# Patient Record
Sex: Female | Born: 1945
Health system: Southern US, Community
[De-identification: ages and names within clinical notes are randomized; demographics above are authoritative.]

## PROBLEM LIST (undated history)

## (undated) DIAGNOSIS — E785 Hyperlipidemia, unspecified: Secondary | ICD-10-CM

## (undated) DIAGNOSIS — J449 Chronic obstructive pulmonary disease, unspecified: Secondary | ICD-10-CM

## (undated) DIAGNOSIS — Z87891 Personal history of nicotine dependence: Secondary | ICD-10-CM

## (undated) DIAGNOSIS — K2270 Barrett's esophagus without dysplasia: Secondary | ICD-10-CM

## (undated) DIAGNOSIS — Z87730 Personal history of (corrected) cleft lip and palate: Secondary | ICD-10-CM

## (undated) DIAGNOSIS — M8580 Other specified disorders of bone density and structure, unspecified site: Secondary | ICD-10-CM

## (undated) DIAGNOSIS — K219 Gastro-esophageal reflux disease without esophagitis: Secondary | ICD-10-CM

## (undated) DIAGNOSIS — K5790 Diverticulosis of intestine, part unspecified, without perforation or abscess without bleeding: Secondary | ICD-10-CM

## (undated) DIAGNOSIS — I6529 Occlusion and stenosis of unspecified carotid artery: Secondary | ICD-10-CM

## (undated) DIAGNOSIS — E113299 Type 2 diabetes mellitus with mild nonproliferative diabetic retinopathy without macular edema, unspecified eye: Secondary | ICD-10-CM

## (undated) DIAGNOSIS — I10 Essential (primary) hypertension: Secondary | ICD-10-CM

## (undated) DIAGNOSIS — E119 Type 2 diabetes mellitus without complications: Secondary | ICD-10-CM

## (undated) DIAGNOSIS — H905 Unspecified sensorineural hearing loss: Secondary | ICD-10-CM

## (undated) DIAGNOSIS — D126 Benign neoplasm of colon, unspecified: Secondary | ICD-10-CM

## (undated) DIAGNOSIS — D128 Benign neoplasm of rectum: Secondary | ICD-10-CM

## (undated) HISTORY — DX: Diverticulosis of intestine, part unspecified, without perforation or abscess without bleeding: K57.90

## (undated) HISTORY — DX: Type 2 diabetes mellitus with mild nonproliferative diabetic retinopathy without macular edema, unspecified eye: E11.3299

## (undated) HISTORY — PX: EYE SURGERY: SHX253

## (undated) HISTORY — DX: Chronic obstructive pulmonary disease, unspecified: J44.9

## (undated) HISTORY — DX: Type 2 diabetes mellitus without complications: E11.9

## (undated) HISTORY — DX: Personal history of nicotine dependence: Z87.891

## (undated) HISTORY — PX: BLADDER SUSPENSION: SHX72

## (undated) HISTORY — DX: Unspecified sensorineural hearing loss: H90.5

## (undated) HISTORY — DX: Other specified disorders of bone density and structure, unspecified site: M85.80

## (undated) HISTORY — PX: TUBAL LIGATION: SHX77

## (undated) HISTORY — DX: Personal history of (corrected) cleft lip and palate: Z87.730

## (undated) HISTORY — DX: Benign neoplasm of rectum: D12.8

## (undated) HISTORY — PX: TONSILLECTOMY AND ADENOIDECTOMY: SUR1326

## (undated) HISTORY — DX: Occlusion and stenosis of unspecified carotid artery: I65.29

## (undated) HISTORY — DX: Hyperlipidemia, unspecified: E78.5

## (undated) HISTORY — DX: Barrett's esophagus without dysplasia: K22.70

## (undated) HISTORY — DX: Gastro-esophageal reflux disease without esophagitis: K21.9

## (undated) HISTORY — DX: Essential (primary) hypertension: I10

---

## 2004-02-10 ENCOUNTER — Ambulatory Visit: Payer: Self-pay | Admitting: Family Medicine

## 2004-09-23 DIAGNOSIS — K5790 Diverticulosis of intestine, part unspecified, without perforation or abscess without bleeding: Secondary | ICD-10-CM

## 2004-09-23 DIAGNOSIS — D128 Benign neoplasm of rectum: Secondary | ICD-10-CM

## 2004-09-23 HISTORY — DX: Diverticulosis of intestine, part unspecified, without perforation or abscess without bleeding: K57.90

## 2004-09-23 HISTORY — DX: Benign neoplasm of rectum: D12.8

## 2004-10-08 ENCOUNTER — Ambulatory Visit: Payer: Self-pay | Admitting: Internal Medicine

## 2004-10-08 HISTORY — PX: COLONOSCOPY: SHX174

## 2005-02-14 ENCOUNTER — Ambulatory Visit: Payer: Self-pay | Admitting: Family Medicine

## 2006-03-14 ENCOUNTER — Ambulatory Visit: Payer: Self-pay | Admitting: Family Medicine

## 2007-03-15 ENCOUNTER — Ambulatory Visit: Payer: Self-pay | Admitting: Family Medicine

## 2007-05-07 LAB — POTASSIUM: Potassium: 4.7 mmol/L

## 2007-05-07 LAB — GLUCOSE, FASTING: Glucose: 99

## 2007-05-07 LAB — LIPID PANEL
Cholesterol, Total: 202
Triglyceride fasting, serum: 238

## 2007-05-07 LAB — CREATININE, SERUM: Creat: 1.02

## 2008-02-19 ENCOUNTER — Emergency Department: Payer: Self-pay | Admitting: Emergency Medicine

## 2011-04-26 HISTORY — PX: CATARACT EXTRACTION: SUR2

## 2011-10-05 DIAGNOSIS — H25049 Posterior subcapsular polar age-related cataract, unspecified eye: Secondary | ICD-10-CM | POA: Diagnosis not present

## 2011-10-31 DIAGNOSIS — H25049 Posterior subcapsular polar age-related cataract, unspecified eye: Secondary | ICD-10-CM | POA: Diagnosis not present

## 2011-11-01 ENCOUNTER — Ambulatory Visit: Payer: Self-pay | Admitting: Ophthalmology

## 2011-11-01 DIAGNOSIS — Z01810 Encounter for preprocedural cardiovascular examination: Secondary | ICD-10-CM | POA: Diagnosis not present

## 2011-11-01 DIAGNOSIS — I499 Cardiac arrhythmia, unspecified: Secondary | ICD-10-CM | POA: Diagnosis not present

## 2011-11-01 DIAGNOSIS — R9431 Abnormal electrocardiogram [ECG] [EKG]: Secondary | ICD-10-CM | POA: Diagnosis not present

## 2011-11-07 ENCOUNTER — Ambulatory Visit: Payer: Self-pay | Admitting: Ophthalmology

## 2011-11-07 DIAGNOSIS — H259 Unspecified age-related cataract: Secondary | ICD-10-CM | POA: Diagnosis not present

## 2011-11-07 DIAGNOSIS — E119 Type 2 diabetes mellitus without complications: Secondary | ICD-10-CM | POA: Diagnosis not present

## 2011-11-07 DIAGNOSIS — Z79899 Other long term (current) drug therapy: Secondary | ICD-10-CM | POA: Diagnosis not present

## 2011-11-07 DIAGNOSIS — H25049 Posterior subcapsular polar age-related cataract, unspecified eye: Secondary | ICD-10-CM | POA: Diagnosis not present

## 2011-11-07 DIAGNOSIS — Z87891 Personal history of nicotine dependence: Secondary | ICD-10-CM | POA: Diagnosis not present

## 2011-11-22 ENCOUNTER — Ambulatory Visit (INDEPENDENT_AMBULATORY_CARE_PROVIDER_SITE_OTHER): Payer: Medicare Other | Admitting: Family Medicine

## 2011-11-22 ENCOUNTER — Encounter: Payer: Self-pay | Admitting: Family Medicine

## 2011-11-22 VITALS — BP 136/78 | HR 112 | Temp 98.2°F | Ht 65.0 in | Wt 123.2 lb

## 2011-11-22 DIAGNOSIS — E118 Type 2 diabetes mellitus with unspecified complications: Secondary | ICD-10-CM | POA: Insufficient documentation

## 2011-11-22 DIAGNOSIS — E1169 Type 2 diabetes mellitus with other specified complication: Secondary | ICD-10-CM | POA: Insufficient documentation

## 2011-11-22 DIAGNOSIS — F17200 Nicotine dependence, unspecified, uncomplicated: Secondary | ICD-10-CM | POA: Diagnosis not present

## 2011-11-22 DIAGNOSIS — R634 Abnormal weight loss: Secondary | ICD-10-CM

## 2011-11-22 DIAGNOSIS — E1165 Type 2 diabetes mellitus with hyperglycemia: Secondary | ICD-10-CM | POA: Insufficient documentation

## 2011-11-22 DIAGNOSIS — E785 Hyperlipidemia, unspecified: Secondary | ICD-10-CM | POA: Diagnosis not present

## 2011-11-22 LAB — POCT CBG (FASTING - GLUCOSE)-MANUAL ENTRY: Glucose Fasting, POC: 345 mg/dL — AB (ref 70–99)

## 2011-11-22 MED ORDER — METFORMIN HCL 500 MG PO TABS: 500.0000 mg | ORAL_TABLET | Freq: Two times a day (BID) | ORAL | Status: DC

## 2011-11-22 NOTE — Assessment & Plan Note (Signed)
Encouraged continued smoking cessation 

## 2011-11-22 NOTE — Progress Notes (Signed)
Subjective:    Patient ID: Cheryl Bird, female    DOB: [birth year], 66 y.o.   MRN: [id number]  HPI CC: new pt to establish  Presents with daughter, Cheryl Bird.  Prior saw [facility].  Started with medicare at age 59yo.  Recent cataract surgery last week, told sugars were 306  DM - longtime diagnosis.  Doesn't check sugars.  Last eye doctor appt was Wednesday - [place] (Recent cataract surgery).  No retinopathy.  No paresthesias.  Has glucometer at home.  Prior only on glucovance with good control, however has been off meds for quite some time.  Smoking - 1/3 ppd.  Has been cutting down.  Prior was smoking 1+ ppd.  Started smoking 50 yrs ago.  Keeps cough.  Denies SOB, trouble breathing.  Recent weight loss - over last 2 years about 30 lbs.  Hasn't been trying but has been more active, volunteering at flea market.  Not up to date on preventative care  Did have falls in last year- but attributes to recent cataract. Denies depression, anhedonia.  No h/o HTN. HLD - told to control with diet.  Preventative: Last CPE thinks 2008. Last mammo ~2008, normal Last pap smear - unsure, told always normal and could stop. Colonoscopy - done about 2008, normal.  Had polyp, but normal.  Medications and allergies reviewed and updated in chart.  Past histories reviewed and updated if relevant as below. There is no problem list on file for this patient.  Past Medical History  Diagnosis Date  . Diabetes type 2, uncontrolled   . HLD (hyperlipidemia)   . Smoker   . Right-sided sensorineural hearing loss     seen by Dr. Andee Poles   Past Surgical History  Procedure Date  . Bladder surgery     sling  . Cataract extraction 2013    Right   History  Substance Use Topics  . Smoking status: Current Everyday Smoker -- 0.3 packs/day    Types: Cigarettes  . Smokeless tobacco: Never Used  . Alcohol Use: No   Family History  Problem Relation Age of Onset  . Diabetes Maternal Aunt   .  Alcohol abuse Mother   . Heart disease Mother     enlarged heart  . Kidney failure Mother   . Hypertension Mother   . Coronary artery disease Father 26  . Coronary artery disease Brother 55  . Cancer Neg Hx   . Stroke Neg Hx    Allergies  Allergen Reactions  . Aleve (Naproxen Sodium) Itching   No current outpatient prescriptions on file prior to visit.    Review of Systems  Constitutional: Positive for unexpected weight change (weight loss). Negative for fever, chills, activity change, appetite change and fatigue.  HENT: Negative for hearing loss and neck pain.   Eyes: Negative for visual disturbance.  Respiratory: Negative for cough, chest tightness, shortness of breath and wheezing.   Cardiovascular: Negative for chest pain, palpitations and leg swelling.  Gastrointestinal: Negative for nausea, vomiting, abdominal pain, diarrhea, constipation, blood in stool and abdominal distention.  Genitourinary: Negative for hematuria and difficulty urinating.  Musculoskeletal: Negative for myalgias and arthralgias.  Skin: Negative for rash.  Neurological: Negative for dizziness, seizures, syncope and headaches.  Hematological: Does not bruise/bleed easily.  Psychiatric/Behavioral: Negative for dysphoric mood. The patient is not nervous/anxious.        Objective:   Physical Exam  Nursing note and vitals reviewed. Constitutional: She is oriented to person, place, and time. She appears well-developed  and well-nourished. No distress.  HENT:  Head: Normocephalic and atraumatic.  Right Ear: Hearing, tympanic membrane, external ear and ear canal normal.  Left Ear: Hearing, tympanic membrane, external ear and ear canal normal.  Nose: Nose normal. No mucosal edema or rhinorrhea.  Mouth/Throat: Uvula is midline, oropharynx is clear and moist and mucous membranes are normal. No oropharyngeal exudate, posterior oropharyngeal edema, posterior oropharyngeal erythema or tonsillar abscesses.  Eyes:  Conjunctivae and EOM are normal. Pupils are equal, round, and reactive to light. No scleral icterus.  Neck: Normal range of motion. Neck supple.  Cardiovascular: Normal rate, regular rhythm, normal heart sounds and intact distal pulses.   No murmur heard. Pulses:      Radial pulses are 2+ on the right side, and 2+ on the left side.  Pulmonary/Chest: Effort normal and breath sounds normal. No respiratory distress. She has no wheezes. She has no rales.  Abdominal: Soft. Bowel sounds are normal. She exhibits no distension and no mass. There is no tenderness. There is no rebound and no guarding.  Musculoskeletal: Normal range of motion.  Lymphadenopathy:    She has no cervical adenopathy.  Neurological: She is alert and oriented to person, place, and time.       CN grossly intact, station and gait intact  Skin: Skin is warm and dry. No rash noted.  Psychiatric: She has a normal mood and affect. Her behavior is normal. Judgment and thought content normal.       Assessment & Plan:

## 2011-11-22 NOTE — Assessment & Plan Note (Signed)
Weight loss of ~20 lbs in last 2 years.  No other constitutional sxs. Pt attributes to increased activity and volunteering. Return for wellness exam to review/schedule preventative care.

## 2011-11-22 NOTE — Assessment & Plan Note (Signed)
Check FLP at next fasting blood work. 

## 2011-11-22 NOTE — Patient Instructions (Addendum)
Keep working on quitting smoking. Sugar check today - your sugar is too high!  345 today. Start metformin at 500mg  nightly for 2 weeks then increase to 500mg  twice a day with meals - will cause stomach upset initially while body gets used to this. Get new battery for glucommeter and start keeping track of sugars. We will set you up with diabetes education. Return in 1 month for follow up of diabetes Return in 1-2 months for medicare wellness visit, prior fasting for blood work.

## 2011-11-22 NOTE — Assessment & Plan Note (Signed)
Chronically uncontrolled. Off meds for months to years. cbg in office 345.  Discussed this as well as dietary changes for better glycemic control.   Check A1c and BMP today. Has glucommeter at home, advised to obtain new battery. Refer to diabetes education as well for further assistance.

## 2011-11-24 LAB — BASIC METABOLIC PANEL
Calcium: 10 mg/dL (ref 8.6–10.2)
Creatinine, Ser: 0.96 mg/dL (ref 0.57–1.00)
GFR calc non Af Amer: 62 mL/min/{1.73_m2} (ref >59)
Sodium: 138 mmol/L (ref 134–144)

## 2011-11-24 LAB — HEMOGLOBIN A1C: Hgb A1c MFr Bld: 13.5 % — ABNORMAL HIGH (ref 4.8–5.6)

## 2011-11-27 ENCOUNTER — Encounter: Payer: Self-pay | Admitting: Family Medicine

## 2011-12-02 ENCOUNTER — Encounter: Payer: Self-pay | Admitting: Family Medicine

## 2011-12-15 ENCOUNTER — Other Ambulatory Visit: Payer: Self-pay | Admitting: Family Medicine

## 2011-12-15 DIAGNOSIS — E1165 Type 2 diabetes mellitus with hyperglycemia: Secondary | ICD-10-CM

## 2011-12-15 DIAGNOSIS — IMO0002 Reserved for concepts with insufficient information to code with codable children: Secondary | ICD-10-CM

## 2011-12-15 DIAGNOSIS — E785 Hyperlipidemia, unspecified: Secondary | ICD-10-CM

## 2011-12-16 ENCOUNTER — Ambulatory Visit: Payer: Self-pay | Admitting: Family Medicine

## 2011-12-16 DIAGNOSIS — E119 Type 2 diabetes mellitus without complications: Secondary | ICD-10-CM | POA: Diagnosis not present

## 2011-12-16 DIAGNOSIS — Z7189 Other specified counseling: Secondary | ICD-10-CM | POA: Diagnosis not present

## 2011-12-20 ENCOUNTER — Other Ambulatory Visit (INDEPENDENT_AMBULATORY_CARE_PROVIDER_SITE_OTHER): Payer: Medicare Other

## 2011-12-20 DIAGNOSIS — E1165 Type 2 diabetes mellitus with hyperglycemia: Secondary | ICD-10-CM

## 2011-12-20 DIAGNOSIS — E785 Hyperlipidemia, unspecified: Secondary | ICD-10-CM | POA: Diagnosis not present

## 2011-12-21 ENCOUNTER — Encounter: Payer: Self-pay | Admitting: Family Medicine

## 2011-12-21 LAB — COMPREHENSIVE METABOLIC PANEL
ALT: 8 IU/L (ref 0–32)
AST: 8 IU/L (ref 0–40)
Albumin/Globulin Ratio: 1.5 (ref 1.1–2.5)
Alkaline Phosphatase: 71 IU/L (ref 47–112)
GFR calc Af Amer: 99 mL/min/{1.73_m2} (ref >59)
GFR calc non Af Amer: 86 mL/min/{1.73_m2} (ref >59)
Potassium: 4.1 mmol/L (ref 3.5–5.2)
Sodium: 138 mmol/L (ref 134–144)
Total Bilirubin: 0.2 mg/dL (ref 0.0–1.2)

## 2011-12-21 LAB — LIPID PANEL: Chol/HDL Ratio: 4.7 ratio units — ABNORMAL HIGH (ref 0.0–4.4)

## 2011-12-21 LAB — MICROALBUMIN / CREATININE URINE RATIO
Creatinine, Ur: 96.4 mg/dL (ref 15.0–278.0)
Microalb Creat Ratio: 48.7 mg/g creat — ABNORMAL HIGH (ref 0.0–30.0)

## 2011-12-23 ENCOUNTER — Encounter: Payer: Self-pay | Admitting: Family Medicine

## 2011-12-23 ENCOUNTER — Ambulatory Visit (INDEPENDENT_AMBULATORY_CARE_PROVIDER_SITE_OTHER): Payer: Medicare Other | Admitting: Family Medicine

## 2011-12-23 ENCOUNTER — Ambulatory Visit (INDEPENDENT_AMBULATORY_CARE_PROVIDER_SITE_OTHER)
Admission: RE | Admit: 2011-12-23 | Discharge: 2011-12-23 | Disposition: A | Discharge status: Home / Self Care | Payer: Medicare Other | Source: Ambulatory Visit | Attending: Family Medicine | Admitting: Family Medicine

## 2011-12-23 ENCOUNTER — Other Ambulatory Visit (HOSPITAL_COMMUNITY)
Admission: RE | Admit: 2011-12-23 | Discharge: 2011-12-23 | Disposition: A | Discharge status: Home / Self Care | Payer: Medicare Other | Source: Ambulatory Visit | Attending: Family Medicine | Admitting: Family Medicine

## 2011-12-23 VITALS — BP 130/84 | HR 102 | Temp 99.1°F | Wt 120.2 lb

## 2011-12-23 DIAGNOSIS — Z23 Encounter for immunization: Secondary | ICD-10-CM | POA: Diagnosis not present

## 2011-12-23 DIAGNOSIS — Z0000 Encounter for general adult medical examination without abnormal findings: Secondary | ICD-10-CM | POA: Diagnosis not present

## 2011-12-23 DIAGNOSIS — Z1211 Encounter for screening for malignant neoplasm of colon: Secondary | ICD-10-CM | POA: Diagnosis not present

## 2011-12-23 DIAGNOSIS — N760 Acute vaginitis: Secondary | ICD-10-CM | POA: Diagnosis not present

## 2011-12-23 DIAGNOSIS — E785 Hyperlipidemia, unspecified: Secondary | ICD-10-CM | POA: Diagnosis not present

## 2011-12-23 DIAGNOSIS — R05 Cough: Secondary | ICD-10-CM | POA: Diagnosis not present

## 2011-12-23 DIAGNOSIS — R634 Abnormal weight loss: Secondary | ICD-10-CM

## 2011-12-23 DIAGNOSIS — Z1231 Encounter for screening mammogram for malignant neoplasm of breast: Secondary | ICD-10-CM

## 2011-12-23 DIAGNOSIS — Z124 Encounter for screening for malignant neoplasm of cervix: Secondary | ICD-10-CM | POA: Insufficient documentation

## 2011-12-23 DIAGNOSIS — F17200 Nicotine dependence, unspecified, uncomplicated: Secondary | ICD-10-CM | POA: Diagnosis not present

## 2011-12-23 DIAGNOSIS — E1165 Type 2 diabetes mellitus with hyperglycemia: Secondary | ICD-10-CM

## 2011-12-23 DIAGNOSIS — Z1151 Encounter for screening for human papillomavirus (HPV): Secondary | ICD-10-CM | POA: Diagnosis not present

## 2011-12-23 MED ORDER — LISINOPRIL 5 MG PO TABS: 5.0000 mg | ORAL_TABLET | Freq: Every day | ORAL | Status: DC

## 2011-12-23 NOTE — Patient Instructions (Addendum)
Increase metformin to 1 pill in am and 2 pills at night for 2 weeks then increase to 2 pills in am and 2 pills at night (1000mg  twice a day).  New prescription will be 1000mg  so you will just need to take one at a time. Start lisinopril at 5mg  daily - return 1 week after starting for recheck blood work. Call your insurance about the shingles shot to see if it is covered or how much it would cost and where is cheaper (here or pharmacy).  If you want to receive here, call for nurse visit. Pass by Marion's office to schedule mammogram. Tetanus and pneumonia shots today. Good to see you today, call us with questions. xray today.  Stool kit otdya.

## 2011-12-23 NOTE — Progress Notes (Signed)
Subjective:    Patient ID: Cheryl Bird, female    DOB: 1945/09/06, 66 y.o.   MRN: [id number]  HPI CC: medicare wellness visit  DM - sugars slowly coming down.  Fasting 220s (prior 300s).  Compliant with Metformin.  Last eye exam (11/2011).  Cataract surgery 10/2011.  Sees Dr. Dorcas Mcmurray.  No low sugars but doesn't feel well when sugars 170s (fatiged, weak).  Smoking - down to 1/3 ppd.  Cutting down (prior 1/2 ppd).  No depression/anhedonia, sadness. Has had 2 falls, tripped over feet.  No serious injury.  Preventative:  No CPE in 5+ yrs. Last mammo ~2008, normal  Last pap smear - unsure, told always normal and could stop.  Will check today. Colonoscopy - done about 2008, normal. Had polyp, but normal per pt.  Done at [facility]. Tetanus shot - would like today.  Last done about 10 yrs ago. Pneumonia shot - would like today. Flu shot - recommended this season. Shingles shot - discussed. Advanced directives: pt aware of this.  Has advanced directive made but not notarized.  Would want HCPOA to be daughter.  Son in Social worker is Environmental manager of finances.  Does want to stay on if reversible, otherwise doesn't want prolonged life support.  Has spoken with daughter/son in law about this.  Caffeine: 2 cups coffee/day Occupation: retired Edu: HS Activity: walks daily Diet: good water, fruits/vegetables daily  Wt Readings from Last 3 Encounters:  12/23/11 120 lb 4 oz (54.545 kg)  11/22/11 123 lb 4 oz (55.906 kg)    Medications and allergies reviewed and updated in chart.  Past histories reviewed and updated if relevant as below. Patient Active Problem List  Diagnosis  . Diabetes type 2, uncontrolled  . HLD (hyperlipidemia)  . Smoker  . Weight loss   Past Medical History  Diagnosis Date  . Diabetes type 2, uncontrolled     DMSE @ [facility] 11/2011  . HLD (hyperlipidemia)   . Smoker   . Right-sided sensorineural hearing loss     seen by Dr. Andee Poles  . HTN (hypertension)     dx per prior  records   Past Surgical History  Procedure Date  . Bladder surgery     sling  . Cataract extraction 2013    Right  . Tubal ligation   . Tonsillectomy and adenoidectomy    History  Substance Use Topics  . Smoking status: Current Everyday Smoker -- 0.3 packs/day    Types: Cigarettes  . Smokeless tobacco: Never Used  . Alcohol Use: No   Family History  Problem Relation Age of Onset  . Diabetes Maternal Aunt   . Alcohol abuse Mother   . Heart disease Mother     enlarged heart  . Kidney failure Mother   . Hypertension Mother   . Coronary artery disease Father 73  . Coronary artery disease Brother 29  . Cancer Sister     kidney   . Stroke Neg Hx    Allergies  Allergen Reactions  . Aleve (Naproxen Sodium) Itching   Current Outpatient Prescriptions on File Prior to Visit  Medication Sig Dispense Refill  . DISCONTD: metFORMIN (GLUCOPHAGE) 500 MG tablet Take 1 tablet (500 mg total) by mouth 2 (two) times daily with a meal. Start with nightly for 2 weeks then increase to bid with meal  180 tablet  3     Review of Systems  Constitutional: Positive for unexpected weight change (weight loss). Negative for fever, chills, activity change, appetite change and  fatigue.  HENT: Negative for hearing loss and neck pain.   Eyes: Negative for visual disturbance.  Respiratory: Negative for cough, chest tightness, shortness of breath and wheezing.   Cardiovascular: Negative for chest pain, palpitations and leg swelling.  Gastrointestinal: Negative for nausea, vomiting, abdominal pain, diarrhea, constipation, blood in stool and abdominal distention.  Genitourinary: Negative for hematuria and difficulty urinating.  Musculoskeletal: Negative for myalgias and arthralgias.  Skin: Negative for rash.  Neurological: Negative for dizziness, seizures, syncope and headaches.  Hematological: Does not bruise/bleed easily.  Psychiatric/Behavioral: Negative for dysphoric mood. The patient is not  nervous/anxious.        Objective:   Physical Exam  Nursing note and vitals reviewed. Constitutional: She is oriented to person, place, and time. She appears well-developed and well-nourished. No distress.  HENT:  Head: Normocephalic and atraumatic.  Right Ear: External ear normal.  Left Ear: External ear normal.  Nose: Nose normal.  Mouth/Throat: Oropharynx is clear and moist. No oropharyngeal exudate.  Eyes: Conjunctivae and EOM are normal. Pupils are equal, round, and reactive to light. No scleral icterus.  Neck: Normal range of motion. Neck supple.  Cardiovascular: Normal rate, regular rhythm, normal heart sounds and intact distal pulses.   No murmur heard. Pulses:      Radial pulses are 2+ on the right side, and 2+ on the left side.  Pulmonary/Chest: Effort normal and breath sounds normal. No respiratory distress. She has no wheezes. She has no rales. Right breast exhibits no inverted nipple, no mass, no nipple discharge, no skin change and no tenderness. Left breast exhibits no inverted nipple, no mass, no nipple discharge, no skin change and no tenderness. Breasts are symmetrical.  Abdominal: Soft. Bowel sounds are normal. She exhibits no distension and no mass. There is no tenderness. There is no rebound and no guarding.  Genitourinary: Vagina normal and uterus normal. Pelvic exam was performed with patient supine. No labial fusion. There is no rash, tenderness, lesion or injury on the right labia. There is no rash, tenderness, lesion or injury on the left labia. Uterus is not deviated, not enlarged, not fixed and not tender. Cervix exhibits no motion tenderness, no discharge and no friability. Right adnexum displays no mass, no tenderness and no fullness. Left adnexum displays no mass, no tenderness and no fullness. No erythema, tenderness or bleeding around the vagina. No foreign body (stitch present) around the vagina. No signs of injury around the vagina. No vaginal discharge found.   Musculoskeletal: Normal range of motion. She exhibits no edema.       Diabetic foot exam: Normal inspection No skin breakdown No calluses  Normal DP/PT pulses Normal sensation to light touch and monofilament Nails normal    Lymphadenopathy:    She has no cervical adenopathy.    She has no axillary adenopathy.       Right axillary: No lateral adenopathy present.       Left axillary: No lateral adenopathy present.      Right: No supraclavicular adenopathy present.       Left: No supraclavicular adenopathy present.  Neurological: She is alert and oriented to person, place, and time.       CN grossly intact, station and gait intact  Skin: Skin is warm and dry. No rash noted.  Psychiatric: She has a normal mood and affect. Her behavior is normal. Judgment and thought content normal.       Assessment & Plan:

## 2011-12-24 ENCOUNTER — Encounter: Payer: Self-pay | Admitting: Family Medicine

## 2011-12-24 DIAGNOSIS — Z0000 Encounter for general adult medical examination without abnormal findings: Secondary | ICD-10-CM | POA: Insufficient documentation

## 2011-12-24 NOTE — Assessment & Plan Note (Signed)
Uncontrolled. May need to start statin although will start 1 med at a time.

## 2011-12-24 NOTE — Assessment & Plan Note (Signed)
Continue to encourage cessation. 

## 2011-12-24 NOTE — Assessment & Plan Note (Signed)
I have personally reviewed the Medicare Annual Wellness questionnaire and have noted 1. The patient's medical and social history 2. Their use of alcohol, tobacco or illicit drugs 3. Their current medications and supplements 4. The patient's functional ability including ADL's, fall risks, home safety risks and hearing or visual impairment. 5. Diet and physical activity 6. Evidence for depression or mood disorders The patients weight, height, BMI have been recorded in the chart.  Hearing and vision has been addressed. I have made referrals, counseling and provided education to the patient based review of the above and I have provided the pt with a written personalized care plan for preventive services. See scanned questionairre. Advanced directives discussed: has discussed with children.  Daughter is HCPOA  Reviewed preventative protocols and updated unless pt declined. Td, pneumovax today. iFOB and mammo scheduled today

## 2011-12-24 NOTE — Assessment & Plan Note (Signed)
3 lb loss - continue to monitor.   Check CXR today in smoker. Sent home with iFOB.

## 2011-12-24 NOTE — Assessment & Plan Note (Signed)
Still uncontrolled.  Suprisingly, no neuropathy or retinopathy currently. Noted microalbuminuria - start lisinopril at 5mg  daily.  bp will tolerate.  rtc 1 wk after starting for Cr check. Increase metformin. Foot exam today. Pt refuses insulin currently. consider addition of januvia if able to afford, o/w sulfonylurea next. Lab Results  Component Value Date   HGBA1C 13.5* 11/22/2011

## 2011-12-25 ENCOUNTER — Ambulatory Visit: Payer: Self-pay | Admitting: Family Medicine

## 2011-12-25 DIAGNOSIS — E119 Type 2 diabetes mellitus without complications: Secondary | ICD-10-CM | POA: Diagnosis not present

## 2011-12-25 DIAGNOSIS — Z713 Dietary counseling and surveillance: Secondary | ICD-10-CM | POA: Diagnosis not present

## 2011-12-30 ENCOUNTER — Other Ambulatory Visit (INDEPENDENT_AMBULATORY_CARE_PROVIDER_SITE_OTHER): Payer: Medicare Other

## 2011-12-30 DIAGNOSIS — IMO0002 Reserved for concepts with insufficient information to code with codable children: Secondary | ICD-10-CM

## 2011-12-30 DIAGNOSIS — E1165 Type 2 diabetes mellitus with hyperglycemia: Secondary | ICD-10-CM

## 2011-12-30 LAB — BASIC METABOLIC PANEL
BUN: 17 mg/dL (ref 6–23)
Calcium: 9.5 mg/dL (ref 8.4–10.5)
Creatinine, Ser: 1 mg/dL (ref 0.4–1.2)
GFR: 62.49 mL/min (ref >60.00)

## 2011-12-31 ENCOUNTER — Other Ambulatory Visit: Payer: Self-pay | Admitting: Family Medicine

## 2011-12-31 DIAGNOSIS — R634 Abnormal weight loss: Secondary | ICD-10-CM

## 2011-12-31 DIAGNOSIS — E1165 Type 2 diabetes mellitus with hyperglycemia: Secondary | ICD-10-CM

## 2012-01-02 ENCOUNTER — Encounter: Payer: Self-pay | Admitting: Family Medicine

## 2012-01-05 ENCOUNTER — Other Ambulatory Visit (INDEPENDENT_AMBULATORY_CARE_PROVIDER_SITE_OTHER): Payer: Medicare Other

## 2012-01-05 DIAGNOSIS — R634 Abnormal weight loss: Secondary | ICD-10-CM

## 2012-01-05 DIAGNOSIS — E1165 Type 2 diabetes mellitus with hyperglycemia: Secondary | ICD-10-CM

## 2012-01-05 LAB — BASIC METABOLIC PANEL
Calcium: 9.3 mg/dL (ref 8.4–10.5)
GFR: 68.26 mL/min (ref >60.00)
Sodium: 139 mEq/L (ref 135–145)

## 2012-01-05 LAB — CBC WITH DIFFERENTIAL/PLATELET
Basophils Absolute: 0 10*3/uL (ref 0.0–0.1)
Hemoglobin: 13.4 g/dL (ref 12.0–15.0)
Lymphocytes Relative: 31.1 % (ref 12.0–46.0)
Monocytes Relative: 7.4 % (ref 3.0–12.0)
Neutro Abs: 4.2 10*3/uL (ref 1.4–7.7)
RBC: 4.71 Mil/uL (ref 3.87–5.11)
RDW: 14.3 % (ref 11.5–14.6)
WBC: 7 10*3/uL (ref 4.5–10.5)

## 2012-01-06 ENCOUNTER — Other Ambulatory Visit: Payer: Self-pay | Admitting: Family Medicine

## 2012-01-06 ENCOUNTER — Other Ambulatory Visit: Payer: Medicare Other

## 2012-01-06 DIAGNOSIS — R195 Other fecal abnormalities: Secondary | ICD-10-CM

## 2012-01-06 DIAGNOSIS — Z1211 Encounter for screening for malignant neoplasm of colon: Secondary | ICD-10-CM

## 2012-01-24 ENCOUNTER — Ambulatory Visit: Payer: Self-pay | Admitting: Family Medicine

## 2012-01-24 DIAGNOSIS — E119 Type 2 diabetes mellitus without complications: Secondary | ICD-10-CM | POA: Diagnosis not present

## 2012-01-24 DIAGNOSIS — Z7189 Other specified counseling: Secondary | ICD-10-CM | POA: Diagnosis not present

## 2012-01-24 DIAGNOSIS — E785 Hyperlipidemia, unspecified: Secondary | ICD-10-CM | POA: Diagnosis not present

## 2012-01-25 ENCOUNTER — Telehealth: Payer: Self-pay

## 2012-01-25 NOTE — Telephone Encounter (Signed)
Pts daughter left v/m [facility] [address] will not refill pt's metformin. Pt has 4 days of med left.Daughter thinks may be due to dosage increase. Was metformin 1000 mg written or sent to pharmacy. Do not see on med list. Please see pt instructions on 12/23/11 visit.Please advise.

## 2012-01-26 ENCOUNTER — Ambulatory Visit: Payer: Medicare Other

## 2012-01-26 MED ORDER — METFORMIN HCL 1000 MG PO TABS: 1000.0000 mg | ORAL_TABLET | Freq: Two times a day (BID) | ORAL | Status: DC

## 2012-01-26 NOTE — Telephone Encounter (Signed)
Rx was supposed to have been sent in for 1000 mg BID at last OV, but apparently wasn't. I sent in the refill and updated med list.

## 2012-02-02 ENCOUNTER — Telehealth: Payer: Self-pay | Admitting: Family Medicine

## 2012-02-02 MED ORDER — METFORMIN HCL 1000 MG PO TABS: 1000.0000 mg | ORAL_TABLET | Freq: Two times a day (BID) | ORAL | Status: DC

## 2012-02-02 MED ORDER — GLIMEPIRIDE 1 MG PO TABS: 1.0000 mg | ORAL_TABLET | Freq: Every day | ORAL | Status: DC

## 2012-02-02 NOTE — Telephone Encounter (Signed)
plz notify I received records from [facility] diabetes education Velna Hatchet, blood sugars are improving nicely, still a bit high, I'd like to add amaryl 1mg  once daily to regimen - sent in.  This may drop sugars so continue monitoring.

## 2012-02-03 NOTE — Telephone Encounter (Signed)
Goal fasting sugars should be between 80-120. Goal post prandial (2 hours after meal) should be <200.

## 2012-02-03 NOTE — Telephone Encounter (Signed)
Patient notified

## 2012-02-03 NOTE — Telephone Encounter (Signed)
Patient notified. She was asking what you wanted her target ranges to be when she checks her sugar.

## 2012-02-23 DIAGNOSIS — R634 Abnormal weight loss: Secondary | ICD-10-CM | POA: Diagnosis not present

## 2012-02-23 DIAGNOSIS — R195 Other fecal abnormalities: Secondary | ICD-10-CM | POA: Diagnosis not present

## 2012-02-24 ENCOUNTER — Ambulatory Visit: Payer: Self-pay | Admitting: Family Medicine

## 2012-02-24 DIAGNOSIS — K2270 Barrett's esophagus without dysplasia: Secondary | ICD-10-CM

## 2012-02-24 HISTORY — DX: Barrett's esophagus without dysplasia: K22.70

## 2012-02-24 HISTORY — PX: ESOPHAGOGASTRODUODENOSCOPY: SHX1529

## 2012-02-24 HISTORY — PX: COLONOSCOPY: SHX174

## 2012-02-28 ENCOUNTER — Encounter: Payer: Self-pay | Admitting: Family Medicine

## 2012-02-28 ENCOUNTER — Ambulatory Visit: Payer: Self-pay | Admitting: Family Medicine

## 2012-02-28 DIAGNOSIS — Z1231 Encounter for screening mammogram for malignant neoplasm of breast: Secondary | ICD-10-CM | POA: Diagnosis not present

## 2012-02-29 ENCOUNTER — Encounter: Payer: Self-pay | Admitting: *Deleted

## 2012-03-12 ENCOUNTER — Ambulatory Visit: Payer: Self-pay | Admitting: Gastroenterology

## 2012-03-12 DIAGNOSIS — E119 Type 2 diabetes mellitus without complications: Secondary | ICD-10-CM | POA: Diagnosis not present

## 2012-03-12 DIAGNOSIS — K5730 Diverticulosis of large intestine without perforation or abscess without bleeding: Secondary | ICD-10-CM | POA: Diagnosis not present

## 2012-03-12 DIAGNOSIS — K2940 Chronic atrophic gastritis without bleeding: Secondary | ICD-10-CM | POA: Diagnosis not present

## 2012-03-12 DIAGNOSIS — K921 Melena: Secondary | ICD-10-CM | POA: Diagnosis not present

## 2012-03-12 DIAGNOSIS — R195 Other fecal abnormalities: Secondary | ICD-10-CM | POA: Diagnosis not present

## 2012-03-12 DIAGNOSIS — Z9851 Tubal ligation status: Secondary | ICD-10-CM | POA: Diagnosis not present

## 2012-03-12 DIAGNOSIS — K620 Anal polyp: Secondary | ICD-10-CM | POA: Diagnosis not present

## 2012-03-12 DIAGNOSIS — Z860100 Personal history of colon polyps, unspecified: Secondary | ICD-10-CM | POA: Diagnosis not present

## 2012-03-12 DIAGNOSIS — Z79899 Other long term (current) drug therapy: Secondary | ICD-10-CM | POA: Diagnosis not present

## 2012-03-12 DIAGNOSIS — J449 Chronic obstructive pulmonary disease, unspecified: Secondary | ICD-10-CM | POA: Diagnosis not present

## 2012-03-12 DIAGNOSIS — M1990 Unspecified osteoarthritis, unspecified site: Secondary | ICD-10-CM | POA: Diagnosis not present

## 2012-03-12 DIAGNOSIS — Z9889 Other specified postprocedural states: Secondary | ICD-10-CM | POA: Diagnosis not present

## 2012-03-12 DIAGNOSIS — D126 Benign neoplasm of colon, unspecified: Secondary | ICD-10-CM | POA: Diagnosis not present

## 2012-03-12 DIAGNOSIS — K2270 Barrett's esophagus without dysplasia: Secondary | ICD-10-CM | POA: Diagnosis not present

## 2012-03-12 DIAGNOSIS — D128 Benign neoplasm of rectum: Secondary | ICD-10-CM | POA: Diagnosis not present

## 2012-03-12 DIAGNOSIS — R634 Abnormal weight loss: Secondary | ICD-10-CM | POA: Diagnosis not present

## 2012-03-18 ENCOUNTER — Encounter: Payer: Self-pay | Admitting: Family Medicine

## 2012-03-26 ENCOUNTER — Encounter: Payer: Self-pay | Admitting: Family Medicine

## 2012-03-26 ENCOUNTER — Ambulatory Visit (INDEPENDENT_AMBULATORY_CARE_PROVIDER_SITE_OTHER): Payer: Medicare Other | Admitting: Family Medicine

## 2012-03-26 VITALS — BP 112/78 | HR 82 | Temp 97.6°F | Wt 114.8 lb

## 2012-03-26 DIAGNOSIS — E1165 Type 2 diabetes mellitus with hyperglycemia: Secondary | ICD-10-CM

## 2012-03-26 DIAGNOSIS — R634 Abnormal weight loss: Secondary | ICD-10-CM

## 2012-03-26 DIAGNOSIS — Z23 Encounter for immunization: Secondary | ICD-10-CM

## 2012-03-26 DIAGNOSIS — K219 Gastro-esophageal reflux disease without esophagitis: Secondary | ICD-10-CM | POA: Diagnosis not present

## 2012-03-26 DIAGNOSIS — F17200 Nicotine dependence, unspecified, uncomplicated: Secondary | ICD-10-CM | POA: Diagnosis not present

## 2012-03-26 DIAGNOSIS — E785 Hyperlipidemia, unspecified: Secondary | ICD-10-CM | POA: Diagnosis not present

## 2012-03-26 DIAGNOSIS — IMO0002 Reserved for concepts with insufficient information to code with codable children: Secondary | ICD-10-CM

## 2012-03-26 LAB — HEMOGLOBIN A1C: Hgb A1c MFr Bld: 7.7 % — ABNORMAL HIGH (ref 4.6–6.5)

## 2012-03-26 LAB — COMPREHENSIVE METABOLIC PANEL
AST: 15 U/L (ref 0–37)
Alkaline Phosphatase: 60 U/L (ref 39–117)
Glucose, Bld: 112 mg/dL — ABNORMAL HIGH (ref 70–99)
Potassium: 4.3 mEq/L (ref 3.5–5.1)
Sodium: 137 mEq/L (ref 135–145)
Total Bilirubin: 0.5 mg/dL (ref 0.3–1.2)
Total Protein: 6.8 g/dL (ref 6.0–8.3)

## 2012-03-26 LAB — TSH: TSH: 1.65 u[IU]/mL (ref 0.35–5.50)

## 2012-03-26 MED ORDER — INFLUENZA VIRUS VACC SPLIT PF IM SUSP: 0.5000 mL | Freq: Once | INTRAMUSCULAR | Status: DC

## 2012-03-26 MED ORDER — GLUCOSE BLOOD VI STRP: ORAL_STRIP | Status: DC

## 2012-03-26 MED ORDER — ACCU-CHEK SOFTCLIX LANCETS MISC: Status: DC

## 2012-03-26 NOTE — Addendum Note (Signed)
Addended by: Criselda Peaches B on: 03/26/2012 12:56 PM   Modules accepted: Orders

## 2012-03-26 NOTE — Assessment & Plan Note (Signed)
Continued noted weight loss, although workup to date reassuring - normal CXR, CMP, CBC, TSH, mammo, colonoscopy with polyps. Discussed more aggressive w/u would include CT scan chest/abd/pelvis - she has decided to defer for now, continue to monitor weight. Discussed orexigenic - pt declines. Discussed glucerna supplementation - she will look into this but worried about cost.

## 2012-03-26 NOTE — Assessment & Plan Note (Signed)
Discussed elevated LDL. Consider starting statin next visit.

## 2012-03-26 NOTE — Assessment & Plan Note (Signed)
Continue to encourage cessation.  Only smokes a few cigarettes daily.

## 2012-03-26 NOTE — Assessment & Plan Note (Signed)
Check A1c today.  Anticipate improved control as evidenced by #s she reports.  Did not bring log today. Refilled strip and lancets.

## 2012-03-26 NOTE — Patient Instructions (Addendum)
Flu shot today. Blood work today. Good to see you, return in 3 months for follow up. Keep working on quitting smoking. We will need to start cholesterol medicine in future. Look into glucerna - this can help as a dietary supplement. Let's keep an eye on the weight.

## 2012-03-26 NOTE — Assessment & Plan Note (Signed)
Taking PPI per GI.

## 2012-03-26 NOTE — Progress Notes (Signed)
Subjective:    Patient ID: Cheryl Bird, female    DOB: 1946/01/25, 66 y.o.   MRN: [id number]  HPI CC: 29mo f/u DM  Since last seen here for AMW, underwent colonoscopy and endoscopy: COLONOSCOPY Date: 02/2012 multiple polyps - , diverticulosis, decreased rectal tone Marva Panda)  ESOPHAGOGASTRODUODENOSCOPY Date: 02/2012 irregular z line, normal stomach and duodenum  T2DM - eats 3 meals/day as well as 3 snacks daily.  AM sugar this morning 88.  Highest number was 190.  Checks bid.  Compliant with amaryl and metformin.  Requests script for lancets and strips.  On ACEI.  Last eye exam 10/2011 - to see Dr. Dellie Burns 07/2011.  Foot exam today.   Lab Results  Component Value Date   HGBA1C 13.5* 11/22/2011   HLD - not on statin.  Will need to start.  GERD? - started on presumed PPI but unsure dose.  Flu shot today.  Stays intermittently nauseated Continued weight loss noted.  Appetite ok.  No fevers/chills, night sweats.  No trouble swallowing or vomiting. Continues smoking, 2-3 cig /day Wt Readings from Last 3 Encounters:  03/26/12 114 lb 12 oz (52.05 kg)  12/23/11 120 lb 4 oz (54.545 kg)  11/22/11 123 lb 4 oz (55.906 kg)   BP Readings from Last 3 Encounters:  03/26/12 112/78  12/23/11 130/84  11/22/11 136/78   Past Medical History  Diagnosis Date  . Diabetes type 2, uncontrolled     DMSE @ [facility] 11/2011  . HLD (hyperlipidemia)   . Smoker   . Right-sided sensorineural hearing loss     seen by Dr. Andee Poles  . HTN (hypertension)     dx per prior records  . Adenomatous rectal polyp 09/2004    Maryruth Bun)  . Diverticulosis 09/2004    by colonoscopy    Past Surgical History  Procedure Date  . Bladder surgery     sling  . Cataract extraction 2013    Right  . Tubal ligation   . Tonsillectomy and adenoidectomy   . Colonoscopy 10/08/2004    rectal polyp (tubular adenoma), diverticulosis - Dr. Maryruth Bun  . Colonoscopy 02/2012    multiple polyps - , diverticulosis, decreased rectal tone  ([place])  . Esophagogastroduodenoscopy 02/2012    irregular z line, normal stomach and duodenum   Review of Systems Per HPI    Objective:   Physical Exam  Nursing note and vitals reviewed. Constitutional: She appears well-developed and well-nourished. No distress.  HENT:  Head: Normocephalic and atraumatic.  Right Ear: External ear normal.  Left Ear: External ear normal.  Nose: Nose normal.  Mouth/Throat: Oropharynx is clear and moist. No oropharyngeal exudate.  Eyes: Conjunctivae normal and EOM are normal. Pupils are equal, round, and reactive to light. No scleral icterus.  Neck: Normal range of motion. Neck supple. Carotid bruit is not present. No thyromegaly present.  Cardiovascular: Normal rate, regular rhythm, normal heart sounds and intact distal pulses.   No murmur heard. Pulmonary/Chest: Effort normal and breath sounds normal. No respiratory distress. She has no wheezes. She has no rales.  Abdominal: Soft. Bowel sounds are normal. She exhibits no distension and no mass. There is no tenderness. There is no rebound and no guarding.  Musculoskeletal: She exhibits no edema.       Diabetic foot exam: Normal inspection No skin breakdown No calluses  Normal DP/PT pulses Normal sensation to light tough and monofilament Nails normal  Lymphadenopathy:    She has no cervical adenopathy.  Skin: Skin is warm and dry.  No rash noted.  Psychiatric: She has a normal mood and affect.       Assessment & Plan:

## 2012-03-28 ENCOUNTER — Encounter: Payer: Self-pay | Admitting: *Deleted

## 2012-04-13 ENCOUNTER — Encounter: Payer: Self-pay | Admitting: Family Medicine

## 2012-04-16 DIAGNOSIS — R1013 Epigastric pain: Secondary | ICD-10-CM | POA: Diagnosis not present

## 2012-04-16 DIAGNOSIS — K2270 Barrett's esophagus without dysplasia: Secondary | ICD-10-CM | POA: Diagnosis not present

## 2012-04-16 DIAGNOSIS — K5730 Diverticulosis of large intestine without perforation or abscess without bleeding: Secondary | ICD-10-CM | POA: Diagnosis not present

## 2012-04-16 DIAGNOSIS — D126 Benign neoplasm of colon, unspecified: Secondary | ICD-10-CM | POA: Diagnosis not present

## 2012-06-18 DIAGNOSIS — Z1211 Encounter for screening for malignant neoplasm of colon: Secondary | ICD-10-CM | POA: Diagnosis not present

## 2012-06-22 ENCOUNTER — Telehealth: Payer: Self-pay

## 2012-06-22 NOTE — Telephone Encounter (Signed)
April with [employer] left v/m requesting if record request had been received. Notified by v/m request received 06/21/12 and will be picked up by [employer] on 06/26/12; after begin to process give 7-10 business days.

## 2012-06-25 ENCOUNTER — Encounter: Payer: Self-pay | Admitting: Family Medicine

## 2012-06-25 ENCOUNTER — Ambulatory Visit (INDEPENDENT_AMBULATORY_CARE_PROVIDER_SITE_OTHER): Payer: Medicare Other | Admitting: Family Medicine

## 2012-06-25 VITALS — BP 134/82 | HR 88 | Temp 97.5°F | Wt 114.2 lb

## 2012-06-25 DIAGNOSIS — R634 Abnormal weight loss: Secondary | ICD-10-CM | POA: Diagnosis not present

## 2012-06-25 DIAGNOSIS — E785 Hyperlipidemia, unspecified: Secondary | ICD-10-CM | POA: Diagnosis not present

## 2012-06-25 DIAGNOSIS — F17200 Nicotine dependence, unspecified, uncomplicated: Secondary | ICD-10-CM | POA: Diagnosis not present

## 2012-06-25 DIAGNOSIS — E1165 Type 2 diabetes mellitus with hyperglycemia: Secondary | ICD-10-CM

## 2012-06-25 MED ORDER — GLIMEPIRIDE 1 MG PO TABS: 0.5000 mg | ORAL_TABLET | Freq: Every day | ORAL | Status: DC

## 2012-06-25 NOTE — Assessment & Plan Note (Signed)
Stable. See prior workup.

## 2012-06-25 NOTE — Patient Instructions (Addendum)
Let's decrease amaryl (glimeperide) to 1/2 tablet daily. Good to see you today, good job with sugars!!  Keep up the good work. Return at your convenience for fasting blood work.  We will check A1c again and cholesterol levels - if staying elevated, we will start cholesterol medicine. Keep thinking about quitting smoking.

## 2012-06-25 NOTE — Progress Notes (Signed)
  Subjective:    Patient ID: Cheryl Bird, female    DOB: 04/27/1945, 67 y.o.   MRN: [id number]  HPI CC: 3 mo f/u  DM - fasting sugar today 68.  A few days ago blood sugar 60, felt weak and faint.  yesterday morning fasting 89.  Checks sugars twice daily.  Night time sugars 150.  Didn't bring log.  Last say eye doctor 10/2011.  Upcoming appt.  Foot exam 03/2012. Appetite good.  3 meals a day with in between snacks (peanut butter and crackers). Lab Results  Component Value Date   HGBA1C 7.7* 03/26/2012    HLD - elevated LDL last visit to 150s.    Weight loss- stable currently.  Never tried glucerna.  Smoker - 1/3 ppd.  precontemplative.  BP Readings from Last 3 Encounters:  06/25/12 134/82  03/26/12 112/78  12/23/11 130/84   Wt Readings from Last 3 Encounters:  06/25/12 114 lb 4 oz (51.823 kg)  03/26/12 114 lb 12 oz (52.05 kg)  12/23/11 120 lb 4 oz (54.545 kg)   Past Medical History  Diagnosis Date  . Diabetes type 2, uncontrolled     DMSE @ [facility] 11/2011  . HLD (hyperlipidemia)   . Smoker   . Right-sided sensorineural hearing loss     seen by Dr. Andee Poles  . HTN (hypertension)     dx per prior records  . Adenomatous rectal polyp 09/2004    Maryruth Bun)  . Diverticulosis 09/2004    by colonoscopy  . History of cleft palate   . GERD (gastroesophageal reflux disease)       Review of Systems Per HPI    Objective:   Physical Exam  Nursing note and vitals reviewed. Constitutional: She appears well-developed and well-nourished. No distress.  HENT:  Head: Normocephalic and atraumatic.  Mouth/Throat: Oropharynx is clear and moist. No oropharyngeal exudate.  Eyes: Conjunctivae and EOM are normal. Pupils are equal, round, and reactive to light. No scleral icterus.  Neck: Normal range of motion. Neck supple. Carotid bruit is not present. No thyromegaly present.  Cardiovascular: Normal rate, regular rhythm, normal heart sounds and intact distal pulses.   No murmur  heard. Pulmonary/Chest: Effort normal and breath sounds normal. No respiratory distress. She has no wheezes. She has no rales.  Musculoskeletal: She exhibits no edema.  Lymphadenopathy:    She has no cervical adenopathy.  Skin: Skin is warm and dry. No rash noted.  Psychiatric: She has a normal mood and affect.       Assessment & Plan:

## 2012-06-25 NOTE — Assessment & Plan Note (Signed)
Continue to encourage cessation. Contemplative. 

## 2012-06-25 NOTE — Addendum Note (Signed)
Addended by: Eustaquio Boyden on: 06/25/2012 01:14 PM   Modules accepted: Orders

## 2012-06-25 NOTE — Assessment & Plan Note (Signed)
Much improved control, some concern for over treatment given endorsed hypoglycemic sxs. Decrease amaryl to 0.5mg  daily. Continue metformin. Pt agrees with plan. rtc 6 mo for f/u. Check A1c when returns fasting.

## 2012-06-25 NOTE — Assessment & Plan Note (Signed)
Return fasting for blood work. If persistently elevated LDL, will start statin.

## 2012-07-06 ENCOUNTER — Other Ambulatory Visit (INDEPENDENT_AMBULATORY_CARE_PROVIDER_SITE_OTHER): Payer: Medicare Other

## 2012-07-06 ENCOUNTER — Other Ambulatory Visit: Payer: Self-pay | Admitting: Family Medicine

## 2012-07-06 DIAGNOSIS — E1165 Type 2 diabetes mellitus with hyperglycemia: Secondary | ICD-10-CM

## 2012-07-06 DIAGNOSIS — E785 Hyperlipidemia, unspecified: Secondary | ICD-10-CM

## 2012-07-06 DIAGNOSIS — IMO0002 Reserved for concepts with insufficient information to code with codable children: Secondary | ICD-10-CM

## 2012-07-06 LAB — LIPID PANEL
Cholesterol: 242 mg/dL — ABNORMAL HIGH (ref 0–200)
Total CHOL/HDL Ratio: 5

## 2012-07-06 LAB — BASIC METABOLIC PANEL
CO2: 23 mEq/L (ref 19–32)
Potassium: 4.2 mEq/L (ref 3.5–5.1)
Sodium: 139 mEq/L (ref 135–145)

## 2012-07-06 LAB — LDL CHOLESTEROL, DIRECT: Direct LDL: 154.1 mg/dL

## 2012-07-06 LAB — HEMOGLOBIN A1C: Hgb A1c MFr Bld: 6.7 % — ABNORMAL HIGH (ref 4.6–6.5)

## 2012-07-06 MED ORDER — ATORVASTATIN CALCIUM 20 MG PO TABS: 20.0000 mg | ORAL_TABLET | Freq: Every day | ORAL | Status: DC

## 2012-07-16 DIAGNOSIS — K219 Gastro-esophageal reflux disease without esophagitis: Secondary | ICD-10-CM | POA: Diagnosis not present

## 2012-07-16 DIAGNOSIS — R198 Other specified symptoms and signs involving the digestive system and abdomen: Secondary | ICD-10-CM | POA: Diagnosis not present

## 2012-07-27 DIAGNOSIS — G43109 Migraine with aura, not intractable, without status migrainosus: Secondary | ICD-10-CM | POA: Diagnosis not present

## 2012-12-28 ENCOUNTER — Other Ambulatory Visit: Payer: Self-pay | Admitting: Family Medicine

## 2013-01-04 ENCOUNTER — Encounter: Payer: Self-pay | Admitting: Family Medicine

## 2013-01-04 ENCOUNTER — Ambulatory Visit (INDEPENDENT_AMBULATORY_CARE_PROVIDER_SITE_OTHER): Payer: Medicare Other | Admitting: Family Medicine

## 2013-01-04 VITALS — BP 136/78 | HR 96 | Temp 97.3°F | Wt 116.5 lb

## 2013-01-04 DIAGNOSIS — E785 Hyperlipidemia, unspecified: Secondary | ICD-10-CM

## 2013-01-04 DIAGNOSIS — E119 Type 2 diabetes mellitus without complications: Secondary | ICD-10-CM

## 2013-01-04 DIAGNOSIS — I10 Essential (primary) hypertension: Secondary | ICD-10-CM | POA: Insufficient documentation

## 2013-01-04 DIAGNOSIS — K2270 Barrett's esophagus without dysplasia: Secondary | ICD-10-CM | POA: Diagnosis not present

## 2013-01-04 DIAGNOSIS — Z23 Encounter for immunization: Secondary | ICD-10-CM | POA: Diagnosis not present

## 2013-01-04 DIAGNOSIS — F17200 Nicotine dependence, unspecified, uncomplicated: Secondary | ICD-10-CM

## 2013-01-04 LAB — HEMOGLOBIN A1C: Hgb A1c MFr Bld: 7 % — ABNORMAL HIGH (ref 4.6–6.5)

## 2013-01-04 NOTE — Progress Notes (Signed)
  Subjective:    Patient ID: Cheryl Bird, female    DOB: 07/06/1945, 67 y.o.   MRN: [id number]  HPI CC: 6 mo f/u  DM - Checks sugars twice daily. Brings log of sugars showing fasting 80-100, PM 120-180.  Denies low sugars.  Last saw eye doctor 10/2011. Upcoming appt next month ([place]). Foot exam today.   Appetite good. 3 meals a day with in between snacks (peanut butter and crackers). Lab Results  Component Value Date   HGBA1C 6.7* 07/06/2012    Wt Readings from Last 3 Encounters:  01/04/13 116 lb 8 oz (52.844 kg)  06/25/12 114 lb 4 oz (51.823 kg)  03/26/12 114 lb 12 oz (52.05 kg)    HLD - LDL too high previosuly, lipitor 20mg  started.  HTN - stable on lisinopril 5mg  daily.  No HA, vision changes, CP/tightness, SOB, leg swelling.   Since had EGD - having some trouble GI issues.  Waking up with cramping and gassiness.  Some diarrhea.  Relieved with stooling.  Has f/u appt on 01/22/2013.  Smoker - <1/4 ppd  Flu shot today. Pneumovax done 2013. Not fasting today.  Past Medical History  Diagnosis Date  . Diabetes type 2, uncontrolled     DMSE @ [facility] 11/2011  . HLD (hyperlipidemia)   . Smoker   . Right-sided sensorineural hearing loss     seen by Dr. Andee Poles  . HTN (hypertension)     dx per prior records  . Adenomatous rectal polyp 09/2004    Maryruth Bun)  . Diverticulosis 09/2004    by colonoscopy  . History of cleft palate   . GERD (gastroesophageal reflux disease)     Past Surgical History  Procedure Laterality Date  . Bladder surgery      sling  . Cataract extraction  2013    Right  . Tubal ligation    . Tonsillectomy and adenoidectomy    . Colonoscopy  10/08/2004    rectal polyp (tubular adenoma), diverticulosis - Dr. Maryruth Bun  . Colonoscopy  02/2012    multiple polyps - tubular and sessile adenomas, diverticulosis, decreased rectal tone ([place]) rec rpt 3 yrs  . Esophagogastroduodenoscopy  02/2012    irregular z line (+barretts), normal stomach and duodenum, rpt  EGD 1 yr    Review of Systems Per HPI    Objective:   Physical Exam  Nursing note and vitals reviewed. Constitutional: She appears well-developed and well-nourished. No distress.  HENT:  Head: Normocephalic and atraumatic.  Mouth/Throat: Oropharynx is clear and moist. No oropharyngeal exudate.  Eyes: Conjunctivae and EOM are normal. Pupils are equal, round, and reactive to light. No scleral icterus.  Neck: Normal range of motion. Neck supple.  Cardiovascular: Normal rate, regular rhythm, normal heart sounds and intact distal pulses.   No murmur heard. Pulmonary/Chest: Effort normal and breath sounds normal. No respiratory distress. She has no wheezes. She has no rales.  Musculoskeletal: She exhibits no edema.  Diabetic foot exam: Normal inspection No skin breakdown No calluses  Normal DP/PT pulses Normal sensation to light tough and monofilament Nails normal  Lymphadenopathy:    She has no cervical adenopathy.  Skin: Skin is warm and dry. No rash noted.  Psychiatric: She has a normal mood and affect.       Assessment & Plan:

## 2013-01-04 NOTE — Assessment & Plan Note (Signed)
Chronic, stable.  anticipate well controlled as evidenced by log of cbg's she brings.   Continue current regimen of metformin 1000mg  twice daily and amaryl 0.5mg  daily. RTC for wellness exam. Blood work today.

## 2013-01-04 NOTE — Assessment & Plan Note (Signed)
Started on lipitor 20mg  daily - tolerating well.  Not fasting for blood work today - check when returns for [employer]

## 2013-01-04 NOTE — Assessment & Plan Note (Signed)
Reviewed daily use of PPI Has f/u scheduled with GI.

## 2013-01-04 NOTE — Assessment & Plan Note (Addendum)
Chronic. Stable on lisinopril 5mg  daily.  Continue med. BP Readings from Last 3 Encounters:  01/04/13 136/78  06/25/12 134/82  03/26/12 112/78

## 2013-01-04 NOTE — Assessment & Plan Note (Signed)
Encouraged continued cessation attempt.

## 2013-01-04 NOTE — Patient Instructions (Addendum)
Flu shot today. Blood work today to check A1c. Good to see you today, call us with questions. Return in 4-6 months for medicare wellness visit, prior fasting for blood work.

## 2013-01-07 ENCOUNTER — Encounter: Payer: Self-pay | Admitting: *Deleted

## 2013-01-22 DIAGNOSIS — K2270 Barrett's esophagus without dysplasia: Secondary | ICD-10-CM | POA: Diagnosis not present

## 2013-01-22 DIAGNOSIS — K3189 Other diseases of stomach and duodenum: Secondary | ICD-10-CM | POA: Diagnosis not present

## 2013-01-23 DIAGNOSIS — Z87891 Personal history of nicotine dependence: Secondary | ICD-10-CM

## 2013-01-23 HISTORY — DX: Personal history of nicotine dependence: Z87.891

## 2013-01-24 DIAGNOSIS — H2510 Age-related nuclear cataract, unspecified eye: Secondary | ICD-10-CM | POA: Diagnosis not present

## 2013-01-28 ENCOUNTER — Other Ambulatory Visit: Payer: Self-pay | Admitting: *Deleted

## 2013-01-28 MED ORDER — METFORMIN HCL 1000 MG PO TABS: 1000.0000 mg | ORAL_TABLET | Freq: Two times a day (BID) | ORAL | Status: DC

## 2013-01-28 MED ORDER — ATORVASTATIN CALCIUM 20 MG PO TABS: 20.0000 mg | ORAL_TABLET | Freq: Every day | ORAL | Status: DC

## 2013-02-08 ENCOUNTER — Telehealth: Payer: Self-pay | Admitting: Family Medicine

## 2013-02-08 NOTE — Telephone Encounter (Signed)
Cheryl Bird, can we call to schedule appt for Monday for f/u hypoxia?  I don't think this was done.

## 2013-02-08 NOTE — Telephone Encounter (Signed)
Appt scheduled

## 2013-02-08 NOTE — Telephone Encounter (Signed)
Received call from Dr Marva Panda - pt at endoscopy for elective EGD to f/u barrett's esophagus O2 sat found to be 88%, pt endorses some progressive dyspnea on exertion. Selena Batten can we call pt today to schedule appt with me for early next week for f/u this new onset hypoxia - and advise if any worsening difficulty breathing in interim to seek urgent care.

## 2013-02-11 ENCOUNTER — Ambulatory Visit (INDEPENDENT_AMBULATORY_CARE_PROVIDER_SITE_OTHER): Payer: Medicare Other | Admitting: Family Medicine

## 2013-02-11 ENCOUNTER — Encounter: Payer: Self-pay | Admitting: Family Medicine

## 2013-02-11 ENCOUNTER — Ambulatory Visit (INDEPENDENT_AMBULATORY_CARE_PROVIDER_SITE_OTHER)
Admission: RE | Admit: 2013-02-11 | Discharge: 2013-02-11 | Disposition: A | Discharge status: Home / Self Care | Payer: Medicare Other | Source: Ambulatory Visit | Attending: Family Medicine | Admitting: Family Medicine

## 2013-02-11 VITALS — BP 136/76 | HR 88 | Temp 98.4°F | Wt 119.5 lb

## 2013-02-11 DIAGNOSIS — R0902 Hypoxemia: Secondary | ICD-10-CM

## 2013-02-11 DIAGNOSIS — Z87891 Personal history of nicotine dependence: Secondary | ICD-10-CM | POA: Diagnosis not present

## 2013-02-11 DIAGNOSIS — J449 Chronic obstructive pulmonary disease, unspecified: Secondary | ICD-10-CM | POA: Diagnosis not present

## 2013-02-11 DIAGNOSIS — F17200 Nicotine dependence, unspecified, uncomplicated: Secondary | ICD-10-CM | POA: Diagnosis not present

## 2013-02-11 DIAGNOSIS — R05 Cough: Secondary | ICD-10-CM | POA: Diagnosis not present

## 2013-02-11 MED ORDER — ALBUTEROL SULFATE (2.5 MG/3ML) 0.083% IN NEBU
2.5000 mg | INHALATION_SOLUTION | Freq: Once | RESPIRATORY_TRACT | Status: AC
  Administered 2013-02-11: 2.5 mg via RESPIRATORY_TRACT

## 2013-02-11 MED ORDER — ALBUTEROL SULFATE HFA 108 (90 BASE) MCG/ACT IN AERS: 2.0000 | INHALATION_SPRAY | Freq: Four times a day (QID) | RESPIRATORY_TRACT | Status: DC | PRN

## 2013-02-11 NOTE — Progress Notes (Signed)
  Subjective:    Patient ID: Cheryl Bird, female    DOB: February 01, 1946, 67 y.o.   MRN: [id number]  HPI CC: recent hypoxia  Cheryl Bird is a pleasant 67 yo smoker with h/o T2DM, HLD, HTN and GERD and Barrett's who presents today after she was found to be hypoxic with O2 sat 88% on room air on presentation to outpatient center for endoscopy to monitor her Barrett's.  Dr. Marva Panda kindly called to notify me about this and I asked her to come in today for further evaluation.  Denies complaints today, denies dyspnea, wheezing.  No chest pain/tightness, palpitations. Does have chronic cough, which may be improving since she quit smoking 2 wks ago!  Prior 1/3 ppd.  On average 1 ppd since youth.  Craving still remains.  Quit cold [place].  ~45 PY hx.  Past Medical History  Diagnosis Date  . Diabetes type 2, controlled     DMSE @ [facility] 11/2011  . HLD (hyperlipidemia)   . Smoker   . Right-sided sensorineural hearing loss     seen by Dr. Andee Poles  . HTN (hypertension)     dx per prior records  . Adenomatous rectal polyp 09/2004    Maryruth Bun)  . Diverticulosis 09/2004    by colonoscopy  . History of cleft palate   . GERD (gastroesophageal reflux disease)   . Barrett esophagus 02/2012    on EGD biopsy Marva Panda)     Review of Systems Per HPI    Objective:   Physical Exam  Nursing note and vitals reviewed. Constitutional: She appears well-developed and well-nourished. No distress.  HENT:  Mouth/Throat: Oropharynx is clear and moist. No oropharyngeal exudate.  Cardiovascular: Normal rate, regular rhythm, normal heart sounds and intact distal pulses.   No murmur heard. Pulmonary/Chest: Effort normal and breath sounds normal. No respiratory distress. She has no wheezes. She has no rales.       Assessment & Plan:

## 2013-02-11 NOTE — Patient Instructions (Addendum)
Walking pulse ox today. Spirometry today. Xray today. You do have COPD - take albuterol inhaler as needed when you feel short winded.  If desired, we could start a daily medicine to help breathing. I will send today's note to Dr. Marva Panda.

## 2013-02-12 ENCOUNTER — Encounter: Payer: Self-pay | Admitting: Family Medicine

## 2013-02-12 ENCOUNTER — Other Ambulatory Visit: Payer: Self-pay | Admitting: Family Medicine

## 2013-02-12 DIAGNOSIS — J449 Chronic obstructive pulmonary disease, unspecified: Secondary | ICD-10-CM | POA: Insufficient documentation

## 2013-02-12 DIAGNOSIS — Z1231 Encounter for screening mammogram for malignant neoplasm of breast: Secondary | ICD-10-CM

## 2013-02-12 HISTORY — DX: Chronic obstructive pulmonary disease, unspecified: J44.9

## 2013-02-12 NOTE — Assessment & Plan Note (Signed)
Rhanda quit smoking cold [place] 2 wks ago! Encouraged continued cessation and congratulated on quitting.

## 2013-02-12 NOTE — Assessment & Plan Note (Signed)
Anticipate COPD related - check spirometry, CXR.

## 2013-02-12 NOTE — Assessment & Plan Note (Addendum)
Spirometry: FEV1 = 59%, post alb 78%, ratio 0.6, mod-severe obstruction, good response to bronchodilator New diagnosis of COPD stage II - responsive to bronchodilator - ?asthma. Provided with albuterol inhaler, discussed use. Discussed controller medication but pt declines currently - denies recurrent URIs or any baseline dyspnea.  Quit smoking 2 wks ago - congratulated and encouraged abstinence. CXR today - hyper inflation but no acute process. No desaturation noted on ambulatory pulse ox today - maintains O2 level in 90s throughout. I think she would be acceptable to undergo EGD assuming her O2 level is stable prior to procedure.

## 2013-03-01 ENCOUNTER — Encounter: Payer: Self-pay | Admitting: Family Medicine

## 2013-03-01 ENCOUNTER — Ambulatory Visit: Payer: Self-pay | Admitting: Family Medicine

## 2013-03-01 DIAGNOSIS — Z1231 Encounter for screening mammogram for malignant neoplasm of breast: Secondary | ICD-10-CM | POA: Diagnosis not present

## 2013-03-01 LAB — HM MAMMOGRAPHY: HM Mammogram: NEGATIVE

## 2013-03-04 ENCOUNTER — Encounter: Payer: Self-pay | Admitting: *Deleted

## 2013-04-27 ENCOUNTER — Other Ambulatory Visit: Payer: Self-pay | Admitting: Family Medicine

## 2013-04-27 DIAGNOSIS — I10 Essential (primary) hypertension: Secondary | ICD-10-CM

## 2013-04-27 DIAGNOSIS — K2270 Barrett's esophagus without dysplasia: Secondary | ICD-10-CM

## 2013-04-27 DIAGNOSIS — E119 Type 2 diabetes mellitus without complications: Secondary | ICD-10-CM

## 2013-04-27 DIAGNOSIS — E785 Hyperlipidemia, unspecified: Secondary | ICD-10-CM

## 2013-05-02 ENCOUNTER — Other Ambulatory Visit (INDEPENDENT_AMBULATORY_CARE_PROVIDER_SITE_OTHER): Payer: Medicare Other

## 2013-05-02 DIAGNOSIS — I10 Essential (primary) hypertension: Secondary | ICD-10-CM

## 2013-05-02 DIAGNOSIS — E119 Type 2 diabetes mellitus without complications: Secondary | ICD-10-CM

## 2013-05-02 DIAGNOSIS — K2270 Barrett's esophagus without dysplasia: Secondary | ICD-10-CM | POA: Diagnosis not present

## 2013-05-02 DIAGNOSIS — E785 Hyperlipidemia, unspecified: Secondary | ICD-10-CM

## 2013-05-02 LAB — BASIC METABOLIC PANEL
BUN: 19 mg/dL (ref 6–23)
CO2: 27 meq/L (ref 19–32)
Calcium: 9.8 mg/dL (ref 8.4–10.5)
Chloride: 106 mEq/L (ref 96–112)
Creatinine, Ser: 0.9 mg/dL (ref 0.4–1.2)
GFR: 63 mL/min (ref >60.00)
Glucose, Bld: 117 mg/dL — ABNORMAL HIGH (ref 70–99)
POTASSIUM: 4.2 meq/L (ref 3.5–5.1)
SODIUM: 142 meq/L (ref 135–145)

## 2013-05-02 LAB — CBC WITH DIFFERENTIAL/PLATELET
BASOS PCT: 0.5 % (ref 0.0–3.0)
Basophils Absolute: 0 10*3/uL (ref 0.0–0.1)
EOS PCT: 2.8 % (ref 0.0–5.0)
Eosinophils Absolute: 0.3 10*3/uL (ref 0.0–0.7)
HCT: 41.1 % (ref 36.0–46.0)
Hemoglobin: 13.8 g/dL (ref 12.0–15.0)
LYMPHS PCT: 29.5 % (ref 12.0–46.0)
Lymphs Abs: 2.8 10*3/uL (ref 0.7–4.0)
MCHC: 33.5 g/dL (ref 30.0–36.0)
MCV: 85.8 fl (ref 78.0–100.0)
Monocytes Absolute: 0.5 10*3/uL (ref 0.1–1.0)
Monocytes Relative: 5.5 % (ref 3.0–12.0)
NEUTROS PCT: 61.7 % (ref 43.0–77.0)
Neutro Abs: 5.8 10*3/uL (ref 1.4–7.7)
PLATELETS: 271 10*3/uL (ref 150.0–400.0)
RBC: 4.79 Mil/uL (ref 3.87–5.11)
RDW: 13.2 % (ref 11.5–14.6)
WBC: 9.3 10*3/uL (ref 4.5–10.5)

## 2013-05-02 LAB — LIPID PANEL
Cholesterol: 174 mg/dL (ref 0–200)
HDL: 58.1 mg/dL (ref >39.00)
LDL Cholesterol: 97 mg/dL (ref 0–99)
TRIGLYCERIDES: 94 mg/dL (ref 0.0–149.0)
Total CHOL/HDL Ratio: 3
VLDL: 18.8 mg/dL (ref 0.0–40.0)

## 2013-05-02 LAB — HEMOGLOBIN A1C: Hgb A1c MFr Bld: 7.1 % — ABNORMAL HIGH (ref 4.6–6.5)

## 2013-05-02 LAB — MICROALBUMIN / CREATININE URINE RATIO
CREATININE, U: 68.2 mg/dL
MICROALB UR: 2.3 mg/dL — AB (ref 0.0–1.9)
Microalb Creat Ratio: 3.4 mg/g (ref 0.0–30.0)

## 2013-05-06 ENCOUNTER — Encounter: Payer: Medicare Other | Admitting: Family Medicine

## 2013-05-13 ENCOUNTER — Ambulatory Visit (INDEPENDENT_AMBULATORY_CARE_PROVIDER_SITE_OTHER): Payer: Medicare Other | Admitting: Family Medicine

## 2013-05-13 ENCOUNTER — Encounter: Payer: Self-pay | Admitting: Family Medicine

## 2013-05-13 VITALS — BP 164/90 | HR 88 | Temp 97.9°F | Ht 65.0 in | Wt 125.5 lb

## 2013-05-13 DIAGNOSIS — E785 Hyperlipidemia, unspecified: Secondary | ICD-10-CM | POA: Diagnosis not present

## 2013-05-13 DIAGNOSIS — K2270 Barrett's esophagus without dysplasia: Secondary | ICD-10-CM

## 2013-05-13 DIAGNOSIS — Z87891 Personal history of nicotine dependence: Secondary | ICD-10-CM

## 2013-05-13 DIAGNOSIS — R634 Abnormal weight loss: Secondary | ICD-10-CM

## 2013-05-13 DIAGNOSIS — E119 Type 2 diabetes mellitus without complications: Secondary | ICD-10-CM | POA: Diagnosis not present

## 2013-05-13 DIAGNOSIS — Z0000 Encounter for general adult medical examination without abnormal findings: Secondary | ICD-10-CM | POA: Diagnosis not present

## 2013-05-13 DIAGNOSIS — I10 Essential (primary) hypertension: Secondary | ICD-10-CM | POA: Diagnosis not present

## 2013-05-13 MED ORDER — LISINOPRIL 10 MG PO TABS: 5.0000 mg | ORAL_TABLET | Freq: Every day | ORAL | Status: DC

## 2013-05-13 NOTE — Patient Instructions (Addendum)
Call Dr. Marton Redwood office about rescheduling endoscopy. Good job with your sugars recently!  Given some low fasting sugars- let's stop amaryl (glimepiride).  Watch sugars - if persistently elevated let me know for 2nd diabetes medicine. Your blood pressure is too high - increase lisinopril to 10mg  daily (2 pills until you run out then new dose will be at pharmacy).  keep an eye on blood pressure at local pharmacy and let me know if too high or too low or any low blood pressure symptoms like dizziness or lightheadedness. Good to see you today, call us with questions. Return as needed or in 3 months for follow up.

## 2013-05-13 NOTE — Assessment & Plan Note (Signed)
I've asked her to call GI to reschedule EGD. Continue PPI daily.

## 2013-05-13 NOTE — Assessment & Plan Note (Signed)
I have personally reviewed the Medicare Annual Wellness questionnaire and have noted 1. The patient's medical and social history 2. Their use of alcohol, tobacco or illicit drugs 3. Their current medications and supplements 4. The patient's functional ability including ADL's, fall risks, home safety risks and hearing or visual impairment. 5. Diet and physical activity 6. Evidence for depression or mood disorders The patients weight, height, BMI have been recorded in the chart.  Hearing and vision has been addressed. I have made referrals, counseling and provided education to the patient based review of the above and I have provided the pt with a written personalized care plan for preventive services. See scanned questionairre. Advanced directives discussed: has discussed with family, daughter is HCPOA.  Reviewed preventative protocols and updated unless pt declined. UTD immunizations. UTD cancer screenings.

## 2013-05-13 NOTE — Assessment & Plan Note (Signed)
Chronic, stable. Concern for low sugars endorsed so will discontinue sulfonylurea. Pt agrees with plan. Recheck next visit, if sugars trending up, will consider starting 2nd dm med.

## 2013-05-13 NOTE — Assessment & Plan Note (Signed)
Chronic, stable. Continue meds. 

## 2013-05-13 NOTE — Progress Notes (Signed)
Subjective:    Patient ID: Cheryl Bird, female    DOB: 06/29/1945, 68 y.o.   MRN: [id number]  HPI CC: medicare wellness, subsequent  H/o barrett's esophagus (02/2012), due for rpt EGD Gustavo Lah)  HTN - doesn't check bp at home.  Wonders why bp elevated today.  Did have salty tomato sandwich today.  Compliant with lisinopril 5mg  daily.  DM - regularly does check sugars 60 fasting, 160 at bedtime.  Compliant with antihyperglycemic regimen which includes: glimepiride 0.5mg  daily and metformin 1000mg  bid.  Fasting sugars low - at times feels dizzy and shakey with this.  Denies paresthesias. Last diabetic eye exam 01/2013.  Pneumovax: 2013.   Lab Results  Component Value Date   HGBA1C 7.1* 05/02/2013   BP Readings from Last 3 Encounters:  05/13/13 160/94  02/11/13 136/76  01/04/13 136/78   Wt Readings from Last 3 Encounters:  05/13/13 125 lb 8 oz (56.926 kg)  02/11/13 119 lb 8 oz (54.205 kg)  01/04/13 116 lb 8 oz (52.844 kg)  Body mass index is 20.88 kg/(m^2). Weight gain noted.  Failed hearing on right ear.  H/o neural hearing loss from some infection like mumps as child that left her deaf. Trouble with vision out of left eye. Denies falls/depression,anhedonia,sadness.  Preventative: mammo - 02/2013 WNL Last pap smear - 11/2011 WNL Colonoscopy 2013 - multiple polyps, rpt 3 yrs Gustavo Lah) Flu shot - 12/2012 Tetanus shot - Td 11/2011 Pneumonia shot - 11/2011 Shingles shot - declines Advanced directives: pt aware of this. Would want HCPOA to be daughter. Son in Sports coach is Therapist, sports of finances. Does want to stay on life support if reversible, otherwise doesn't want prolonged life support. Has spoken with daughter/son in law about this. Has living will/advanced directive at home but it's not notarized.  Caffeine: 2 cups coffee/day  Occupation: retired  Edu: HS  Activity: walks daily 100min  Diet: good water, fruits/vegetables daily  Medications and allergies reviewed and updated in  chart.  Past histories reviewed and updated if relevant as below. Patient Active Problem List   Diagnosis Date Noted  . COPD, moderate 02/12/2013  . Hypoxia 02/11/2013  . HTN (hypertension)   . GERD (gastroesophageal reflux disease)   . Barrett esophagus 02/24/2012  . Medicare annual wellness visit, initial 12/24/2011  . Weight loss 11/22/2011  . Diabetes type 2, controlled   . HLD (hyperlipidemia)   . Ex-smoker    Past Medical History  Diagnosis Date  . Diabetes type 2, controlled     DMSE @ [facility] 11/2011  . HLD (hyperlipidemia)   . Ex-smoker 01/2013  . Right-sided sensorineural hearing loss     seen by Dr. Pryor Ochoa  . HTN (hypertension)     dx per prior records  . Adenomatous rectal polyp 09/2004    Nicolasa Ducking)  . Diverticulosis 09/2004    by colonoscopy  . History of cleft palate   . GERD (gastroesophageal reflux disease)   . Barrett esophagus 02/2012    on EGD biopsy Gustavo Lah)  . COPD, moderate 02/12/2013    Spirometry - 01/2013: FEV1 = 59%, post alb 78%, ratio 0.6, mod severe obstruction, good response to bronchodilator    Past Surgical History  Procedure Laterality Date  . Bladder surgery      sling  . Cataract extraction  2013    Right  . Tubal ligation    . Tonsillectomy and adenoidectomy    . Colonoscopy  10/08/2004    rectal polyp (tubular adenoma), diverticulosis - Dr. Nicolasa Ducking  .  Colonoscopy  02/2012    multiple polyps - tubular and sessile adenomas, diverticulosis, decreased rectal tone ([place]) rec rpt 3 yrs  . Esophagogastroduodenoscopy  02/2012    irregular z line (+barretts), normal stomach and duodenum, rpt EGD 1 yr   History  Substance Use Topics  . Smoking status: Former Smoker -- 1.00 packs/day for 45 years    Types: Cigarettes    Start date: 04/25/1968    Quit date: 01/23/2013  . Smokeless tobacco: Never Used  . Alcohol Use: No   Family History  Problem Relation Age of Onset  . Diabetes Maternal Aunt   . Alcohol abuse Mother   . Heart disease  Mother     enlarged heart  . Kidney failure Mother   . Hypertension Mother   . Coronary artery disease Father 47  . Coronary artery disease Brother 57  . Cancer Sister     kidney   . Stroke Neg Hx    Allergies  Allergen Reactions  . Aleve [Naproxen Sodium] Itching   Current Outpatient Prescriptions on File Prior to Visit  Medication Sig Dispense Refill  . ACCU-CHEK SOFTCLIX LANCETS lancets Use as instructed  100 each  12  . albuterol (PROVENTIL HFA;VENTOLIN HFA) 108 (90 BASE) MCG/ACT inhaler Inhale 2 puffs into the lungs every 6 (six) hours as needed for wheezing.  1 Inhaler  3  . atorvastatin (LIPITOR) 20 MG tablet Take 1 tablet (20 mg total) by mouth at bedtime.  90 tablet  1  . glimepiride (AMARYL) 1 MG tablet Take 0.5 tablets (0.5 mg total) by mouth daily before breakfast.  45 tablet  3  . glucose blood (ACCU-CHEK AVIVA) test strip Use as instructed  100 each  12  . lisinopril (PRINIVIL,ZESTRIL) 5 MG tablet       . metFORMIN (GLUCOPHAGE) 1000 MG tablet Take 1 tablet (1,000 mg total) by mouth 2 (two) times daily with a meal.  180 tablet  1  . omeprazole (PRILOSEC) 20 MG capsule Take 20 mg by mouth daily.       No current facility-administered medications on file prior to visit.     Review of Systems  Constitutional: Negative for fever, chills, activity change, appetite change, fatigue and unexpected weight change.  HENT: Negative for hearing loss.   Eyes: Negative for visual disturbance.  Respiratory: Positive for cough (occasional). Negative for chest tightness, shortness of breath and wheezing.   Cardiovascular: Negative for chest pain, palpitations and leg swelling.  Gastrointestinal: Negative for nausea, vomiting, abdominal pain, diarrhea, constipation (very regular since colonoscopy), blood in stool and abdominal distention.  Genitourinary: Positive for urgency. Negative for hematuria and difficulty urinating.       Some urinary accidents endorses both urge and stress, as  well as nocturia.  Denies bother from this  Musculoskeletal: Negative for arthralgias, myalgias and neck pain.  Skin: Negative for rash.  Neurological: Negative for dizziness, seizures, syncope and headaches.  Hematological: Negative for adenopathy. Does not bruise/bleed easily.  Psychiatric/Behavioral: Negative for dysphoric mood. The patient is not nervous/anxious.        Objective:   Physical Exam  Nursing note and vitals reviewed. Constitutional: She is oriented to person, place, and time. She appears well-developed and well-nourished. No distress.  HENT:  Head: Normocephalic and atraumatic.  Right Ear: Hearing, external ear and ear canal normal.  Left Ear: Hearing, tympanic membrane, external ear and ear canal normal.  Nose: Nose normal.  Mouth/Throat: Oropharynx is clear and moist. No oropharyngeal exudate.  Chronic R TM deformity  Eyes: Conjunctivae and EOM are normal. Pupils are equal, round, and reactive to light. No scleral icterus.  Neck: Normal range of motion. Neck supple. Carotid bruit is not present.  Cardiovascular: Normal rate, regular rhythm, normal heart sounds and intact distal pulses.   No murmur heard. Pulses:      Radial pulses are 2+ on the right side, and 2+ on the left side.  Pulmonary/Chest: Effort normal and breath sounds normal. No respiratory distress. She has no wheezes. She has no rales.  Abdominal: Soft. Bowel sounds are normal. She exhibits no distension and no mass. There is no tenderness. There is no rebound and no guarding.  Musculoskeletal: Normal range of motion. She exhibits no edema.  Lymphadenopathy:       Head (right side): No submental, no submandibular, no tonsillar, no preauricular and no posterior auricular adenopathy present.       Head (left side): No submental, no submandibular, no tonsillar, no preauricular and no posterior auricular adenopathy present.    She has no cervical adenopathy.       Right: No supraclavicular adenopathy  present.       Left: No supraclavicular adenopathy present.  Neurological: She is alert and oriented to person, place, and time.  CN grossly intact, station and gait intact Unable to serial 3s, D-L-R-O-W 2/3 recall, 3/3 with cue  Skin: Skin is warm and dry. No rash noted.  Psychiatric: She has a normal mood and affect. Her behavior is normal. Judgment and thought content normal.       Assessment & Plan:

## 2013-05-13 NOTE — Assessment & Plan Note (Signed)
Rare cigarette.

## 2013-05-13 NOTE — Assessment & Plan Note (Signed)
Elevated today and again on my recheck. Increase lisinopril to 10mg  daily.

## 2013-05-13 NOTE — Assessment & Plan Note (Signed)
Actually trend up over last 6 months.

## 2013-05-13 NOTE — Progress Notes (Signed)
Pre-visit discussion using our clinic review tool. No additional management support is needed unless otherwise documented below in the visit note.  

## 2013-05-14 ENCOUNTER — Telehealth: Payer: Self-pay

## 2013-05-14 NOTE — Telephone Encounter (Signed)
Relevant patient education mailed to patient.  

## 2013-05-26 HISTORY — PX: ESOPHAGOGASTRODUODENOSCOPY: SHX1529

## 2013-05-29 ENCOUNTER — Telehealth: Payer: Self-pay | Admitting: Family Medicine

## 2013-05-29 NOTE — Telephone Encounter (Signed)
Relevant patient education mailed to patient.  

## 2013-06-03 DIAGNOSIS — K2270 Barrett's esophagus without dysplasia: Secondary | ICD-10-CM | POA: Diagnosis not present

## 2013-06-10 ENCOUNTER — Ambulatory Visit: Payer: Self-pay | Admitting: Gastroenterology

## 2013-06-10 DIAGNOSIS — E119 Type 2 diabetes mellitus without complications: Secondary | ICD-10-CM | POA: Diagnosis not present

## 2013-06-10 DIAGNOSIS — K2100 Gastro-esophageal reflux disease with esophagitis, without bleeding: Secondary | ICD-10-CM | POA: Diagnosis not present

## 2013-06-10 DIAGNOSIS — K449 Diaphragmatic hernia without obstruction or gangrene: Secondary | ICD-10-CM | POA: Diagnosis not present

## 2013-06-10 DIAGNOSIS — K2270 Barrett's esophagus without dysplasia: Secondary | ICD-10-CM | POA: Diagnosis not present

## 2013-06-12 LAB — PATHOLOGY REPORT

## 2013-06-20 ENCOUNTER — Encounter: Payer: Self-pay | Admitting: Family Medicine

## 2013-06-28 ENCOUNTER — Other Ambulatory Visit: Payer: Self-pay | Admitting: *Deleted

## 2013-06-28 MED ORDER — LISINOPRIL 10 MG PO TABS: 10.0000 mg | ORAL_TABLET | Freq: Every day | ORAL | Status: DC

## 2013-07-05 ENCOUNTER — Encounter: Payer: Self-pay | Admitting: Family Medicine

## 2013-07-11 ENCOUNTER — Encounter: Payer: Self-pay | Admitting: Family Medicine

## 2013-07-24 LAB — HM DIABETES EYE EXAM

## 2013-07-25 DIAGNOSIS — G4452 New daily persistent headache (NDPH): Secondary | ICD-10-CM | POA: Diagnosis not present

## 2013-08-12 ENCOUNTER — Ambulatory Visit: Payer: Medicare Other | Admitting: Family Medicine

## 2013-08-26 ENCOUNTER — Encounter: Payer: Self-pay | Admitting: Family Medicine

## 2013-08-26 ENCOUNTER — Ambulatory Visit (INDEPENDENT_AMBULATORY_CARE_PROVIDER_SITE_OTHER): Payer: Medicare Other | Admitting: Family Medicine

## 2013-08-26 VITALS — BP 150/86 | HR 88 | Temp 97.6°F | Wt 125.5 lb

## 2013-08-26 DIAGNOSIS — I10 Essential (primary) hypertension: Secondary | ICD-10-CM | POA: Diagnosis not present

## 2013-08-26 DIAGNOSIS — E119 Type 2 diabetes mellitus without complications: Secondary | ICD-10-CM

## 2013-08-26 DIAGNOSIS — K2270 Barrett's esophagus without dysplasia: Secondary | ICD-10-CM | POA: Diagnosis not present

## 2013-08-26 MED ORDER — LISINOPRIL 20 MG PO TABS: 20.0000 mg | ORAL_TABLET | Freq: Every day | ORAL | Status: DC

## 2013-08-26 NOTE — Assessment & Plan Note (Signed)
Anticipate good control based on recall cbg's.  Check A1c in 1-2 wks when returns for rpt Cr. Congratulated on good control to date.

## 2013-08-26 NOTE — Progress Notes (Signed)
Pre visit review using our clinic review tool, if applicable. No additional management support is needed unless otherwise documented below in the visit note. 

## 2013-08-26 NOTE — Assessment & Plan Note (Addendum)
Elevated again today - will increase lisinopril to 20mg  daily.  Recheck Cr in 1-2 wks. Unable to hear pulse when using her bp cuff so I encouraged her to buy automatic cuff if able.

## 2013-08-26 NOTE — Assessment & Plan Note (Signed)
EGD 05/2013.  Rpt due 2 yrs. Gustavo Lah)

## 2013-08-26 NOTE — Progress Notes (Signed)
BP 150/86  Pulse 88  Temp(Src) 97.6 F (36.4 C) (Oral)  Wt 125 lb 8 oz (56.926 kg)   CC: 3 mo f/u   Subjective:    Patient ID: Cheryl Bird, female    DOB: 04-22-1946, 68 y.o.   MRN: [id number]  HPI: Cheryl Bird is a 68 y.o. female presenting on 08/26/2013 for Follow-up   Wt Readings from Last 3 Encounters:  08/26/13 125 lb 8 oz (56.926 kg)  05/13/13 125 lb 8 oz (56.926 kg)  02/11/13 119 lb 8 oz (54.205 kg)    H/o barrett's esophagus (02/2012, again 05/2013), rpt EGD 2 yrs Gustavo Lah)  DM - regularly does check sugars about once a day - this morning 105.  Highest night time sugar 190.  Compliant with antihyperglycemic regimen which includes: metformin 1000mg  bid.  Denies low sugars or hypoglycemic symptoms.  This has improved from prior.  Lowest sugar 75.  Denies paresthesias. Last diabetic eye exam 07/2013, rec rpt 1 yr.  L cataract stable.  Pneumovax: 11/2011.  Prevnar: DUE but we are out of prevnar today.  Lab Results  Component Value Date   HGBA1C 7.1* 05/02/2013    HTN - Compliant with current antihypertensive regimen of lisinopril 10mg  daily.  Does not check blood pressures at home: although she has bp cuff.  No low blood pressure symptoms of dizziness/syncope.  Denies HA, vision changes, CP/tightness, SOB. Occasional leg swelling worse with prolonged standing.  BP Readings from Last 3 Encounters:  08/26/13 150/86  05/13/13 164/90  02/11/13 136/76   Lab Results  Component Value Date   CREATININE 0.9 05/02/2013     Relevant past medical, surgical, family and social history reviewed and updated as indicated.  Allergies and medications reviewed and updated. Current Outpatient Prescriptions on File Prior to Visit  Medication Sig  . ACCU-CHEK SOFTCLIX LANCETS lancets Use as instructed  . albuterol (PROVENTIL HFA;VENTOLIN HFA) 108 (90 BASE) MCG/ACT inhaler Inhale 2 puffs into the lungs every 6 (six) hours as needed for wheezing.  Marland Kitchen atorvastatin (LIPITOR) 20 MG tablet Take 1  tablet (20 mg total) by mouth at bedtime.  Marland Kitchen glucose blood (ACCU-CHEK AVIVA) test strip Use as instructed  . metFORMIN (GLUCOPHAGE) 1000 MG tablet Take 1 tablet (1,000 mg total) by mouth 2 (two) times daily with a meal.  . omeprazole (PRILOSEC) 20 MG capsule Take 20 mg by mouth daily.   No current facility-administered medications on file prior to visit.    Review of Systems Per HPI unless specifically indicated above    Objective:    BP 150/86  Pulse 88  Temp(Src) 97.6 F (36.4 C) (Oral)  Wt 125 lb 8 oz (56.926 kg)  Physical Exam  Nursing note and vitals reviewed. Constitutional: She appears well-developed and well-nourished. No distress.  HENT:  Mouth/Throat: Oropharynx is clear and moist. No oropharyngeal exudate.  Cardiovascular: Normal rate, regular rhythm, normal heart sounds and intact distal pulses.   No murmur heard. Pulmonary/Chest: Effort normal and breath sounds normal. No respiratory distress. She has no wheezes. She has no rales.  Musculoskeletal: She exhibits no edema.  Diabetic foot exam: Normal inspection No skin breakdown Callus R lateral sole Normal DP pulses Normal sensation to light touch and monofilament Nails elongated       Assessment & Plan:   Problem List Items Addressed This Visit   Diabetes type 2, controlled - Primary     Anticipate good control based on recall cbg's.  Check A1c in 1-2 wks when returns for  rpt Cr. Congratulated on good control to date.    Relevant Medications      lisinopril (PRINIVIL,ZESTRIL) tablet   Other Relevant Orders      HM DIABETES FOOT EXAM (Completed)      Hemoglobin A1c      Basic metabolic panel   HTN (hypertension)     Elevated again today - will increase lisinopril to 20mg  daily.  Recheck Cr in 1-2 wks. Unable to hear pulse when using her bp cuff so I encouraged her to buy automatic cuff if able.    Relevant Medications      lisinopril (PRINIVIL,ZESTRIL) tablet   Other Relevant Orders      Basic  metabolic panel   Barrett esophagus     EGD 05/2013.  Rpt due 2 yrs. Gustavo Lah)        Follow up plan: Return in about 4 months (around 12/27/2013), or if symptoms worsen or fail to improve, for follow up visit.

## 2013-08-26 NOTE — Patient Instructions (Addendum)
Your blood pressure is staying elevated.  I recommend increasing lisinopril to 20mg  daily (new dose at pharmacy). I also recommend buying automatic cuff to monitor at home if you're able to. Continue sugar medicine as up to now. Return in 1-2 weeks for kidney and A1c check. Return in 4 months for follow up office visit.

## 2013-09-09 ENCOUNTER — Other Ambulatory Visit (INDEPENDENT_AMBULATORY_CARE_PROVIDER_SITE_OTHER): Payer: Medicare Other

## 2013-09-09 DIAGNOSIS — E119 Type 2 diabetes mellitus without complications: Secondary | ICD-10-CM | POA: Diagnosis not present

## 2013-09-09 DIAGNOSIS — I10 Essential (primary) hypertension: Secondary | ICD-10-CM | POA: Diagnosis not present

## 2013-09-09 LAB — BASIC METABOLIC PANEL
BUN: 17 mg/dL (ref 6–23)
CO2: 27 meq/L (ref 19–32)
CREATININE: 0.8 mg/dL (ref 0.4–1.2)
Calcium: 9.3 mg/dL (ref 8.4–10.5)
Chloride: 106 mEq/L (ref 96–112)
GFR: 76.92 mL/min (ref >60.00)
Glucose, Bld: 150 mg/dL — ABNORMAL HIGH (ref 70–99)
Potassium: 4.7 mEq/L (ref 3.5–5.1)
SODIUM: 141 meq/L (ref 135–145)

## 2013-09-09 LAB — HEMOGLOBIN A1C: HEMOGLOBIN A1C: 7.7 % — AB (ref 4.6–6.5)

## 2013-09-10 ENCOUNTER — Other Ambulatory Visit: Payer: Self-pay | Admitting: Family Medicine

## 2013-09-10 MED ORDER — SITAGLIPTIN PHOSPHATE 50 MG PO TABS: 50.0000 mg | ORAL_TABLET | Freq: Every day | ORAL | Status: DC

## 2013-10-14 ENCOUNTER — Other Ambulatory Visit: Payer: Self-pay | Admitting: Family Medicine

## 2013-12-27 ENCOUNTER — Ambulatory Visit: Payer: Medicare Other | Admitting: Family Medicine

## 2014-01-09 ENCOUNTER — Ambulatory Visit (INDEPENDENT_AMBULATORY_CARE_PROVIDER_SITE_OTHER): Payer: Medicare Other | Admitting: Family Medicine

## 2014-01-09 ENCOUNTER — Ambulatory Visit (INDEPENDENT_AMBULATORY_CARE_PROVIDER_SITE_OTHER)
Admission: RE | Admit: 2014-01-09 | Discharge: 2014-01-09 | Disposition: A | Discharge status: Home / Self Care | Payer: Medicare Other | Source: Ambulatory Visit | Attending: Family Medicine | Admitting: Family Medicine

## 2014-01-09 ENCOUNTER — Encounter: Payer: Self-pay | Admitting: Family Medicine

## 2014-01-09 VITALS — BP 128/68 | HR 82 | Temp 98.1°F | Wt 117.8 lb

## 2014-01-09 DIAGNOSIS — Z87891 Personal history of nicotine dependence: Secondary | ICD-10-CM

## 2014-01-09 DIAGNOSIS — J449 Chronic obstructive pulmonary disease, unspecified: Secondary | ICD-10-CM

## 2014-01-09 DIAGNOSIS — E119 Type 2 diabetes mellitus without complications: Secondary | ICD-10-CM

## 2014-01-09 DIAGNOSIS — J4489 Other specified chronic obstructive pulmonary disease: Secondary | ICD-10-CM

## 2014-01-09 DIAGNOSIS — Z23 Encounter for immunization: Secondary | ICD-10-CM

## 2014-01-09 DIAGNOSIS — I10 Essential (primary) hypertension: Secondary | ICD-10-CM

## 2014-01-09 DIAGNOSIS — E785 Hyperlipidemia, unspecified: Secondary | ICD-10-CM

## 2014-01-09 DIAGNOSIS — J984 Other disorders of lung: Secondary | ICD-10-CM | POA: Diagnosis not present

## 2014-01-09 DIAGNOSIS — J3489 Other specified disorders of nose and nasal sinuses: Secondary | ICD-10-CM | POA: Insufficient documentation

## 2014-01-09 LAB — BASIC METABOLIC PANEL
BUN: 16 mg/dL (ref 6–23)
CALCIUM: 9.5 mg/dL (ref 8.4–10.5)
CO2: 27 mEq/L (ref 19–32)
Chloride: 107 mEq/L (ref 96–112)
Creatinine, Ser: 1 mg/dL (ref 0.4–1.2)
GFR: 60.64 mL/min (ref >60.00)
GLUCOSE: 132 mg/dL — AB (ref 70–99)
POTASSIUM: 4.7 meq/L (ref 3.5–5.1)
SODIUM: 139 meq/L (ref 135–145)

## 2014-01-09 LAB — HEMOGLOBIN A1C: HEMOGLOBIN A1C: 6.8 % — AB (ref 4.6–6.5)

## 2014-01-09 NOTE — Patient Instructions (Signed)
Flu shot and prevnar today. Increase water intake. We will check A1c today. Xray today of chest. Nose is looking ok. Return in 4 months after 1/19 for wellness visit, prior fasting for labwork.

## 2014-01-09 NOTE — Addendum Note (Signed)
Addended by: Tammi Sou on: 01/09/2014 12:07 PM   Modules accepted: Orders

## 2014-01-09 NOTE — Assessment & Plan Note (Signed)
Congratulated ,encouraged continued abstinence.

## 2014-01-09 NOTE — Assessment & Plan Note (Signed)
Chronic, stable. Continue regimen. 

## 2014-01-09 NOTE — Progress Notes (Signed)
Pre visit review using our clinic review tool, if applicable. No additional management support is needed unless otherwise documented below in the visit note. 

## 2014-01-09 NOTE — Assessment & Plan Note (Signed)
Improved on lisinopril 20mg  daily. Continue this dose.

## 2014-01-09 NOTE — Progress Notes (Signed)
BP 128/68  Pulse 82  Temp(Src) 98.1 F (36.7 C) (Oral)  Wt 117 lb 12 oz (53.411 kg)  SpO2 96%   CC: f/u and diabetic bundle  Subjective:    Patient ID: Cheryl Bird, female    DOB: 1945-06-14, 68 y.o.   MRN: [id number]  HPI: Cheryl Bird is a 68 y.o. female presenting on 01/09/2014 for Follow-up and Diabetic Bundle   DM - regularly does check sugars about once a day - this morning 85. Compliant with antihyperglycemic regimen which includes: metformin 1000mg  bid and januvia 50mg  daily. Tolerating meds well. Thinks januvia causing rhinorrhea (right side). Amaryl caused hypoglycemia. Denies low sugars or hypoglycemic symptoms. Denies paresthesias. Last diabetic eye exam 07/2013, rec rpt 1 yr. L cataract stable. Pneumovax: 11/2011. Prevnar: today.  HTN - Compliant with current antihypertensive regimen of lisinopril 10mg  daily. Does not check blood pressures at home: although she has bp cuff. No low blood pressure symptoms of dizziness/syncope. Denies HA, vision changes, CP/tightness, SOB. Occasional leg swelling worse with prolonged standing.    Lab Results  Component Value Date   LDLCALC 97 05/02/2013    Ex smoker for last year. H/o COPD by CXR. Avoids current smokers.  Wt Readings from Last 3 Encounters:  01/09/14 117 lb 12 oz (53.411 kg)  08/26/13 125 lb 8 oz (56.926 kg)  05/13/13 125 lb 8 oz (56.926 kg)   Body mass index is 19.59 kg/(m^2). Wonders if weight loss from increased working outside. Weight has fluctuated 114-125 over last 2 years. Appetite steady, eating well.   Relevant past medical, surgical, family and social history reviewed and updated as indicated.  Allergies and medications reviewed and updated. Current Outpatient Prescriptions on File Prior to Visit  Medication Sig  . ACCU-CHEK SOFTCLIX LANCETS lancets Use as instructed  . albuterol (PROVENTIL HFA;VENTOLIN HFA) 108 (90 BASE) MCG/ACT inhaler Inhale 2 puffs into the lungs every 6 (six) hours as needed for  wheezing.  Marland Kitchen atorvastatin (LIPITOR) 20 MG tablet TAKE 1 TABLET (20 MG TOTAL) BY MOUTH DAILY.  Marland Kitchen glucose blood (ACCU-CHEK AVIVA) test strip Use as instructed  . lisinopril (PRINIVIL,ZESTRIL) 20 MG tablet Take 1 tablet (20 mg total) by mouth daily.  . metFORMIN (GLUCOPHAGE) 1000 MG tablet Take 1 tablet (1,000 mg total) by mouth 2 (two) times daily with a meal.  . omeprazole (PRILOSEC) 20 MG capsule Take 20 mg by mouth daily.  . sitaGLIPtin (JANUVIA) 50 MG tablet Take 1 tablet (50 mg total) by mouth daily.   No current facility-administered medications on file prior to visit.    Review of Systems Per HPI unless specifically indicated above    Objective:    BP 128/68  Pulse 82  Temp(Src) 98.1 F (36.7 C) (Oral)  Wt 117 lb 12 oz (53.411 kg)  SpO2 96%  Physical Exam  Nursing note and vitals reviewed. Constitutional: She appears well-developed and well-nourished. No distress.  HENT:  Nose: Rhinorrhea (R sided) present. No mucosal edema.  Mouth/Throat: Oropharynx is clear and moist. No oropharyngeal exudate.  Pale boggy turbinates bilaterally, no polyps appreciated. Clear rhinorrhea out of right nare develops if she bends her head forward  Eyes: Conjunctivae and EOM are normal. Pupils are equal, round, and reactive to light. No scleral icterus.  Neck: Normal range of motion. Neck supple.  Cardiovascular: Normal rate, regular rhythm, normal heart sounds and intact distal pulses.   No murmur heard. Pulmonary/Chest: Effort normal. No respiratory distress. She has no wheezes. She has rales (R  sided).  Musculoskeletal: She exhibits no edema.  Lymphadenopathy:    She has no cervical adenopathy.   Results for orders placed in visit on 09/09/13  HEMOGLOBIN A1C      Result Value Ref Range   Hemoglobin A1C 7.7 (*) 4.6 - 6.5 %  BASIC METABOLIC PANEL      Result Value Ref Range   Sodium 141  135 - 145 mEq/L   Potassium 4.7  3.5 - 5.1 mEq/L   Chloride 106  96 - 112 mEq/L   CO2 27  19 - 32  mEq/L   Glucose, Bld 150 (*) 70 - 99 mg/dL   BUN 17  6 - 23 mg/dL   Creatinine, Ser 0.8  0.4 - 1.2 mg/dL   Calcium 9.3  8.4 - 10.5 mg/dL   GFR 76.92  >60.00 mL/min      Assessment & Plan:   Problem List Items Addressed This Visit   Rhinorrhea     Unilateral rhinorrhea that started with [place]. Not bothersome to the point of pt desiring to stop [place]. Given unilateral nature of complaint, did recommend ENT referral to r/o obstruction or other cause. Some evidence of allergic rhinitis on exam today. If unrevealing, consider INS. Actually in further discussion with patient - unsure if unilateral or bilateral. She will monitor rhinorrhea for next 1-2 weeks and call me with update - if R sided, will refer to ENT. Pt agrees with plan.    HTN (hypertension)     Improved on lisinopril 20mg  daily. Continue this dose.    HLD (hyperlipidemia)     Chronic, stable. Continue regimen.    Ex-smoker     Congratulated ,encouraged continued abstinence.    Relevant Orders      DG Chest 2 View   Diabetes type 2, controlled - Primary     Check A1c today. Continue metformin and januvia.    Relevant Orders      Hemoglobin A1c      Basic metabolic panel   COPD, moderate     Abnormal lung sounds on right - will update CXR today.    Relevant Orders      DG Chest 2 View       Follow up plan: Return in about 4 months (around 05/11/2014), or as needed, for wellness visit.

## 2014-01-09 NOTE — Assessment & Plan Note (Signed)
Check A1c today. Continue metformin and januvia.

## 2014-01-09 NOTE — Assessment & Plan Note (Addendum)
Unilateral rhinorrhea that started with [place]. Not bothersome to the point of pt desiring to stop [place]. Given unilateral nature of complaint, did recommend ENT referral to r/o obstruction or other cause. Some evidence of allergic rhinitis on exam today. If unrevealing, consider INS. Actually in further discussion with patient - unsure if unilateral or bilateral. She will monitor rhinorrhea for next 1-2 weeks and call me with update - if R sided, will refer to ENT. Pt agrees with plan.

## 2014-01-09 NOTE — Assessment & Plan Note (Signed)
Abnormal lung sounds on right - will update CXR today.

## 2014-01-13 ENCOUNTER — Encounter: Payer: Self-pay | Admitting: Family Medicine

## 2014-01-30 ENCOUNTER — Telehealth: Payer: Self-pay | Admitting: Family Medicine

## 2014-01-30 NOTE — Telephone Encounter (Signed)
error 

## 2014-02-01 ENCOUNTER — Other Ambulatory Visit: Payer: Self-pay | Admitting: Family Medicine

## 2014-03-07 ENCOUNTER — Encounter: Payer: Self-pay | Admitting: *Deleted

## 2014-03-07 ENCOUNTER — Ambulatory Visit: Payer: Self-pay | Admitting: Family Medicine

## 2014-03-07 ENCOUNTER — Encounter: Payer: Self-pay | Admitting: Family Medicine

## 2014-03-07 DIAGNOSIS — Z1231 Encounter for screening mammogram for malignant neoplasm of breast: Secondary | ICD-10-CM | POA: Diagnosis not present

## 2014-03-07 LAB — HM MAMMOGRAPHY: HM Mammogram: NORMAL

## 2014-05-11 ENCOUNTER — Other Ambulatory Visit: Payer: Self-pay | Admitting: Family Medicine

## 2014-05-11 DIAGNOSIS — I10 Essential (primary) hypertension: Secondary | ICD-10-CM

## 2014-05-11 DIAGNOSIS — K2270 Barrett's esophagus without dysplasia: Secondary | ICD-10-CM

## 2014-05-11 DIAGNOSIS — E119 Type 2 diabetes mellitus without complications: Secondary | ICD-10-CM

## 2014-05-11 DIAGNOSIS — K2100 Gastro-esophageal reflux disease with esophagitis, without bleeding: Secondary | ICD-10-CM

## 2014-05-11 DIAGNOSIS — E785 Hyperlipidemia, unspecified: Secondary | ICD-10-CM

## 2014-05-13 ENCOUNTER — Other Ambulatory Visit (INDEPENDENT_AMBULATORY_CARE_PROVIDER_SITE_OTHER): Payer: Medicare Other

## 2014-05-13 DIAGNOSIS — E119 Type 2 diabetes mellitus without complications: Secondary | ICD-10-CM | POA: Diagnosis not present

## 2014-05-13 DIAGNOSIS — E785 Hyperlipidemia, unspecified: Secondary | ICD-10-CM | POA: Diagnosis not present

## 2014-05-13 LAB — COMPREHENSIVE METABOLIC PANEL
ALT: 14 U/L (ref 0–35)
AST: 18 U/L (ref 0–37)
Albumin: 3.9 g/dL (ref 3.5–5.2)
Alkaline Phosphatase: 68 U/L (ref 39–117)
BUN: 21 mg/dL (ref 6–23)
CALCIUM: 9.8 mg/dL (ref 8.4–10.5)
CO2: 26 meq/L (ref 19–32)
Chloride: 107 mEq/L (ref 96–112)
Creatinine, Ser: 0.85 mg/dL (ref 0.40–1.20)
GFR: 70.55 mL/min (ref >60.00)
GLUCOSE: 130 mg/dL — AB (ref 70–99)
Potassium: 4.8 mEq/L (ref 3.5–5.1)
Sodium: 140 mEq/L (ref 135–145)
TOTAL PROTEIN: 6.7 g/dL (ref 6.0–8.3)
Total Bilirubin: 0.4 mg/dL (ref 0.2–1.2)

## 2014-05-13 LAB — LIPID PANEL
CHOL/HDL RATIO: 3
Cholesterol: 162 mg/dL (ref 0–200)
HDL: 47.4 mg/dL (ref >39.00)
LDL Cholesterol: 92 mg/dL (ref 0–99)
NONHDL: 114.6
TRIGLYCERIDES: 111 mg/dL (ref 0.0–149.0)
VLDL: 22.2 mg/dL (ref 0.0–40.0)

## 2014-05-13 LAB — MICROALBUMIN / CREATININE URINE RATIO
Creatinine,U: 98.5 mg/dL
MICROALB UR: 13.7 mg/dL — AB (ref 0.0–1.9)
Microalb Creat Ratio: 13.9 mg/g (ref 0.0–30.0)

## 2014-05-13 LAB — HEMOGLOBIN A1C: HEMOGLOBIN A1C: 7.2 % — AB (ref 4.6–6.5)

## 2014-05-13 LAB — TSH: TSH: 6.72 u[IU]/mL — AB (ref 0.35–4.50)

## 2014-05-20 ENCOUNTER — Encounter: Payer: Medicare Other | Admitting: Family Medicine

## 2014-05-28 ENCOUNTER — Other Ambulatory Visit (HOSPITAL_COMMUNITY)
Admission: RE | Admit: 2014-05-28 | Discharge: 2014-05-28 | Disposition: A | Discharge status: Home / Self Care | Payer: Medicare Other | Source: Ambulatory Visit | Attending: Family Medicine | Admitting: Family Medicine

## 2014-05-28 ENCOUNTER — Ambulatory Visit (INDEPENDENT_AMBULATORY_CARE_PROVIDER_SITE_OTHER)
Admission: RE | Admit: 2014-05-28 | Discharge: 2014-05-28 | Disposition: A | Discharge status: Home / Self Care | Payer: Medicare Other | Source: Ambulatory Visit | Attending: Family Medicine | Admitting: Family Medicine

## 2014-05-28 ENCOUNTER — Encounter: Payer: Self-pay | Admitting: Family Medicine

## 2014-05-28 ENCOUNTER — Ambulatory Visit (INDEPENDENT_AMBULATORY_CARE_PROVIDER_SITE_OTHER): Payer: Medicare Other | Admitting: Family Medicine

## 2014-05-28 VITALS — BP 124/72 | HR 96 | Temp 97.9°F | Ht 65.0 in | Wt 126.0 lb

## 2014-05-28 DIAGNOSIS — Z7189 Other specified counseling: Secondary | ICD-10-CM | POA: Diagnosis not present

## 2014-05-28 DIAGNOSIS — I10 Essential (primary) hypertension: Secondary | ICD-10-CM

## 2014-05-28 DIAGNOSIS — M545 Low back pain, unspecified: Secondary | ICD-10-CM | POA: Insufficient documentation

## 2014-05-28 DIAGNOSIS — J449 Chronic obstructive pulmonary disease, unspecified: Secondary | ICD-10-CM

## 2014-05-28 DIAGNOSIS — Z0000 Encounter for general adult medical examination without abnormal findings: Secondary | ICD-10-CM | POA: Diagnosis not present

## 2014-05-28 DIAGNOSIS — E119 Type 2 diabetes mellitus without complications: Secondary | ICD-10-CM

## 2014-05-28 DIAGNOSIS — Z124 Encounter for screening for malignant neoplasm of cervix: Secondary | ICD-10-CM | POA: Diagnosis not present

## 2014-05-28 DIAGNOSIS — E785 Hyperlipidemia, unspecified: Secondary | ICD-10-CM | POA: Diagnosis not present

## 2014-05-28 DIAGNOSIS — R7989 Other specified abnormal findings of blood chemistry: Secondary | ICD-10-CM | POA: Insufficient documentation

## 2014-05-28 DIAGNOSIS — M160 Bilateral primary osteoarthritis of hip: Secondary | ICD-10-CM | POA: Diagnosis not present

## 2014-05-28 NOTE — Assessment & Plan Note (Addendum)
recheck levels in 2 weeks. Endorses weight gain, mild fatigue. No constipation. Some diarrhea.

## 2014-05-28 NOTE — Assessment & Plan Note (Signed)
Chronic, stable. Continue regimen. 

## 2014-05-28 NOTE — Progress Notes (Signed)
Pre visit review using our clinic review tool, if applicable. No additional management support is needed unless otherwise documented below in the visit note. 

## 2014-05-28 NOTE — Patient Instructions (Addendum)
Your thyroid was a bit low today - return in 2 weeks for labwork to recheck thyroid Health care power of attorney form provided today (Part A). Xray of left lower back today Return in 6 months for follow up visit. Good to see you today, call us with questions.

## 2014-05-28 NOTE — Assessment & Plan Note (Addendum)
Congratulated on quitting smoking. Doesn't need albuterol. Lungs clear today.

## 2014-05-28 NOTE — Assessment & Plan Note (Signed)
Chronic, stable on lisinopril 20mg daily °

## 2014-05-28 NOTE — Progress Notes (Signed)
BP 124/72 mmHg  Pulse 96  Temp(Src) 97.9 F (36.6 C) (Oral)  Ht 5\' 5"  (1.651 m)  Wt 126 lb (57.153 kg)  BMI 20.97 kg/m2   CC: medicare wellness visit  Subjective:    Patient ID: Cheryl Bird, female    DOB: 04-02-1946, 69 y.o.   MRN: [id number]  HPI: Cheryl Bird is a 69 y.o. female presenting on 05/28/2014 for Annual Exam   Ongoing lower back pain predominantly on left side, worse with sitting on buttock. Laying back alleviates pain. No radiation down legs, no numbness or weakness of legs. Some intermittent nocturia.  Failed hearing on right ear. 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 Trouble with vision out of left eye. Eye exam done 07/2013. Denies falls/depression,anhedonia,sadness.  Preventative: COLONOSCOPY Date: 02/2012 multiple polyps - tubular and sessile adenomas, diverticulosis, decreased rectal tone ([place]) rec rpt 3 yrs mammo - 02/2014 WNL Last pap smear - 11/2011 WNL Flu shot - 12/2013 Tetanus shot - Td 11/2011 Pneumovax 8/32013, prevnar 12/2013 Shingles shot - declines Advanced directives: full code. Would want HCPOA to be daughter. Son in Sports coach is Therapist, sports of finances. Wants trial of life support of deemed reversible. Otherwise doesn't want prolonged life support. Has spoken with daughter/son in law about this. HCPOA form provided today.  Caffeine: 2 cups coffee/day  Occupation: retired  Edu: HS  Activity: walks daily 58min Diet: good water, fruits/vegetables daily  Relevant past medical, surgical, family and social history reviewed and updated as indicated. Interim medical history since our last visit reviewed. Allergies and medications reviewed and updated. Current Outpatient Prescriptions on File Prior to Visit  Medication Sig  . ACCU-CHEK SOFTCLIX LANCETS lancets Use as instructed  . albuterol (PROVENTIL HFA;VENTOLIN HFA) 108 (90 BASE) MCG/ACT inhaler Inhale 2 puffs into the lungs every 6 (six)  hours as needed for wheezing.  Marland Kitchen atorvastatin (LIPITOR) 20 MG tablet TAKE 1 TABLET (20 MG TOTAL) BY MOUTH DAILY. (Patient taking differently: TAKE 1 TABLET (20 MG TOTAL) BY MOUTH NIGHTLY)  . glucose blood (ACCU-CHEK AVIVA) test strip Use as instructed  . lisinopril (PRINIVIL,ZESTRIL) 20 MG tablet Take 1 tablet (20 mg total) by mouth daily.  . metFORMIN (GLUCOPHAGE) 1000 MG tablet TAKE 1 TABLET BY MOUTH TWICE A DAY WITH A MEAL  . omeprazole (PRILOSEC) 20 MG capsule Take 20 mg by mouth daily.  . sitaGLIPtin (JANUVIA) 50 MG tablet Take 1 tablet (50 mg total) by mouth daily.   No current facility-administered medications on file prior to visit.    Review of Systems Per HPI unless specifically indicated above     Objective:    BP 124/72 mmHg  Pulse 96  Temp(Src) 97.9 F (36.6 C) (Oral)  Ht 5\' 5"  (1.651 m)  Wt 126 lb (57.153 kg)  BMI 20.97 kg/m2  Wt Readings from Last 3 Encounters:  05/28/14 126 lb (57.153 kg)  01/09/14 117 lb 12 oz (53.411 kg)  08/26/13 125 lb 8 oz (56.926 kg)    Physical Exam  Constitutional: She is oriented to person, place, and time. She appears well-developed and well-nourished. No distress.  HENT:  Head: Normocephalic and atraumatic.  Right Ear: Hearing, tympanic membrane, external ear and ear canal normal.  Left Ear: Hearing, tympanic membrane, external ear and ear canal normal.  Nose: Nose normal.  Mouth/Throat: Uvula is midline, oropharynx is clear and moist and mucous membranes are normal. No oropharyngeal exudate, posterior oropharyngeal edema or posterior oropharyngeal  erythema.  Eyes: Conjunctivae and EOM are normal. Pupils are equal, round, and reactive to light. No scleral icterus.  Neck: Normal range of motion. Neck supple. Carotid bruit is not present. No thyromegaly present.  Cardiovascular: Normal rate, regular rhythm, normal heart sounds and intact distal pulses.   No murmur heard. Pulses:      Radial pulses are 2+ on the right side, and 2+ on  the left side.  Pulmonary/Chest: Effort normal and breath sounds normal. No respiratory distress. She has no wheezes. She has no rales.  Breast exam - deferred  Abdominal: Soft. Bowel sounds are normal. She exhibits no distension and no mass. There is no tenderness. There is no rebound and no guarding. Hernia confirmed negative in the right inguinal area and confirmed negative in the left inguinal area.  Genitourinary: Vagina normal and uterus normal. Pelvic exam was performed with patient supine. There is no rash, tenderness or lesion on the right labia. There is no rash, tenderness or lesion on the left labia. Cervix exhibits no motion tenderness and no friability. Right adnexum displays no mass, no tenderness and no fullness. Left adnexum displays no mass, no tenderness and no fullness.  Pap performed on cervix Small stitch left vaginal wall  Musculoskeletal: Normal range of motion. She exhibits no edema.  No pain midline spine No paraspinous mm tenderness Neg SLR bilaterally. No pain with int/ext rotation at hip. No pain at GTB or sciatic notch bilaterally. Point tender lateral to L SIJ  Lymphadenopathy:    She has no cervical adenopathy.       Right: No inguinal adenopathy present.       Left: No inguinal adenopathy present.  Neurological: She is alert and oriented to person, place, and time.  CN grossly intact, station and gait intact  Skin: Skin is warm and dry. No rash noted.  Psychiatric: She has a normal mood and affect. Her behavior is normal. Judgment and thought content normal.  Nursing note and vitals reviewed.  Results for orders placed or performed in visit on 05/13/14  Lipid panel  Result Value Ref Range   Cholesterol 162 0 - 200 mg/dL   Triglycerides 111.0 0.0 - 149.0 mg/dL   HDL 47.40 >39.00 mg/dL   VLDL 22.2 0.0 - 40.0 mg/dL   LDL Cholesterol 92 0 - 99 mg/dL   Total CHOL/HDL Ratio 3    NonHDL 114.60   Comprehensive metabolic panel  Result Value Ref Range    Sodium 140 135 - 145 mEq/L   Potassium 4.8 3.5 - 5.1 mEq/L   Chloride 107 96 - 112 mEq/L   CO2 26 19 - 32 mEq/L   Glucose, Bld 130 (H) 70 - 99 mg/dL   BUN 21 6 - 23 mg/dL   Creatinine, Ser 0.85 0.40 - 1.20 mg/dL   Total Bilirubin 0.4 0.2 - 1.2 mg/dL   Alkaline Phosphatase 68 39 - 117 U/L   AST 18 0 - 37 U/L   ALT 14 0 - 35 U/L   Total Protein 6.7 6.0 - 8.3 g/dL   Albumin 3.9 3.5 - 5.2 g/dL   Calcium 9.8 8.4 - 10.5 mg/dL   GFR 70.55 >60.00 mL/min  Hemoglobin A1c  Result Value Ref Range   Hgb A1c MFr Bld 7.2 (H) 4.6 - 6.5 %  Microalbumin / creatinine urine ratio  Result Value Ref Range   Microalb, Ur 13.7 (H) 0.0 - 1.9 mg/dL   Creatinine,U 98.5 mg/dL   Microalb Creat Ratio 13.9 0.0 -  30.0 mg/g  TSH  Result Value Ref Range   TSH 6.72 (H) 0.35 - 4.50 uIU/mL      Assessment & Plan:   Problem List Items Addressed This Visit    Medicare annual wellness visit, subsequent - Primary    I have personally reviewed the Medicare Annual Wellness questionnaire and have noted 1. The patient's medical and social history 2. Their use of alcohol, tobacco or illicit drugs 3. Their current medications and supplements 4. The patient's functional ability including ADL's, fall risks, home safety risks and hearing or visual impairment. 5. Diet and physical activity 6. Evidence for depression or mood disorders The patients weight, height, BMI have been recorded in the chart.  Hearing and vision has been addressed. I have made referrals, counseling and provided education to the patient based review of the above and I have provided the pt with a written personalized care plan for preventive services. Provider list updated - see scanned questionairre. Reviewed preventative protocols and updated unless pt declined.       Left low back pain    ?L sacroiliitis - check xray today. Provided with exercises from [facility] pt advisor on sacroiliac pain.      Relevant Orders   DG Pelvis 1-2 Views   HTN  (hypertension)    Chronic, stable on lisinopril 20mg  daily.      HLD (hyperlipidemia)    Chronic, stable. Continue regimen.      Diabetes type 2, controlled    Reviewed #s with patient - stable, continue current regimen of metformin and januvia.      COPD, moderate    Congratulated on quitting smoking. Doesn't need albuterol. Lungs clear today.      Advanced care planning/counseling discussion    Advanced directives: full code. Would want HCPOA to be daughter. Son in Sports coach is Therapist, sports of finances. Wants trial of life support of deemed reversible. Otherwise doesn't want prolonged life support. Has spoken with daughter/son in law about this.  HCPOA form provided today.      Abnormal TSH    recheck levels in 2 weeks. Endorses weight gain, mild fatigue. No constipation. Some diarrhea.          Follow up plan: Return in about 6 months (around 11/26/2014), or as needed, for follow up visit.

## 2014-05-28 NOTE — Assessment & Plan Note (Signed)
Reviewed #s with patient - stable, continue current regimen of metformin and januvia.

## 2014-05-28 NOTE — Assessment & Plan Note (Signed)
Advanced directives: full code. Would want HCPOA to be daughter. Son in Sports coach is Therapist, sports of finances. Wants trial of life support of deemed reversible. Otherwise doesn't want prolonged life support. Has spoken with daughter/son in law about this.  HCPOA form provided today.

## 2014-05-28 NOTE — Addendum Note (Signed)
Addended by: Royann Shivers A on: 05/28/2014 12:36 PM   Modules accepted: Orders

## 2014-05-28 NOTE — Assessment & Plan Note (Signed)

## 2014-05-28 NOTE — Assessment & Plan Note (Signed)
?  L sacroiliitis - check xray today. Provided with exercises from [facility] pt advisor on sacroiliac pain.

## 2014-05-29 ENCOUNTER — Other Ambulatory Visit: Payer: Self-pay | Admitting: *Deleted

## 2014-05-29 MED ORDER — SITAGLIPTIN PHOSPHATE 50 MG PO TABS: 50.0000 mg | ORAL_TABLET | Freq: Every day | ORAL | Status: DC

## 2014-05-30 LAB — CYTOLOGY - PAP

## 2014-05-31 ENCOUNTER — Other Ambulatory Visit: Payer: Self-pay | Admitting: Family Medicine

## 2014-06-02 ENCOUNTER — Encounter: Payer: Self-pay | Admitting: *Deleted

## 2014-06-27 ENCOUNTER — Other Ambulatory Visit: Payer: Self-pay | Admitting: *Deleted

## 2014-06-27 MED ORDER — METFORMIN HCL 1000 MG PO TABS: ORAL_TABLET | ORAL | Status: DC

## 2014-06-27 MED ORDER — SITAGLIPTIN PHOSPHATE 50 MG PO TABS: 50.0000 mg | ORAL_TABLET | Freq: Every day | ORAL | Status: DC

## 2014-06-27 MED ORDER — ATORVASTATIN CALCIUM 20 MG PO TABS: ORAL_TABLET | ORAL | Status: DC

## 2014-07-04 ENCOUNTER — Telehealth: Payer: Self-pay | Admitting: *Deleted

## 2014-07-04 NOTE — Telephone Encounter (Signed)
Form for diabetic testing supplies in your IN box for completion. 

## 2014-07-05 NOTE — Telephone Encounter (Signed)
Filled and in my outbox

## 2014-07-28 ENCOUNTER — Other Ambulatory Visit: Payer: Medicare Other

## 2014-07-29 ENCOUNTER — Other Ambulatory Visit: Payer: Self-pay | Admitting: Family Medicine

## 2014-07-29 DIAGNOSIS — E039 Hypothyroidism, unspecified: Secondary | ICD-10-CM

## 2014-07-30 ENCOUNTER — Other Ambulatory Visit (INDEPENDENT_AMBULATORY_CARE_PROVIDER_SITE_OTHER): Payer: Commercial Managed Care - HMO

## 2014-07-30 DIAGNOSIS — E039 Hypothyroidism, unspecified: Secondary | ICD-10-CM | POA: Diagnosis not present

## 2014-07-30 LAB — T4, FREE: Free T4: 0.79 ng/dL (ref 0.60–1.60)

## 2014-07-30 LAB — TSH: TSH: 3.44 u[IU]/mL (ref 0.35–4.50)

## 2014-07-31 ENCOUNTER — Encounter: Payer: Self-pay | Admitting: *Deleted

## 2014-08-12 NOTE — Op Note (Signed)
PATIENT NAME:  Cheryl Bird, Cheryl Bird MR#:  [id number] DATE OF BIRTH:  1945/06/12  DATE OF PROCEDURE:  11/07/2011  PREOPERATIVE DIAGNOSIS:  Cataract, right eye.   POSTOPERATIVE DIAGNOSIS:  Cataract, right eye.  PROCEDURE PERFORMED:  Extracapsular cataract extraction using phacoemulsification with placement of an Alcon [id number], 23.5-diopter posterior chamber lens, serial # [id number].  SURGEON:  Cheryl Back. Lashonta Pilling, MD  ASSISTANT:  None.  ANESTHESIA:  4% lidocaine and 0.75% Marcaine in a 50/50 mixture with 10 units/mL of Vitrase added, given as a peribulbar.   ANESTHESIOLOGIST:  Dr. Bjorn Loser  COMPLICATIONS:  None.  ESTIMATED BLOOD LOSS:  Less than 1 ml.  DESCRIPTION OF PROCEDURE:  The patient was brought to the operating room and given a peribulbar block.  The patient was then prepped and draped in the usual fashion.  The vertical rectus muscles were imbricated using 5-0 silk sutures.  These sutures were then clamped to the sterile drapes as bridle sutures.  A limbal peritomy was performed extending two clock hours and hemostasis was obtained with cautery.  A partial thickness scleral groove was made at the surgical limbus and dissected anteriorly in a lamellar dissection using an Alcon crescent knife.  The anterior chamber was entered superonasally with a Superblade and through the lamellar dissection with a 2.6 mm keratome.  DisCoVisc was used to replace the aqueous and a continuous tear capsulorrhexis was carried out.  Hydrodissection and hydrodelineation were carried out with balanced salt and a 27 gauge canula.  The nucleus was rotated to confirm the effectiveness of the hydrodissection.  Phacoemulsification was carried out using a divide-and-conquer technique.  Total ultrasound time was 2 minutes and 1 second with an average power of 23.6 percent for CDE of 43.91.  Irrigation/aspiration was used to remove the residual cortex.  DisCoVisc was used to inflate the capsule and the internal  incision was enlarged to 3 mm with the crescent knife.  The intraocular lens was folded and inserted into the capsular bag using the AcrySert delivery system. Irrigation/aspiration was used to remove the residual DisCoVisc.  Miostat was injected into the anterior chamber through the paracentesis track to inflate the anterior chamber and induce miosis.  The wound was checked for leaks and none were found. The conjunctiva was closed with cautery and the bridle sutures were removed.  Two drops of 0.3% Vigamox were placed on the eye.   An eye shield was placed on the eye.  The patient was discharged to the recovery room in good condition.  ____________________________ Cheryl Back Cheryl Duclos, MD sad:slb D: 11/07/2011 13:56:23 ET T: 11/07/2011 14:14:57 ET JOB#: [id number]  cc: Remo Lipps A. Zalyn Amend, MD, <Dictator> Martie Lee MD ELECTRONICALLY SIGNED 11/14/2011 13:22

## 2014-08-17 ENCOUNTER — Other Ambulatory Visit: Payer: Self-pay | Admitting: Family Medicine

## 2014-08-28 LAB — HM DIABETES EYE EXAM

## 2014-08-30 ENCOUNTER — Encounter: Payer: Self-pay | Admitting: Family Medicine

## 2014-11-28 ENCOUNTER — Encounter: Payer: Self-pay | Admitting: Family Medicine

## 2014-11-28 ENCOUNTER — Ambulatory Visit (INDEPENDENT_AMBULATORY_CARE_PROVIDER_SITE_OTHER): Payer: Commercial Managed Care - HMO | Admitting: Family Medicine

## 2014-11-28 VITALS — BP 130/76 | HR 88 | Temp 97.8°F | Wt 128.5 lb

## 2014-11-28 DIAGNOSIS — I10 Essential (primary) hypertension: Secondary | ICD-10-CM

## 2014-11-28 DIAGNOSIS — R300 Dysuria: Secondary | ICD-10-CM

## 2014-11-28 DIAGNOSIS — E785 Hyperlipidemia, unspecified: Secondary | ICD-10-CM | POA: Diagnosis not present

## 2014-11-28 DIAGNOSIS — E119 Type 2 diabetes mellitus without complications: Secondary | ICD-10-CM

## 2014-11-28 DIAGNOSIS — N390 Urinary tract infection, site not specified: Secondary | ICD-10-CM | POA: Insufficient documentation

## 2014-11-28 DIAGNOSIS — K2100 Gastro-esophageal reflux disease with esophagitis, without bleeding: Secondary | ICD-10-CM

## 2014-11-28 DIAGNOSIS — K2270 Barrett's esophagus without dysplasia: Secondary | ICD-10-CM

## 2014-11-28 LAB — BASIC METABOLIC PANEL
BUN: 20 mg/dL (ref 6–23)
CHLORIDE: 103 meq/L (ref 96–112)
CO2: 29 mEq/L (ref 19–32)
CREATININE: 0.9 mg/dL (ref 0.40–1.20)
Calcium: 9.8 mg/dL (ref 8.4–10.5)
GFR: 65.94 mL/min (ref >60.00)
Glucose, Bld: 75 mg/dL (ref 70–99)
Potassium: 4.2 mEq/L (ref 3.5–5.1)
Sodium: 140 mEq/L (ref 135–145)

## 2014-11-28 LAB — POCT URINALYSIS DIPSTICK
BILIRUBIN UA: NEGATIVE
Blood, UA: NEGATIVE
Glucose, UA: NEGATIVE
Ketones, UA: NEGATIVE
Nitrite, UA: POSITIVE
Protein, UA: NEGATIVE
SPEC GRAV UA: 1.02
Urobilinogen, UA: 0.2
pH, UA: 5.5

## 2014-11-28 LAB — HEMOGLOBIN A1C: Hgb A1c MFr Bld: 6.5 % (ref 4.6–6.5)

## 2014-11-28 MED ORDER — CEPHALEXIN 500 MG PO CAPS: 500.0000 mg | ORAL_CAPSULE | Freq: Two times a day (BID) | ORAL | Status: DC

## 2014-11-28 NOTE — Patient Instructions (Addendum)
Good to see you today, I think you are doing very well! Let's check urine for infection. labwork today Let me know when you're running low on omeprazole for a change in treatment to see if diarrhea improves.  Call marion at our office when closer to colonoscopy to ask about approval # for [facility]. Return as needed or in 6 months for medicare wellness visit.

## 2014-11-28 NOTE — Assessment & Plan Note (Addendum)
Check UA today to eval fur UTI. UA/micro suspicious for this. Will treat with keflex course. Lab Results  Component Value Date   CREATININE 0.85 05/13/2014

## 2014-11-28 NOTE — Assessment & Plan Note (Signed)
Continue omeprazole 20mg  daily. Pt worried this may be causing diarrhea - so will call us when running low on med to change to different PPI.

## 2014-11-28 NOTE — Assessment & Plan Note (Signed)
Chronic, stable. Continue lipitor 20mg daily. 

## 2014-11-28 NOTE — Assessment & Plan Note (Signed)
Chronic, stable. Continue current regimen. Foot exam today. Check labs today.

## 2014-11-28 NOTE — Progress Notes (Signed)
Pre visit review using our clinic review tool, if applicable. No additional management support is needed unless otherwise documented below in the visit note. 

## 2014-11-28 NOTE — Addendum Note (Signed)
Addended by: Royann Shivers A on: 11/28/2014 11:44 AM   Modules accepted: Orders

## 2014-11-28 NOTE — Assessment & Plan Note (Signed)
Continues PPI daily.  

## 2014-11-28 NOTE — Assessment & Plan Note (Signed)
Chronic, stable. Continue current regimen. 

## 2014-11-28 NOTE — Progress Notes (Addendum)
BP 130/76 mmHg  Pulse 88  Temp(Src) 97.8 F (36.6 C) (Oral)  Wt 128 lb 8 oz (58.287 kg)   CC: 75mo f/u visit  Subjective:    Patient ID: Cheryl Bird, female    DOB: 24-Oct-1945, 69 y.o.   MRN: [id number]  HPI: Cheryl Bird is a 69 y.o. female presenting on 11/28/2014 for Follow-up   DM - regularly does check sugars fasting 100, post prandial 150. Compliant with antihyperglycemic regimen which includes: metformin 1000mg  bid and januvia 50mg  daily.  Denies low sugars or hypoglycemic symptoms.  Denies paresthesias. Last diabetic eye exam 08/2014.  Pneumovax: 2013.  Prevnar: 2015. Foot exam today. Intermittent diarrhea.  Lab Results  Component Value Date   HGBA1C 7.2* 05/13/2014   Diabetic Foot Exam - Simple   Simple Foot Form  Diabetic Foot exam was performed with the following findings:  Yes 11/28/2014 10:58 AM  Visual Inspection  No deformities, no ulcerations, no other skin breakdown bilaterally:  Yes  Sensation Testing  Intact to touch and monofilament testing bilaterally:  Yes  Pulse Check  Posterior Tibialis and Dorsalis pulse intact bilaterally:  Yes  Comments       HTN - Compliant with current antihypertensive regimen of lisinopril 20mg  daily.  Does not check blood pressures at home.  No low blood pressure readings or symptoms of dizziness/syncope.  Denies HA, vision changes, CP/tightness, SOB, leg swelling.    HLD - compliant with lipitor 20mg  daily. Tolerating well.  GERD with h/o barrett's esophagus - compliant with omeprazole 20mg  daily. Bothersome diarrhea nightly started when she started PPI. Interested in different PPI but will let me know when running low on med.  Intermittent dysuria, urgency ongoing for last few days. No fevers/chills, rash, abd pain, flank pain. No hematuria.   Relevant past medical, surgical, family and social history reviewed and updated as indicated. Interim medical history since our last visit reviewed. Allergies and medications  reviewed and updated. Current Outpatient Prescriptions on File Prior to Visit  Medication Sig  . ACCU-CHEK SOFTCLIX LANCETS lancets Use as instructed  . albuterol (PROVENTIL HFA;VENTOLIN HFA) 108 (90 BASE) MCG/ACT inhaler Inhale 2 puffs into the lungs every 6 (six) hours as needed for wheezing.  Marland Kitchen atorvastatin (LIPITOR) 20 MG tablet TAKE 1 TABLET (20 MG TOTAL) BY MOUTH NIGHTLY  . glucose blood (ACCU-CHEK AVIVA) test strip Use as instructed  . lisinopril (PRINIVIL,ZESTRIL) 20 MG tablet TAKE 1 TABLET (20 MG TOTAL) BY MOUTH DAILY.  . metFORMIN (GLUCOPHAGE) 1000 MG tablet TAKE 1 TABLET BY MOUTH TWICE A DAY WITH A MEAL  . omeprazole (PRILOSEC) 20 MG capsule Take 20 mg by mouth daily.  . sitaGLIPtin (JANUVIA) 50 MG tablet Take 1 tablet (50 mg total) by mouth daily.   No current facility-administered medications on file prior to visit.    Review of Systems Per HPI unless specifically indicated above     Objective:    BP 130/76 mmHg  Pulse 88  Temp(Src) 97.8 F (36.6 C) (Oral)  Wt 128 lb 8 oz (58.287 kg)  Wt Readings from Last 3 Encounters:  11/28/14 128 lb 8 oz (58.287 kg)  05/28/14 126 lb (57.153 kg)  01/09/14 117 lb 12 oz (53.411 kg)    Physical Exam  Constitutional: She appears well-developed and well-nourished. No distress.  HENT:  Head: Normocephalic and atraumatic.  Right Ear: External ear normal.  Left Ear: External ear normal.  Nose: Nose normal.  Mouth/Throat: Oropharynx is clear and moist. No oropharyngeal exudate.  Eyes: Conjunctivae and EOM are normal. Pupils are equal, round, and reactive to light. No scleral icterus.  Neck: Normal range of motion. Neck supple.  Cardiovascular: Normal rate, regular rhythm, normal heart sounds and intact distal pulses.   No murmur heard. Pulmonary/Chest: Effort normal and breath sounds normal. No respiratory distress. She has no wheezes. She has no rales.  Abdominal: Soft. Bowel sounds are normal. She exhibits no distension and no  mass. There is no tenderness. There is no rebound, no guarding and no CVA tenderness.  Musculoskeletal: She exhibits no edema.  See HPI for foot exam if done  Lymphadenopathy:    She has no cervical adenopathy.  Skin: Skin is warm and dry. No rash noted.  Psychiatric: She has a normal mood and affect.  Nursing note and vitals reviewed.     Assessment & Plan:   Problem List Items Addressed This Visit    Barrett esophagus    Continues PPI daily.      Diabetes type 2, controlled - Primary    Chronic, stable. Continue current regimen. Foot exam today. Check labs today.      Relevant Orders   Basic metabolic panel   Hemoglobin A1c   Dysuria    Check UA today to eval fur UTI. UA/micro suspicious for this. Will treat with keflex course. Lab Results  Component Value Date   CREATININE 0.85 05/13/2014        Relevant Orders   POCT Urinalysis Dipstick (Completed)   GERD (gastroesophageal reflux disease)    Continue omeprazole 20mg  daily. Pt worried this may be causing diarrhea - so will call us when running low on med to change to different PPI.       HLD (hyperlipidemia)    Chronic, stable. Continue lipitor 20mg  daily.      HTN (hypertension)    Chronic, stable. Continue current regimen.           Follow up plan: Return in about 6 months (around 05/31/2015), or as needed, for medicare wellness visit.

## 2014-11-28 NOTE — Addendum Note (Signed)
Addended by: Ria Bush on: 11/28/2014 01:45 PM   Modules accepted: Orders

## 2014-12-01 ENCOUNTER — Encounter: Payer: Self-pay | Admitting: *Deleted

## 2015-01-23 ENCOUNTER — Telehealth: Payer: Self-pay | Admitting: Family Medicine

## 2015-01-23 ENCOUNTER — Other Ambulatory Visit: Payer: Self-pay | Admitting: *Deleted

## 2015-01-23 MED ORDER — GLUCOSE BLOOD VI STRP: ORAL_STRIP | Status: DC

## 2015-01-23 NOTE — Telephone Encounter (Signed)
Noted - Will await fax.

## 2015-01-23 NOTE — Telephone Encounter (Signed)
Pt called -  Cheryl Bird  Will be sending a fax so that the [facility] can fill her test strips.  Pt req cb [phone]

## 2015-01-29 MED ORDER — GLUCOSE BLOOD VI STRP: ORAL_STRIP | Status: DC

## 2015-01-29 NOTE — Addendum Note (Signed)
Addended by: Helene Shoe on: 01/29/2015 02:52 PM   Modules accepted: Orders

## 2015-01-29 NOTE — Telephone Encounter (Signed)
Cheryl Bird with [employer] left v/m requesting status of request for accu chek test strips on 01/22/15. Refill done.

## 2015-02-04 ENCOUNTER — Other Ambulatory Visit: Payer: Self-pay | Admitting: *Deleted

## 2015-02-04 MED ORDER — GLUCOSE BLOOD VI STRP: 1.0000 | ORAL_STRIP | Status: DC

## 2015-04-26 HISTORY — PX: COLONOSCOPY: SHX174

## 2015-05-24 ENCOUNTER — Other Ambulatory Visit: Payer: Self-pay | Admitting: Family Medicine

## 2015-05-24 DIAGNOSIS — I10 Essential (primary) hypertension: Secondary | ICD-10-CM

## 2015-05-24 DIAGNOSIS — Z1159 Encounter for screening for other viral diseases: Secondary | ICD-10-CM

## 2015-05-24 DIAGNOSIS — E785 Hyperlipidemia, unspecified: Secondary | ICD-10-CM

## 2015-05-24 DIAGNOSIS — E119 Type 2 diabetes mellitus without complications: Secondary | ICD-10-CM

## 2015-05-24 DIAGNOSIS — R7989 Other specified abnormal findings of blood chemistry: Secondary | ICD-10-CM

## 2015-05-26 ENCOUNTER — Other Ambulatory Visit: Payer: Self-pay | Admitting: Family Medicine

## 2015-05-26 ENCOUNTER — Other Ambulatory Visit (INDEPENDENT_AMBULATORY_CARE_PROVIDER_SITE_OTHER): Payer: Commercial Managed Care - HMO

## 2015-05-26 DIAGNOSIS — R7989 Other specified abnormal findings of blood chemistry: Secondary | ICD-10-CM

## 2015-05-26 DIAGNOSIS — Z1159 Encounter for screening for other viral diseases: Secondary | ICD-10-CM

## 2015-05-26 DIAGNOSIS — E785 Hyperlipidemia, unspecified: Secondary | ICD-10-CM | POA: Diagnosis not present

## 2015-05-26 DIAGNOSIS — E119 Type 2 diabetes mellitus without complications: Secondary | ICD-10-CM | POA: Diagnosis not present

## 2015-05-26 DIAGNOSIS — I10 Essential (primary) hypertension: Secondary | ICD-10-CM

## 2015-05-26 LAB — BASIC METABOLIC PANEL
BUN: 20 mg/dL (ref 6–23)
CALCIUM: 9.5 mg/dL (ref 8.4–10.5)
CHLORIDE: 107 meq/L (ref 96–112)
CO2: 28 meq/L (ref 19–32)
CREATININE: 0.91 mg/dL (ref 0.40–1.20)
GFR: 65.01 mL/min (ref >60.00)
GLUCOSE: 124 mg/dL — AB (ref 70–99)
Potassium: 4.8 mEq/L (ref 3.5–5.1)
Sodium: 142 mEq/L (ref 135–145)

## 2015-05-26 LAB — LIPID PANEL
CHOL/HDL RATIO: 2
CHOLESTEROL: 157 mg/dL (ref 0–200)
HDL: 64.9 mg/dL (ref >39.00)
LDL CALC: 76 mg/dL (ref 0–99)
NonHDL: 91.6
TRIGLYCERIDES: 79 mg/dL (ref 0.0–149.0)
VLDL: 15.8 mg/dL (ref 0.0–40.0)

## 2015-05-26 LAB — HEMOGLOBIN A1C: Hgb A1c MFr Bld: 6.9 % — ABNORMAL HIGH (ref 4.6–6.5)

## 2015-05-26 LAB — T4, FREE: Free T4: 0.88 ng/dL (ref 0.60–1.60)

## 2015-05-26 LAB — TSH: TSH: 2.76 u[IU]/mL (ref 0.35–4.50)

## 2015-05-27 DIAGNOSIS — M8580 Other specified disorders of bone density and structure, unspecified site: Secondary | ICD-10-CM

## 2015-05-27 HISTORY — DX: Other specified disorders of bone density and structure, unspecified site: M85.80

## 2015-05-27 LAB — HEPATITIS C ANTIBODY: HCV Ab: NEGATIVE

## 2015-06-02 ENCOUNTER — Encounter: Payer: Self-pay | Admitting: Family Medicine

## 2015-06-02 ENCOUNTER — Ambulatory Visit (INDEPENDENT_AMBULATORY_CARE_PROVIDER_SITE_OTHER): Payer: Commercial Managed Care - HMO | Admitting: Family Medicine

## 2015-06-02 VITALS — BP 124/84 | HR 96 | Temp 97.7°F | Wt 138.0 lb

## 2015-06-02 DIAGNOSIS — E785 Hyperlipidemia, unspecified: Secondary | ICD-10-CM

## 2015-06-02 DIAGNOSIS — E2839 Other primary ovarian failure: Secondary | ICD-10-CM

## 2015-06-02 DIAGNOSIS — Z0000 Encounter for general adult medical examination without abnormal findings: Secondary | ICD-10-CM | POA: Diagnosis not present

## 2015-06-02 DIAGNOSIS — Z23 Encounter for immunization: Secondary | ICD-10-CM | POA: Diagnosis not present

## 2015-06-02 DIAGNOSIS — Z7189 Other specified counseling: Secondary | ICD-10-CM

## 2015-06-02 DIAGNOSIS — F17200 Nicotine dependence, unspecified, uncomplicated: Secondary | ICD-10-CM

## 2015-06-02 DIAGNOSIS — Z1239 Encounter for other screening for malignant neoplasm of breast: Secondary | ICD-10-CM

## 2015-06-02 DIAGNOSIS — E119 Type 2 diabetes mellitus without complications: Secondary | ICD-10-CM

## 2015-06-02 DIAGNOSIS — K2270 Barrett's esophagus without dysplasia: Secondary | ICD-10-CM

## 2015-06-02 DIAGNOSIS — I10 Essential (primary) hypertension: Secondary | ICD-10-CM

## 2015-06-02 DIAGNOSIS — J449 Chronic obstructive pulmonary disease, unspecified: Secondary | ICD-10-CM

## 2015-06-02 NOTE — Addendum Note (Signed)
Addended by: Ria Bush on: 06/02/2015 11:37 AM   Modules accepted: Level of Service, SmartSet

## 2015-06-02 NOTE — Assessment & Plan Note (Signed)
Chronic, stable. Continue lipitor.  

## 2015-06-02 NOTE — Assessment & Plan Note (Signed)
Chronic, stable. Continue lisinorpil.

## 2015-06-02 NOTE — Assessment & Plan Note (Signed)

## 2015-06-02 NOTE — Assessment & Plan Note (Signed)
Continues PPI daily. Worried PPI causing diarrhea - will do trial off med for 1 wk.

## 2015-06-02 NOTE — Assessment & Plan Note (Signed)
Smoking intermittently. No regular albuterol use. Continue to monitor.

## 2015-06-02 NOTE — Assessment & Plan Note (Addendum)
Advanced directives: full code. Would want HCPOA to be daughter. Son in Sports coach is Therapist, sports of finances. Wants trial of life support of deemed reversible. Otherwise doesn't want prolonged life support. Has spoken with daughter/son in law about this. Has advanced directive packet at home.

## 2015-06-02 NOTE — Progress Notes (Signed)
BP 124/84 mmHg  Pulse 96  Temp(Src) 97.7 F (36.5 C) (Oral)  Wt 138 lb (62.596 kg)   CC: medicare wellness visit  Subjective:    Patient ID: Cheryl Bird, female    DOB: [birth year], 70 y.o.   MRN: [id number]  HPI: Cheryl Bird is a 70 y.o. 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 Has seen Dr Pryor Ochoa in the past. Declines re eval Eye exam done 08/2014 Denies falls/depression,anhedonia,sadness.  Preventative: COLONOSCOPY Date: 02/2012 multiple polyps - tubular and sessile adenomas, diverticulosis, decreased rectal tone Gustavo Lah) rec rpt 3 yrs ESOPHAGOGASTRODUODENOSCOPY Date: 05/2013 irregular z line (+barretts), HH, reflux gastroesophagitis, rpt 2 yrs mammo - 02/2014 WNL - advised call to reschedule. Last pap smear - 11/2011 WNL decided to aged out. Agrees to pelvic exam next year.  DEXA -  Flu shot - today Td 11/2011 Pneumovax 8/32013, prevnar 12/2013 Shingles shot - declines Advanced directives: full code. Would want HCPOA to be daughter. Son in Sports coach is Therapist, sports of finances. Wants trial of life support of deemed reversible. Otherwise doesn't want prolonged life support. Has spoken with daughter/son in law about this. Has advanced directive packet at home.  Seat belt use discussed.  Sunscreen use discussed. No changing moles on skin.   Caffeine: 2 cups coffee/day  Occupation: retired  Edu: HS  Activity: walks daily 54min Diet: good water, fruits/vegetables daily  Relevant past medical, surgical, family and social history reviewed and updated as indicated. Interim medical history since our last visit reviewed. Allergies and medications reviewed and updated. Current Outpatient Prescriptions on File Prior to Visit  Medication Sig  . ACCU-CHEK SOFTCLIX LANCETS lancets Use as instructed  . albuterol (PROVENTIL HFA;VENTOLIN HFA) 108 (90 BASE) MCG/ACT  inhaler Inhale 2 puffs into the lungs every 6 (six) hours as needed for wheezing.  Marland Kitchen atorvastatin (LIPITOR) 20 MG tablet TAKE 1 TABLET (20 MG TOTAL) BY MOUTH NIGHTLY  . glucose blood (ACCU-CHEK SMARTVIEW) test strip 1 each by Other route as directed. Use as instructed to check blood sugar once daily and as needed. Dx: E11.9 **NANO SMARTVIEW**  . lisinopril (PRINIVIL,ZESTRIL) 20 MG tablet TAKE 1 TABLET (20 MG TOTAL) BY MOUTH DAILY.  . metFORMIN (GLUCOPHAGE) 1000 MG tablet TAKE 1 TABLET BY MOUTH TWICE A DAY WITH A MEAL  . omeprazole (PRILOSEC) 20 MG capsule Take 20 mg by mouth daily.  . sitaGLIPtin (JANUVIA) 50 MG tablet Take 1 tablet (50 mg total) by mouth daily.   No current facility-administered medications on file prior to visit.    Review of Systems  Constitutional: Negative for fever, chills, activity change, appetite change, fatigue and unexpected weight change.  HENT: Negative for hearing loss.   Eyes: Negative for visual disturbance.  Respiratory: Negative for cough, chest tightness, shortness of breath and wheezing.   Cardiovascular: Negative for chest pain, palpitations and leg swelling.  Gastrointestinal: Positive for diarrhea. Negative for nausea, vomiting, abdominal pain, constipation, blood in stool and abdominal distention.  Genitourinary: Negative for hematuria and difficulty urinating.  Musculoskeletal: Negative for myalgias, arthralgias and neck pain.  Skin: Negative for rash.  Neurological: Negative for dizziness, seizures, syncope and headaches.  Hematological: Negative for adenopathy. Does not bruise/bleed easily.  Psychiatric/Behavioral: Negative for dysphoric mood. The patient is not nervous/anxious.    Per HPI unless specifically indicated in ROS section     Objective:  BP 124/84 mmHg  Pulse 96  Temp(Src) 97.7 F (36.5 C) (Oral)  Wt 138 lb (62.596 kg)  Wt Readings from Last 3 Encounters:  06/02/15 138 lb (62.596 kg)  11/28/14 128 lb 8 oz (58.287 kg)    05/28/14 126 lb (57.153 kg)   Body mass index is 22.96 kg/(m^2).  Physical Exam  Constitutional: She is oriented to person, place, and time. She appears well-developed and well-nourished. No distress.  HENT:  Head: Normocephalic and atraumatic.  Right Ear: Hearing, tympanic membrane, external ear and ear canal normal.  Left Ear: Hearing, tympanic membrane, external ear and ear canal normal.  Nose: Nose normal.  Mouth/Throat: Uvula is midline, oropharynx is clear and moist and mucous membranes are normal. No oropharyngeal exudate, posterior oropharyngeal edema or posterior oropharyngeal erythema.  Eyes: Conjunctivae and EOM are normal. Pupils are equal, round, and reactive to light. No scleral icterus.  Neck: Normal range of motion. Neck supple. Carotid bruit is not present. No thyromegaly present.  Cardiovascular: Normal rate, regular rhythm, normal heart sounds and intact distal pulses.   No murmur heard. Pulses:      Radial pulses are 2+ on the right side, and 2+ on the left side.  Pulmonary/Chest: Effort normal and breath sounds normal. No respiratory distress. She has no wheezes. She has no rales.  Declines breast exam  Abdominal: Soft. Bowel sounds are normal. She exhibits no distension and no mass. There is no tenderness. There is no rebound and no guarding.  Genitourinary:  Declines GYN exam this year  Musculoskeletal: Normal range of motion. She exhibits no edema.  Lymphadenopathy:    She has no cervical adenopathy.  Neurological: She is alert and oriented to person, place, and time.  CN grossly intact, station and gait intact Recall 2/3, 3/3 with cue Calculation [id number]  Skin: Skin is warm and dry. No rash noted.  Psychiatric: She has a normal mood and affect. Her behavior is normal. Judgment and thought content normal.  Nursing note and vitals reviewed.  Results for orders placed or performed in visit on 05/26/15  Lipid panel  Result Value Ref Range   Cholesterol 157  0 - 200 mg/dL   Triglycerides 79.0 0.0 - 149.0 mg/dL   HDL 64.90 >39.00 mg/dL   VLDL 15.8 0.0 - 40.0 mg/dL   LDL Cholesterol 76 0 - 99 mg/dL   Total CHOL/HDL Ratio 2    NonHDL 91.60   Hemoglobin A1c  Result Value Ref Range   Hgb A1c MFr Bld 6.9 (H) 4.6 - 6.5 %  TSH  Result Value Ref Range   TSH 2.76 0.35 - 4.50 uIU/mL  Basic metabolic panel  Result Value Ref Range   Sodium 142 135 - 145 mEq/L   Potassium 4.8 3.5 - 5.1 mEq/L   Chloride 107 96 - 112 mEq/L   CO2 28 19 - 32 mEq/L   Glucose, Bld 124 (H) 70 - 99 mg/dL   BUN 20 6 - 23 mg/dL   Creatinine, Ser 0.91 0.40 - 1.20 mg/dL   Calcium 9.5 8.4 - 10.5 mg/dL   GFR 65.01 >60.00 mL/min  T4, free  Result Value Ref Range   Free T4 0.88 0.60 - 1.60 ng/dL      Assessment & Plan:   Problem List Items Addressed This Visit    Smoker    Encouraged cessation.      Medicare annual wellness visit, subsequent - Primary    I have personally reviewed the Medicare Annual Wellness questionnaire and  have noted 1. The patient's medical and social history 2. Their use of alcohol, tobacco or illicit drugs 3. Their current medications and supplements 4. The patient's functional ability including ADL's, fall risks, home safety risks and hearing or visual impairment. Cognitive function has been assessed and addressed as indicated.  5. Diet and physical activity 6. Evidence for depression or mood disorders The patients weight, height, BMI have been recorded in the chart. I have made referrals, counseling and provided education to the patient based on review of the above and I have provided the pt with a written personalized care plan for preventive services. Provider list updated.. See scanned questionairre as needed for further documentation. Reviewed preventative protocols and updated unless pt declined.       HTN (hypertension)    Chronic, stable. Continue lisinorpil.       HLD (hyperlipidemia)    Chronic, stable. Continue lipitor.       Health maintenance examination    Preventative protocols reviewed and updated unless pt declined. Discussed healthy diet and lifestyle.       Diabetes type 2, controlled ([place])    Reviewed labs.      COPD, moderate ([place])    Smoking intermittently. No regular albuterol use. Continue to monitor.      Barrett esophagus    Continues PPI daily. Worried PPI causing diarrhea - will do trial off med for 1 wk.       Advanced care planning/counseling discussion    Advanced directives: full code. Would want HCPOA to be daughter. Son in Sports coach is Therapist, sports of finances. Wants trial of life support of deemed reversible. Otherwise doesn't want prolonged life support. Has spoken with daughter/son in law about this. Has advanced directive packet at home.        Other Visit Diagnoses    Need for influenza vaccination        Relevant Orders    Flu Vaccine QUAD 36+ mos PF IM (Fluarix & Fluzone Quad PF) (Completed)    Estrogen deficiency        Relevant Orders    DG Bone Density    Breast cancer screening        Relevant Orders    MM Digital Screening        Follow up plan: Return in about 6 months (around 11/30/2015), or as needed, for follow up visit.

## 2015-06-02 NOTE — Assessment & Plan Note (Signed)
Encouraged cessation.

## 2015-06-02 NOTE — Progress Notes (Signed)
Pre visit review using our clinic review tool, if applicable. No additional management support is needed unless otherwise documented below in the visit note. 

## 2015-06-02 NOTE — Assessment & Plan Note (Signed)
Preventative protocols reviewed and updated unless pt declined. Discussed healthy diet and lifestyle.  

## 2015-06-02 NOTE — Patient Instructions (Addendum)
Flu shot today Call Dr Marton Redwood office to schedule repeat colonoscopy as you're overdue.  We will schedule mammogram and bone density scan over next few weeks. Review advanced directive packet with son when you have the chance. Bring me a copy to update your chart.  Return as needed or in 6 months for diabetes follow up.  Health Maintenance, Female Adopting a healthy lifestyle and getting preventive care can go a long way to promote health and wellness. Talk with your health care provider about what schedule of regular examinations is right for you. This is a good chance for you to check in with your provider about disease prevention and staying healthy. In between checkups, there are plenty of things you can do on your own. Experts have done a lot of research about which lifestyle changes and preventive measures are most likely to keep you healthy. Ask your health care provider for more information. WEIGHT AND DIET  Eat a healthy diet  Be sure to include plenty of vegetables, fruits, low-fat dairy products, and lean protein.  Do not eat a lot of foods high in solid fats, added sugars, or salt.  Get regular exercise. This is one of the most important things you can do for your health.  Most adults should exercise for at least 150 minutes each week. The exercise should increase your heart rate and make you sweat (moderate-intensity exercise).  Most adults should also do strengthening exercises at least twice a week. This is in addition to the moderate-intensity exercise.  Maintain a healthy weight  Body mass index (BMI) is a measurement that can be used to identify possible weight problems. It estimates body fat based on height and weight. Your health care provider can help determine your BMI and help you achieve or maintain a healthy weight.  For females 54 years of age and older:   A BMI below 18.5 is considered underweight.  A BMI of 18.5 to 24.9 is normal.  A BMI of 25 to 29.9  is considered overweight.  A BMI of 30 and above is considered obese.  Watch levels of cholesterol and blood lipids  You should start having your blood tested for lipids and cholesterol at 70 years of age, then have this test every 5 years.  You may need to have your cholesterol levels checked more often if:  Your lipid or cholesterol levels are high.  You are older than 70 years of age.  You are at high risk for heart disease.  CANCER SCREENING   Lung Cancer  Lung cancer screening is recommended for adults 30-37 years old who are at high risk for lung cancer because of a history of smoking.  A yearly low-dose CT scan of the lungs is recommended for people who:  Currently smoke.  Have quit within the past 15 years.  Have at least a 30-pack-year history of smoking. A pack year is smoking an average of one pack of cigarettes a day for 1 year.  Yearly screening should continue until it has been 15 years since you quit.  Yearly screening should stop if you develop a health problem that would prevent you from having lung cancer treatment.  Breast Cancer  Practice breast self-awareness. This means understanding how your breasts normally appear and feel.  It also means doing regular breast self-exams. Let your health care provider know about any changes, no matter how small.  If you are in your 20s or 30s, you should have a clinical breast exam (  CBE) by a health care provider every 1-3 years as part of a regular health exam.  If you are 48 or older, have a CBE every year. Also consider having a breast X-ray (mammogram) every year.  If you have a family history of breast cancer, talk to your health care provider about genetic screening.  If you are at high risk for breast cancer, talk to your health care provider about having an MRI and a mammogram every year.  Breast cancer gene (BRCA) assessment is recommended for women who have family members with BRCA-related cancers.  BRCA-related cancers include:  Breast.  Ovarian.  Tubal.  Peritoneal cancers.  Results of the assessment will determine the need for genetic counseling and BRCA1 and BRCA2 testing. Cervical Cancer Your health care provider may recommend that you be screened regularly for cancer of the pelvic organs (ovaries, uterus, and vagina). This screening involves a pelvic examination, including checking for microscopic changes to the surface of your cervix (Pap test). You may be encouraged to have this screening done every 3 years, beginning at age 31.  For women ages 63-65, health care providers may recommend pelvic exams and Pap testing every 3 years, or they may recommend the Pap and pelvic exam, combined with testing for human papilloma virus (HPV), every 5 years. Some types of HPV increase your risk of cervical cancer. Testing for HPV may also be done on women of any age with unclear Pap test results.  Other health care providers may not recommend any screening for nonpregnant women who are considered low risk for pelvic cancer and who do not have symptoms. Ask your health care provider if a screening pelvic exam is right for you.  If you have had past treatment for cervical cancer or a condition that could lead to cancer, you need Pap tests and screening for cancer for at least 20 years after your treatment. If Pap tests have been discontinued, your risk factors (such as having a new sexual partner) need to be reassessed to determine if screening should resume. Some women have medical problems that increase the chance of getting cervical cancer. In these cases, your health care provider may recommend more frequent screening and Pap tests. Colorectal Cancer  This type of cancer can be detected and often prevented.  Routine colorectal cancer screening usually begins at 70 years of age and continues through 70 years of age.  Your health care provider may recommend screening at an earlier age if you  have risk factors for colon cancer.  Your health care provider may also recommend using home test kits to check for hidden blood in the stool.  A small camera at the end of a tube can be used to examine your colon directly (sigmoidoscopy or colonoscopy). This is done to check for the earliest forms of colorectal cancer.  Routine screening usually begins at age 88.  Direct examination of the colon should be repeated every 5-10 years through 70 years of age. However, you may need to be screened more often if early forms of precancerous polyps or small growths are found. Skin Cancer  Check your skin from head to toe regularly.  Tell your health care provider about any new moles or changes in moles, especially if there is a change in a mole's shape or color.  Also tell your health care provider if you have a mole that is larger than the size of a pencil eraser.  Always use sunscreen. Apply sunscreen liberally and repeatedly throughout  the day.  Protect yourself by wearing long sleeves, pants, a wide-brimmed hat, and sunglasses whenever you are outside. HEART DISEASE, DIABETES, AND HIGH BLOOD PRESSURE   High blood pressure causes heart disease and increases the risk of stroke. High blood pressure is more likely to develop in:  People who have blood pressure in the high end of the normal range (130-139/85-89 mm Hg).  People who are overweight or obese.  People who are African American.  If you are 104-76 years of age, have your blood pressure checked every 3-5 years. If you are 61 years of age or older, have your blood pressure checked every year. You should have your blood pressure measured twice--once when you are at a hospital or clinic, and once when you are not at a hospital or clinic. Record the average of the two measurements. To check your blood pressure when you are not at a hospital or clinic, you can use:  An automated blood pressure machine at a pharmacy.  A home blood pressure  monitor.  If you are between 39 years and 63 years old, ask your health care provider if you should take aspirin to prevent strokes.  Have regular diabetes screenings. This involves taking a blood sample to check your fasting blood sugar level.  If you are at a normal weight and have a low risk for diabetes, have this test once every three years after 70 years of age.  If you are overweight and have a high risk for diabetes, consider being tested at a younger age or more often. PREVENTING INFECTION  Hepatitis B  If you have a higher risk for hepatitis B, you should be screened for this virus. You are considered at high risk for hepatitis B if:  You were born in a country where hepatitis B is common. Ask your health care provider which countries are considered high risk.  Your parents were born in a high-risk country, and you have not been immunized against hepatitis B (hepatitis B vaccine).  You have HIV or AIDS.  You use needles to inject street drugs.  You live with someone who has hepatitis B.  You have had sex with someone who has hepatitis B.  You get hemodialysis treatment.  You take certain medicines for conditions, including cancer, organ transplantation, and autoimmune conditions. Hepatitis C  Blood testing is recommended for:  Everyone born from [birth year] through 1965.  Anyone with known risk factors for hepatitis C. Sexually transmitted infections (STIs)  You should be screened for sexually transmitted infections (STIs) including gonorrhea and chlamydia if:  You are sexually active and are younger than 70 years of age.  You are older than 70 years of age and your health care provider tells you that you are at risk for this type of infection.  Your sexual activity has changed since you were last screened and you are at an increased risk for chlamydia or gonorrhea. Ask your health care provider if you are at risk.  If you do not have HIV, but are at risk, it may be  recommended that you take a prescription medicine daily to prevent HIV infection. This is called pre-exposure prophylaxis (PrEP). You are considered at risk if:  You are sexually active and do not regularly use condoms or know the HIV status of your partner(s).  You take drugs by injection.  You are sexually active with a partner who has HIV. Talk with your health care provider about whether you are at high risk  of being infected with HIV. If you choose to begin PrEP, you should first be tested for HIV. You should then be tested every 3 months for as long as you are taking PrEP.  PREGNANCY   If you are premenopausal and you may become pregnant, ask your health care provider about preconception counseling.  If you may become pregnant, take 400 to 800 micrograms (mcg) of folic acid every day.  If you want to prevent pregnancy, talk to your health care provider about birth control (contraception). OSTEOPOROSIS AND MENOPAUSE   Osteoporosis is a disease in which the bones lose minerals and strength with aging. This can result in serious bone fractures. Your risk for osteoporosis can be identified using a bone density scan.  If you are 51 years of age or older, or if you are at risk for osteoporosis and fractures, ask your health care provider if you should be screened.  Ask your health care provider whether you should take a calcium or vitamin D supplement to lower your risk for osteoporosis.  Menopause may have certain physical symptoms and risks.  Hormone replacement therapy may reduce some of these symptoms and risks. Talk to your health care provider about whether hormone replacement therapy is right for you.  HOME CARE INSTRUCTIONS   Schedule regular health, dental, and eye exams.  Stay current with your immunizations.   Do not use any tobacco products including cigarettes, chewing tobacco, or electronic cigarettes.  If you are pregnant, do not drink alcohol.  If you are  breastfeeding, limit how much and how often you drink alcohol.  Limit alcohol intake to no more than 1 drink per day for nonpregnant women. One drink equals 12 ounces of beer, 5 ounces of wine, or 1 ounces of hard liquor.  Do not use street drugs.  Do not share needles.  Ask your health care provider for help if you need support or information about quitting drugs.  Tell your health care provider if you often feel depressed.  Tell your health care provider if you have ever been abused or do not feel safe at home.   This information is not intended to replace advice given to you by your health care provider. Make sure you discuss any questions you have with your health care provider.   Document Released: 10/25/2010 Document Revised: 05/02/2014 Document Reviewed: 03/13/2013 Elsevier Interactive Patient Education [employer].

## 2015-06-02 NOTE — Assessment & Plan Note (Signed)
Reviewed labs

## 2015-06-03 ENCOUNTER — Telehealth: Payer: Self-pay | Admitting: Family Medicine

## 2015-06-03 NOTE — Telephone Encounter (Signed)
Noted thanks °

## 2015-06-03 NOTE — Telephone Encounter (Signed)
Pt called and made appointment with christy london @ [facility] about colonoscopy 06/25/15  She will find out then when colonoscopy appointment

## 2015-06-15 ENCOUNTER — Other Ambulatory Visit: Payer: Self-pay | Admitting: Family Medicine

## 2015-06-15 ENCOUNTER — Ambulatory Visit
Admission: RE | Admit: 2015-06-15 | Discharge: 2015-06-15 | Disposition: A | Discharge status: Home / Self Care | Payer: Commercial Managed Care - HMO | Source: Ambulatory Visit | Attending: Family Medicine | Admitting: Family Medicine

## 2015-06-15 DIAGNOSIS — Z780 Asymptomatic menopausal state: Secondary | ICD-10-CM | POA: Insufficient documentation

## 2015-06-15 DIAGNOSIS — M8580 Other specified disorders of bone density and structure, unspecified site: Secondary | ICD-10-CM | POA: Diagnosis not present

## 2015-06-15 DIAGNOSIS — Z1231 Encounter for screening mammogram for malignant neoplasm of breast: Secondary | ICD-10-CM | POA: Diagnosis present

## 2015-06-15 DIAGNOSIS — Z13820 Encounter for screening for osteoporosis: Secondary | ICD-10-CM | POA: Insufficient documentation

## 2015-06-15 DIAGNOSIS — Z1239 Encounter for other screening for malignant neoplasm of breast: Secondary | ICD-10-CM

## 2015-06-15 DIAGNOSIS — E2839 Other primary ovarian failure: Secondary | ICD-10-CM

## 2015-06-15 LAB — HM MAMMOGRAPHY: HM Mammogram: NORMAL

## 2015-06-22 ENCOUNTER — Encounter: Payer: Self-pay | Admitting: *Deleted

## 2015-06-24 ENCOUNTER — Other Ambulatory Visit: Payer: Self-pay | Admitting: Family Medicine

## 2015-06-26 ENCOUNTER — Encounter: Payer: Self-pay | Admitting: *Deleted

## 2015-06-26 ENCOUNTER — Other Ambulatory Visit: Payer: Self-pay | Admitting: Family Medicine

## 2015-06-26 ENCOUNTER — Encounter: Payer: Self-pay | Admitting: Family Medicine

## 2015-06-26 MED ORDER — CALCIUM CARBONATE-VITAMIN D 600-400 MG-UNIT PO CHEW: 1.0000 | CHEWABLE_TABLET | Freq: Every day | ORAL | Status: DC

## 2015-07-14 ENCOUNTER — Telehealth: Payer: Self-pay | Admitting: Family Medicine

## 2015-07-14 NOTE — Telephone Encounter (Signed)
error 

## 2015-07-17 ENCOUNTER — Other Ambulatory Visit: Payer: Self-pay | Admitting: *Deleted

## 2015-07-17 MED ORDER — ALCOHOL SWABS PADS: MEDICATED_PAD | Status: DC

## 2015-07-17 MED ORDER — ACCU-CHEK SOFTCLIX LANCETS MISC: Status: DC

## 2015-07-20 ENCOUNTER — Other Ambulatory Visit: Payer: Self-pay | Admitting: Family Medicine

## 2015-07-23 ENCOUNTER — Other Ambulatory Visit: Payer: Self-pay | Admitting: Family Medicine

## 2015-07-27 ENCOUNTER — Other Ambulatory Visit: Payer: Self-pay | Admitting: Family Medicine

## 2015-08-04 ENCOUNTER — Other Ambulatory Visit: Payer: Self-pay | Admitting: *Deleted

## 2015-08-04 MED ORDER — ALCOHOL SWABS PADS: MEDICATED_PAD | Status: DC

## 2015-08-04 MED ORDER — ACCU-CHEK SOFTCLIX LANCETS MISC: Status: DC

## 2015-11-24 HISTORY — PX: ESOPHAGOGASTRODUODENOSCOPY: SHX1529

## 2015-11-30 ENCOUNTER — Ambulatory Visit (INDEPENDENT_AMBULATORY_CARE_PROVIDER_SITE_OTHER): Payer: Medicare Other | Admitting: Family Medicine

## 2015-11-30 ENCOUNTER — Encounter: Payer: Self-pay | Admitting: Family Medicine

## 2015-11-30 VITALS — BP 128/74 | HR 88 | Temp 97.9°F | Wt 139.8 lb

## 2015-11-30 DIAGNOSIS — E119 Type 2 diabetes mellitus without complications: Secondary | ICD-10-CM

## 2015-11-30 DIAGNOSIS — N3946 Mixed incontinence: Secondary | ICD-10-CM | POA: Insufficient documentation

## 2015-11-30 DIAGNOSIS — E785 Hyperlipidemia, unspecified: Secondary | ICD-10-CM | POA: Diagnosis not present

## 2015-11-30 DIAGNOSIS — I10 Essential (primary) hypertension: Secondary | ICD-10-CM

## 2015-11-30 DIAGNOSIS — K2100 Gastro-esophageal reflux disease with esophagitis, without bleeding: Secondary | ICD-10-CM

## 2015-11-30 LAB — BASIC METABOLIC PANEL
BUN: 16 mg/dL (ref 6–23)
CALCIUM: 10.2 mg/dL (ref 8.4–10.5)
CHLORIDE: 105 meq/L (ref 96–112)
CO2: 28 meq/L (ref 19–32)
CREATININE: 0.89 mg/dL (ref 0.40–1.20)
GFR: 66.6 mL/min (ref >60.00)
GLUCOSE: 115 mg/dL — AB (ref 70–99)
Potassium: 5 mEq/L (ref 3.5–5.1)
Sodium: 139 mEq/L (ref 135–145)

## 2015-11-30 LAB — HEMOGLOBIN A1C: HEMOGLOBIN A1C: 6.9 % — AB (ref 4.6–6.5)

## 2015-11-30 LAB — POC URINALSYSI DIPSTICK (AUTOMATED)
BILIRUBIN UA: NEGATIVE
Blood, UA: NEGATIVE
GLUCOSE UA: NEGATIVE
Ketones, UA: NEGATIVE
Nitrite, UA: NEGATIVE
Urobilinogen, UA: 0.2
pH, UA: 6

## 2015-11-30 NOTE — Assessment & Plan Note (Addendum)
Check urinalysis today - discussed kegel exercises. Consider antimuscarinic.

## 2015-11-30 NOTE — Progress Notes (Signed)
Pre visit review using our clinic review tool, if applicable. No additional management support is needed unless otherwise documented below in the visit note. 

## 2015-11-30 NOTE — Assessment & Plan Note (Addendum)
Chronic, stable. Check labs today. Continue metformin and januvia. She does check labs once daily.

## 2015-11-30 NOTE — Assessment & Plan Note (Signed)
Chronic, stable. Continue lisinopril '20mg'$  daily.

## 2015-11-30 NOTE — Assessment & Plan Note (Signed)
Chronic, stable. Continue lipitor.  

## 2015-11-30 NOTE — Progress Notes (Signed)
BP 128/74   Pulse 88   Temp 97.9 F (36.6 C) (Oral)   Wt 139 lb 12 oz (63.4 kg)   BMI 23.26 kg/m    CC: 32mo f/u visit Subjective:    Patient ID: Cheryl Bird, female    DOB: Jul 14, 1945, 70 y.o.   MRN: [id number]  HPI: Cheryl Bird is a 70 y.o. female presenting on 11/30/2015 for Follow-up   HTN - compliant with lisinopril daily. HLD - compliant with lipitor daily. DM - compliant with metformin 1000mg  BID and januvia 50mg  daily.  Pending colonoscopy 12/21/2015.   GERD - prior on omeprazole 20mg  daily, may have caused diarrhea so we stopped this. [facility] prescribed a different heartburn medication. Pt unsure which this is.  Noticing increased trouble with urinary incontinence without dysuria, hematuria. Noticing decreased strength of stream. Some leaking with cough/sneezing. Remote h/o bladder sling.   Relevant past medical, surgical, family and social history reviewed and updated as indicated. Interim medical history since our last visit reviewed. Allergies and medications reviewed and updated. Current Outpatient Prescriptions on File Prior to Visit  Medication Sig  . ACCU-CHEK SOFTCLIX LANCETS lancets Use to check sugar once daily and as needed Dx: E11.9  . albuterol (PROVENTIL HFA;VENTOLIN HFA) 108 (90 BASE) MCG/ACT inhaler Inhale 2 puffs into the lungs every 6 (six) hours as needed for wheezing.  . Alcohol Swabs PADS Use to clean finger prior to testing blood sugar. Dx: E11.9  . atorvastatin (LIPITOR) 20 MG tablet TAKE 1 TABLET (20 MG TOTAL) BY MOUTH NIGHTLY  . Calcium Carbonate-Vitamin D (CALCIUM 600/VITAMIN D) 600-400 MG-UNIT chew tablet Chew 1 tablet by mouth daily.  Marland Kitchen glucose blood (ACCU-CHEK SMARTVIEW) test strip 1 each by Other route as directed. Use as instructed to check blood sugar once daily and as needed. Dx: E11.9 **NANO SMARTVIEW**  . JANUVIA 50 MG tablet TAKE 1 TABLET (50 MG TOTAL) BY MOUTH DAILY.  Marland Kitchen lisinopril (PRINIVIL,ZESTRIL) 20 MG tablet TAKE 1  TABLET (20 MG TOTAL) BY MOUTH DAILY.  . metFORMIN (GLUCOPHAGE) 1000 MG tablet TAKE 1 TABLET BY MOUTH TWICE A DAY WITH A MEAL   No current facility-administered medications on file prior to visit.     Review of Systems Per HPI unless specifically indicated in ROS section     Objective:    BP 128/74   Pulse 88   Temp 97.9 F (36.6 C) (Oral)   Wt 139 lb 12 oz (63.4 kg)   BMI 23.26 kg/m   Wt Readings from Last 3 Encounters:  11/30/15 139 lb 12 oz (63.4 kg)  06/02/15 138 lb (62.6 kg)  11/28/14 128 lb 8 oz (58.3 kg)    Physical Exam  Constitutional: She appears well-developed and well-nourished. No distress.  HENT:  Mouth/Throat: Oropharynx is clear and moist. No oropharyngeal exudate.  Cardiovascular: Normal rate, regular rhythm, normal heart sounds and intact distal pulses.   No murmur heard. Pulmonary/Chest: Effort normal and breath sounds normal. No respiratory distress. She has no wheezes. She has no rales.  Musculoskeletal: She exhibits no edema.  Nursing note and vitals reviewed.  Results for orders placed or performed in visit on 06/22/15  [facility] MAMMOGRAPHY  Result Value Ref Range   HM Mammogram Normal Birads 1-repeat 1 year       Assessment & Plan:   Problem List Items Addressed This Visit    Diabetes type 2, controlled ([place]) - Primary    Chronic, stable. Check labs today. Continue metformin and januvia.  She does check labs once daily.       Relevant Orders   Hemoglobin [id number]   Basic metabolic panel   GERD (gastroesophageal reflux disease)    Pt states she was started on different GERD medication by GI - looks like protonix 40mg  daily was started. Continue current dose, pt states working better without associated diarrhea.       Relevant Medications   pantoprazole (PROTONIX) 40 MG tablet   HLD (hyperlipidemia)    Chronic, stable. Continue lipitor.       HTN (hypertension)    Chronic, stable. Continue lisinopril 20mg  daily.       Urinary incontinence,  mixed    Check urinalysis today - discussed kegel exercises. Consider antimuscarinic.        Other Visit Diagnoses   None.      Follow up plan: Return in about 6 months (around 06/01/2016), or as needed, for medicare wellness visit.  Ria Bush, MD

## 2015-11-30 NOTE — Patient Instructions (Addendum)
Check urinalysis today.  Lab work today.  Check to see what heartburn medication you are taking and let us know.  Try kegel exercises for urinary trouble.   Kegel Exercises The goal of Kegel exercises is to isolate and exercise your pelvic floor muscles. These muscles act as a hammock that supports the rectum, vagina, small intestine, and uterus. As the muscles weaken, the hammock sags and these organs are displaced from their normal positions. Kegel exercises can strengthen your pelvic floor muscles and help you to improve bladder and bowel control, improve sexual response, and help reduce many problems and some discomfort during pregnancy. Kegel exercises can be done anywhere and at any time. HOW TO PERFORM KEGEL EXERCISES 1. Locate your pelvic floor muscles. To do this, squeeze (contract) the muscles that you use when you try to stop the flow of urine. You will feel a tightness in the vaginal area (women) and a tight lift in the rectal area (men and women). 2. When you begin, contract your pelvic muscles tight for 2-5 seconds, then relax them for 2-5 seconds. This is one set. Do 4-5 sets with a short pause in between. 3. Contract your pelvic muscles for 8-10 seconds, then relax them for 8-10 seconds. Do 4-5 sets. If you cannot contract your pelvic muscles for 8-10 seconds, try 5-7 seconds and work your way up to 8-10 seconds. Your goal is 4-5 sets of 10 contractions each day. Keep your stomach, buttocks, and legs relaxed during the exercises. Perform sets of both short and long contractions. Vary your positions. Perform these contractions 3-4 times per day. Perform sets while you are:   Lying in bed in the morning.  Standing at lunch.  Sitting in the late afternoon.  Lying in bed at night. You should do 40-50 contractions per day. Do not perform more Kegel exercises per day than recommended. Overexercising can cause muscle fatigue. Continue these exercises for for at least 15-20 weeks or as  directed by your caregiver.   This information is not intended to replace advice given to you by your health care provider. Make sure you discuss any questions you have with your health care provider.   Document Released: 03/28/2012 Document Revised: 05/02/2014 Document Reviewed: 03/28/2012 Elsevier Interactive Patient Education [employer].

## 2015-11-30 NOTE — Addendum Note (Signed)
Addended by: Royann Shivers A on: 11/30/2015 02:54 PM   Modules accepted: Orders

## 2015-11-30 NOTE — Assessment & Plan Note (Signed)
Pt states she was started on different GERD medication by GI - looks like protonix 40mg  daily was started. Continue current dose, pt states working better without associated diarrhea.

## 2015-12-05 ENCOUNTER — Other Ambulatory Visit: Payer: Self-pay | Admitting: Family Medicine

## 2015-12-05 MED ORDER — OXYBUTYNIN CHLORIDE 5 MG PO TABS: 5.0000 mg | ORAL_TABLET | Freq: Two times a day (BID) | ORAL | 1 refills | Status: DC | PRN

## 2015-12-18 ENCOUNTER — Encounter: Payer: Self-pay | Admitting: *Deleted

## 2015-12-21 ENCOUNTER — Encounter
Admission: RE | Disposition: A | Discharge status: Home / Self Care | Payer: Self-pay | Source: Ambulatory Visit | Attending: Gastroenterology

## 2015-12-21 ENCOUNTER — Encounter: Payer: Self-pay | Admitting: *Deleted

## 2015-12-21 ENCOUNTER — Ambulatory Visit
Admission: RE | Admit: 2015-12-21 | Discharge: 2015-12-21 | Disposition: A | Discharge status: Home / Self Care | Payer: Medicare Other | Source: Ambulatory Visit | Attending: Gastroenterology | Admitting: Gastroenterology

## 2015-12-21 ENCOUNTER — Ambulatory Visit: Payer: Medicare Other | Admitting: Anesthesiology

## 2015-12-21 DIAGNOSIS — E785 Hyperlipidemia, unspecified: Secondary | ICD-10-CM | POA: Insufficient documentation

## 2015-12-21 DIAGNOSIS — Z87891 Personal history of nicotine dependence: Secondary | ICD-10-CM | POA: Diagnosis not present

## 2015-12-21 DIAGNOSIS — K3189 Other diseases of stomach and duodenum: Secondary | ICD-10-CM | POA: Diagnosis not present

## 2015-12-21 DIAGNOSIS — Z886 Allergy status to analgesic agent status: Secondary | ICD-10-CM | POA: Insufficient documentation

## 2015-12-21 DIAGNOSIS — B9681 Helicobacter pylori [H. pylori] as the cause of diseases classified elsewhere: Secondary | ICD-10-CM | POA: Diagnosis not present

## 2015-12-21 DIAGNOSIS — I10 Essential (primary) hypertension: Secondary | ICD-10-CM | POA: Insufficient documentation

## 2015-12-21 DIAGNOSIS — Z7984 Long term (current) use of oral hypoglycemic drugs: Secondary | ICD-10-CM | POA: Diagnosis not present

## 2015-12-21 DIAGNOSIS — K529 Noninfective gastroenteritis and colitis, unspecified: Secondary | ICD-10-CM | POA: Diagnosis not present

## 2015-12-21 DIAGNOSIS — H905 Unspecified sensorineural hearing loss: Secondary | ICD-10-CM | POA: Diagnosis not present

## 2015-12-21 DIAGNOSIS — Z79899 Other long term (current) drug therapy: Secondary | ICD-10-CM | POA: Insufficient documentation

## 2015-12-21 DIAGNOSIS — E119 Type 2 diabetes mellitus without complications: Secondary | ICD-10-CM | POA: Insufficient documentation

## 2015-12-21 DIAGNOSIS — M8580 Other specified disorders of bone density and structure, unspecified site: Secondary | ICD-10-CM | POA: Insufficient documentation

## 2015-12-21 DIAGNOSIS — K2970 Gastritis, unspecified, without bleeding: Secondary | ICD-10-CM | POA: Diagnosis not present

## 2015-12-21 DIAGNOSIS — J449 Chronic obstructive pulmonary disease, unspecified: Secondary | ICD-10-CM | POA: Insufficient documentation

## 2015-12-21 DIAGNOSIS — K2960 Other gastritis without bleeding: Secondary | ICD-10-CM | POA: Diagnosis not present

## 2015-12-21 DIAGNOSIS — K2950 Unspecified chronic gastritis without bleeding: Secondary | ICD-10-CM | POA: Insufficient documentation

## 2015-12-21 DIAGNOSIS — K2270 Barrett's esophagus without dysplasia: Secondary | ICD-10-CM | POA: Insufficient documentation

## 2015-12-21 DIAGNOSIS — K210 Gastro-esophageal reflux disease with esophagitis: Secondary | ICD-10-CM | POA: Diagnosis not present

## 2015-12-21 DIAGNOSIS — R197 Diarrhea, unspecified: Secondary | ICD-10-CM | POA: Diagnosis not present

## 2015-12-21 DIAGNOSIS — Z86010 Personal history of colonic polyps: Secondary | ICD-10-CM | POA: Diagnosis not present

## 2015-12-21 HISTORY — PX: COLONOSCOPY WITH PROPOFOL: SHX5780

## 2015-12-21 HISTORY — PX: ESOPHAGOGASTRODUODENOSCOPY (EGD) WITH PROPOFOL: SHX5813

## 2015-12-21 LAB — GLUCOSE, CAPILLARY: Glucose-Capillary: 155 mg/dL — ABNORMAL HIGH (ref 65–99)

## 2015-12-21 SURGERY — ESOPHAGOGASTRODUODENOSCOPY (EGD) WITH PROPOFOL
Anesthesia: General

## 2015-12-21 MED ORDER — EPHEDRINE SULFATE 50 MG/ML IJ SOLN
INTRAMUSCULAR | Status: DC | PRN
  Administered 2015-12-21: 10 mg via INTRAVENOUS

## 2015-12-21 MED ORDER — PHENYLEPHRINE HCL 10 MG/ML IJ SOLN
INTRAMUSCULAR | Status: DC | PRN
  Administered 2015-12-21 (×3): 100 ug via INTRAVENOUS

## 2015-12-21 MED ORDER — MIDAZOLAM HCL 5 MG/5ML IJ SOLN
INTRAMUSCULAR | Status: DC | PRN
  Administered 2015-12-21: 1 mg via INTRAVENOUS

## 2015-12-21 MED ORDER — SODIUM CHLORIDE 0.9 % IV SOLN: INTRAVENOUS | Status: DC

## 2015-12-21 MED ORDER — SODIUM CHLORIDE 0.9 % IV SOLN
INTRAVENOUS | Status: DC
  Administered 2015-12-21: 07:00:00 via INTRAVENOUS

## 2015-12-21 MED ORDER — PROPOFOL 500 MG/50ML IV EMUL
INTRAVENOUS | Status: DC | PRN
  Administered 2015-12-21: 120 ug/kg/min via INTRAVENOUS

## 2015-12-21 MED ORDER — PROPOFOL 10 MG/ML IV BOLUS
INTRAVENOUS | Status: DC | PRN
  Administered 2015-12-21: 80 mg via INTRAVENOUS

## 2015-12-21 MED ORDER — LIDOCAINE 2% (20 MG/ML) 5 ML SYRINGE
INTRAMUSCULAR | Status: DC | PRN
  Administered 2015-12-21: 30 mg via INTRAVENOUS

## 2015-12-21 MED ORDER — FENTANYL CITRATE (PF) 100 MCG/2ML IJ SOLN
INTRAMUSCULAR | Status: DC | PRN
  Administered 2015-12-21: 50 ug via INTRAVENOUS

## 2015-12-21 NOTE — Op Note (Signed)
[facility] Gastroenterology Patient Name: Cheryl Bird Procedure Date: 12/21/2015 7:44 AM MRN: [id number] Account #: [id number] Date of Birth: 30-Mar-1946 Admit Type: Outpatient Age: 70 Room: [facility] ENDO ROOM 4 Gender: Female Note Status: Finalized Procedure:            Upper GI endoscopy Indications:          Follow-up of Barrett's esophagus, Diarrhea Providers:            Lollie Sails, MD Referring MD:         Ria Bush (Referring MD) Medicines:            Monitored Anesthesia Care Complications:        No immediate complications. Procedure:            Pre-Anesthesia Assessment:                       - ASA Grade Assessment: III - A patient with severe                        systemic disease.                       After obtaining informed consent, the endoscope was                        passed under direct vision. Throughout the procedure,                        the patient's blood pressure, pulse, and oxygen                        saturations were monitored continuously. The Endoscope                        was introduced through the mouth, and advanced to the                        third part of duodenum. The upper GI endoscopy was                        accomplished without difficulty. The patient tolerated                        the procedure well. Findings:      The Z-line was variable. Biopsies were taken with a cold forceps for       histology.      Three tongues of salmon-colored mucosa were present. No other visible       abnormalities were present. The maximum longitudinal extent of these       esophageal mucosal changes was 2 cm in length. Mucosa was biopsied with       a cold forceps for histology in 4 quadrants.      The exam of the esophagus was otherwise normal.      Diffuse and patchy mild inflammation characterized by adherent blood,       congestion (edema) and erythema was found in the gastric body. Biopsies       were taken  with a cold forceps for histology. Biopsies were taken with a       cold forceps for Helicobacter pylori testing.  The cardia and gastric fundus were normal on retroflexion.      Diffuse mild mucosal variance characterized by altered texture was found       in the entire duodenum. Biopsies were taken with a cold forceps for       histology. Impression:           - Z-line variable. Biopsied.                       - Salmon-colored mucosa suspicious for short-segment                        Barrett's esophagus. Biopsied.                       - Gastritis. Biopsied.                       - Mucosal variant in the duodenum. Biopsied. Recommendation:       - Discharge patient to home.                       - Await pathology results.                       - Use Aciphex (rabeprazole) 20 mg PO daily.                       - Return to GI clinic in 5 weeks. Procedure Code(s):    --- Professional ---                       [phone], Esophagogastroduodenoscopy, flexible, transoral;                        with biopsy, single or multiple Diagnosis Code(s):    --- Professional ---                       K22.8, Other specified diseases of esophagus                       K22.70, Barrett's esophagus without dysplasia                       K29.70, Gastritis, unspecified, without bleeding                       K31.89, Other diseases of stomach and duodenum                       R19.7, Diarrhea, unspecified CPT copyright 2016 American Medical Association. All rights reserved. The codes documented in this report are preliminary and upon coder review may  be revised to meet current compliance requirements. Lollie Sails, MD 12/21/2015 8:10:49 AM This report has been signed electronically. Number of Addenda: 0 Note Initiated On: 12/21/2015 7:44 AM      [facility]

## 2015-12-21 NOTE — Anesthesia Postprocedure Evaluation (Signed)
Anesthesia Post Note  Patient: [name]  Procedure(s) Performed: Procedure(s) (LRB): ESOPHAGOGASTRODUODENOSCOPY (EGD) WITH PROPOFOL (N/A) COLONOSCOPY WITH PROPOFOL (N/A)  Patient location during evaluation: PACU Anesthesia Type: General Level of consciousness: awake and alert Pain management: pain level controlled Vital Signs Assessment: post-procedure vital signs reviewed and stable Respiratory status: spontaneous breathing, nonlabored ventilation, respiratory function stable and patient connected to nasal cannula oxygen Cardiovascular status: blood pressure returned to baseline and stable Postop Assessment: no signs of nausea or vomiting Anesthetic complications: no    Last Vitals:  Vitals:   12/21/15 0820 12/21/15 0828  BP: (!) 116/52 (!) 116/52  Pulse: 84 84  Resp: 16 16  Temp: 36.3 C (!) 35.8 C    Last Pain:  Vitals:   12/21/15 0820  TempSrc: Tympanic                 Molli Barrows

## 2015-12-21 NOTE — Transfer of Care (Signed)
Immediate Anesthesia Transfer of Care Note  Patient: Cheryl Bird  Procedure(s) Performed: Procedure(s): ESOPHAGOGASTRODUODENOSCOPY (EGD) WITH PROPOFOL (N/A) COLONOSCOPY WITH PROPOFOL (N/A)  Patient Location: PACU and Endoscopy Unit  Anesthesia Type:General  Level of Consciousness: sedated  Airway & Oxygen Therapy: Patient Spontanous Breathing and Patient connected to nasal cannula oxygen  Post-op Assessment: Report given to RN and Post -op Vital signs reviewed and stable  Post vital signs: Reviewed and stable  Last Vitals:  Vitals:   12/21/15 0704  BP: (!) 159/52  Pulse: 90  Resp: 16    Last Pain:  Vitals:   12/21/15 0704  TempSrc: Tympanic         Complications: No apparent anesthesia complications

## 2015-12-21 NOTE — H&P (Signed)
Outpatient short stay form Pre-procedure 12/21/2015 7:45 AM Lollie Sails MD  Primary Physician: Dr. Danise Mina  Reason for visit:  EGD and colonoscopy   History of present illness:  Patient is a 70 year old female presents today as above. She has a personal history of S esophagus however on her last EGD in 2015 this was not demonstrated. This will be a recheck A. Further she is had problems with chronic diarrhea for at least a year. She takes both a proton pump inhibitor as well as metformin. sHe does have a personal history of adenomatous colon polyps. She tolerated her prep well. She takes no aspirin or blood thinning agents.  Current Facility-Administered Medications:  .  0.9 %  sodium chloride infusion, , Intravenous, Continuous, Lollie Sails, MD, Last Rate: 20 mL/hr at 12/21/15 0723 .  0.9 %  sodium chloride infusion, , Intravenous, Continuous, Lollie Sails, MD  Prescriptions Prior to Admission  Medication Sig Dispense Refill Last Dose  . ACCU-CHEK SOFTCLIX LANCETS lancets Use to check sugar once daily and as needed Dx: E11.9 100 each 3 Taking  . albuterol (PROVENTIL HFA;VENTOLIN HFA) 108 (90 BASE) MCG/ACT inhaler Inhale 2 puffs into the lungs every 6 (six) hours as needed for wheezing. 1 Inhaler 3 12/20/2015 at Unknown time  . Alcohol Swabs PADS Use to clean finger prior to testing blood sugar. Dx: E11.9 100 each 3 Taking  . atorvastatin (LIPITOR) 20 MG tablet TAKE 1 TABLET (20 MG TOTAL) BY MOUTH NIGHTLY 90 tablet 3 12/20/2015 at Unknown time  . Calcium Carbonate-Vitamin D (CALCIUM 600/VITAMIN D) 600-400 MG-UNIT chew tablet Chew 1 tablet by mouth daily.   Past Week at Unknown time  . JANUVIA 50 MG tablet TAKE 1 TABLET (50 MG TOTAL) BY MOUTH DAILY. 90 tablet 3 12/20/2015 at Unknown time  . lisinopril (PRINIVIL,ZESTRIL) 20 MG tablet TAKE 1 TABLET (20 MG TOTAL) BY MOUTH DAILY. 90 tablet 3 12/20/2015 at Unknown time  . metFORMIN (GLUCOPHAGE) 1000 MG tablet TAKE 1 TABLET BY MOUTH  TWICE A DAY WITH A MEAL 180 tablet 3 12/20/2015 at Unknown time  . oxybutynin (DITROPAN) 5 MG tablet Take 1 tablet (5 mg total) by mouth 2 (two) times daily as needed (incontinence). 50 tablet 1 12/20/2015 at Unknown time  . pantoprazole (PROTONIX) 40 MG tablet Take 40 mg by mouth daily.   12/20/2015 at Unknown time  . glucose blood (ACCU-CHEK SMARTVIEW) test strip 1 each by Other route as directed. Use as instructed to check blood sugar once daily and as needed. Dx: E11.9 **NANO SMARTVIEW** 100 each 3 Taking     Allergies  Allergen Reactions  . Aleve [Naproxen Sodium] Itching     Past Medical History:  Diagnosis Date  . Adenomatous rectal polyp 09/2004   Nicolasa Ducking)  . Barrett esophagus 02/2012   on EGD biopsy Gustavo Lah)  . COPD, moderate ([place]) 02/12/2013   Spirometry - 01/2013: FEV1 = 59%, post alb 78%, ratio 0.6, mod severe obstruction, good response to bronchodilator   . Diabetes type 2, controlled ([place])    DMSE @ [facility] 11/2011  . Diverticulosis 09/2004   by colonoscopy  . Ex-smoker 01/2013  . GERD (gastroesophageal reflux disease)   . History of cleft palate   . HLD (hyperlipidemia)   . HTN (hypertension)    dx per prior records  . Osteopenia 05/2015   T -1.4 femur, foreamr -0.7  . Right-sided sensorineural hearing loss    from infection as child, hsa been seen by Dr. Pryor Ochoa  Review of systems:      Physical Exam    Heart and lungs: Regular rate and rhythm without rub or gallop, lungs are bilaterally clear    HEENT:  normocephalic atraumatic eyes are anicteric    Other:     Pertinant exam for procedure:  soft nontender nondistended bowel sounds positive normoactive    Planned proceedures:  EGD and colonoscopy with indicated procedures. I have discussed the risks benefits and complications of procedures to include not limited to bleeding, infection, perforation and the risk of sedation and the patient wishes to proceed.    Lollie Sails,  MD Gastroenterology 12/21/2015  7:45 AM

## 2015-12-21 NOTE — Anesthesia Preprocedure Evaluation (Signed)
Anesthesia Evaluation  Patient identified by MRN, date of birth, ID band Patient awake    Reviewed: Allergy & Precautions, H&P , NPO status , Patient's Chart, lab work & pertinent test results, reviewed documented beta blocker date and time   Airway Mallampati: II   Neck ROM: full    Dental  (+) Upper Dentures, Lower Dentures   Pulmonary neg pulmonary ROS, COPD,  COPD inhaler, former smoker,    Pulmonary exam normal        Cardiovascular hypertension, negative cardio ROS Normal cardiovascular exam Rhythm:regular Rate:Normal     Neuro/Psych negative neurological ROS  negative psych ROS   GI/Hepatic negative GI ROS, Neg liver ROS, GERD  Medicated,  Endo/Other  negative endocrine ROSdiabetes  Renal/GU negative Renal ROS  negative genitourinary   Musculoskeletal   Abdominal   Peds  Hematology negative hematology ROS (+)   Anesthesia Other Findings Past Medical History: 09/2004: Adenomatous rectal polyp     Comment: Nicolasa Ducking) 02/2012: Barrett esophagus     Comment: on EGD biopsy Gustavo Lah) 02/12/2013: COPD, moderate ([place])     Comment: Spirometry - 01/2013: FEV1 = 59%, post alb               78%, ratio 0.6, mod severe obstruction, good               response to bronchodilator  No date: Diabetes type 2, controlled [facility])     Comment: DMSE @ [facility] 11/2011 09/2004: Diverticulosis     Comment: by colonoscopy 01/2013: Ex-smoker No date: GERD (gastroesophageal reflux disease) No date: History of cleft palate No date: HLD (hyperlipidemia) No date: HTN (hypertension)     Comment: dx per prior records 05/2015: Osteopenia     Comment: T -1.4 femur, foreamr -0.7 No date: Right-sided sensorineural hearing loss     Comment: from infection as child, hsa been seen by Dr.               Pryor Ochoa Past Surgical History: No date: BLADDER SURGERY     Comment: sling 2013: CATARACT EXTRACTION     Comment: Right 10/08/2004: COLONOSCOPY    Comment: rectal polyp (tubular adenoma), diverticulosis              - Dr. Nicolasa Ducking 02/2012: COLONOSCOPY     Comment: multiple polyps - tubular and sessile               adenomas, diverticulosis, decreased rectal tone              Gustavo Lah) rec rpt 3 yrs 02/2012: ESOPHAGOGASTRODUODENOSCOPY     Comment: irregular z line (+barretts), normal stomach               and duodenum, rpt EGD 1 yr 05/2013: ESOPHAGOGASTRODUODENOSCOPY     Comment: irregular z line (+barretts), HH, reflux               gastroesophagitis, rpt 2 yrs No date: TONSILLECTOMY AND ADENOIDECTOMY No date: TUBAL LIGATION   Reproductive/Obstetrics                             Anesthesia Physical Anesthesia Plan  ASA: III  Anesthesia Plan: General   Post-op Pain Management:    Induction:   Airway Management Planned:   Additional Equipment:   Intra-op Plan:   Post-operative Plan:   Informed Consent: I have reviewed the patients History and Physical, chart, labs and discussed the procedure including the risks, benefits  and alternatives for the proposed anesthesia with the patient or authorized representative who has indicated his/her understanding and acceptance.   Dental Advisory Given  Plan Discussed with: CRNA  Anesthesia Plan Comments:         Anesthesia Quick Evaluation

## 2015-12-21 NOTE — Transfer of Care (Signed)
Immediate Anesthesia Transfer of Care Note  Patient: Cheryl Bird  Procedure(s) Performed: Procedure(s): ESOPHAGOGASTRODUODENOSCOPY (EGD) WITH PROPOFOL (N/A) COLONOSCOPY WITH PROPOFOL (N/A)  Patient Location: PACU and Endoscopy Unit  Anesthesia Type:General  Level of Consciousness: sedated  Airway & Oxygen Therapy: Patient Spontanous Breathing and Patient connected to nasal cannula oxygen  Post-op Assessment: Report given to RN and Post -op Vital signs reviewed and stable  Post vital signs: stable  Last Vitals:  Vitals:   12/21/15 0820 12/21/15 0828  BP: (!) 116/52 (!) 116/52  Pulse: 84 84  Resp: 16 16  Temp: 36.3 C (!) 35.8 C    Last Pain:  Vitals:   12/21/15 0820  TempSrc: Tympanic         Complications: No apparent anesthesia complications

## 2015-12-21 NOTE — Op Note (Signed)
[facility] Gastroenterology Patient Name: Cheryl Bird Procedure Date: 12/21/2015 8:10 AM MRN: [id number] Account #: [id number] Date of Birth: 1946/02/12 Admit Type: Outpatient Age: 70 Room: [facility] ENDO ROOM 4 Gender: Female Note Status: Finalized Procedure:            Colonoscopy Indications:          Personal history of colonic polyps, Chronic diarrhea Providers:            Lollie Sails, MD Referring MD:         Ria Bush (Referring MD) Medicines:            Monitored Anesthesia Care Complications:        No immediate complications. Procedure:            Pre-Anesthesia Assessment:                       - ASA Grade Assessment: III - A patient with severe                        systemic disease.                       After obtaining informed consent, the colonoscope was                        passed under direct vision. Throughout the procedure,                        the patient's blood pressure, pulse, and oxygen                        saturations were monitored continuously. The                        Colonoscope was introduced through the anus with the                        intention of advancing to the cecum. The scope was                        advanced to the sigmoid colon before the procedure was                        aborted. Medications were given. The colonoscopy was                        extremely difficult due to poor bowel prep with stool                        present. The patient tolerated the procedure well. Findings:      A large amount of solid stool was found in the rectum and in the sigmoid       colon, precluding visualization.      The digital rectal exam was normal. Impression:           - Stool in the rectum and in the sigmoid colon.                       - No specimens collected. Recommendation:       - Discharge patient to home.                       -  reprep and reschedule. Procedure Code(s):    --- Professional ---                       [phone], 5, Colonoscopy, flexible; diagnostic, including                        collection of specimen(s) by brushing or washing, when                        performed (separate procedure) Diagnosis Code(s):    --- Professional ---                       Z86.010, Personal history of colonic polyps                       K52.9, Noninfective gastroenteritis and colitis,                        unspecified CPT copyright 2016 American Medical Association. All rights reserved. The codes documented in this report are preliminary and upon coder review may  be revised to meet current compliance requirements. Lollie Sails, MD 12/21/2015 8:25:00 AM This report has been signed electronically. Number of Addenda: 0 Note Initiated On: 12/21/2015 8:10 AM Total Procedure Duration: 0 hours 3 minutes 10 seconds       [facility]

## 2015-12-23 ENCOUNTER — Encounter: Payer: Self-pay | Admitting: Family Medicine

## 2015-12-23 LAB — SURGICAL PATHOLOGY

## 2015-12-24 ENCOUNTER — Encounter: Payer: Self-pay | Admitting: Family Medicine

## 2015-12-26 ENCOUNTER — Encounter: Payer: Self-pay | Admitting: Family Medicine

## 2016-01-15 ENCOUNTER — Encounter: Payer: Self-pay | Admitting: Family Medicine

## 2016-01-24 DIAGNOSIS — E113299 Type 2 diabetes mellitus with mild nonproliferative diabetic retinopathy without macular edema, unspecified eye: Secondary | ICD-10-CM

## 2016-01-24 HISTORY — DX: Type 2 diabetes mellitus with mild nonproliferative diabetic retinopathy without macular edema, unspecified eye: E11.3299

## 2016-01-29 DIAGNOSIS — H43811 Vitreous degeneration, right eye: Secondary | ICD-10-CM | POA: Diagnosis not present

## 2016-01-29 LAB — HM DIABETES EYE EXAM

## 2016-02-01 ENCOUNTER — Encounter: Payer: Self-pay | Admitting: Family Medicine

## 2016-02-01 DIAGNOSIS — E113299 Type 2 diabetes mellitus with mild nonproliferative diabetic retinopathy without macular edema, unspecified eye: Secondary | ICD-10-CM

## 2016-02-02 DIAGNOSIS — K2270 Barrett's esophagus without dysplasia: Secondary | ICD-10-CM | POA: Diagnosis not present

## 2016-02-02 DIAGNOSIS — Z86010 Personal history of colonic polyps: Secondary | ICD-10-CM | POA: Diagnosis not present

## 2016-02-02 DIAGNOSIS — A048 Other specified bacterial intestinal infections: Secondary | ICD-10-CM | POA: Diagnosis not present

## 2016-02-03 ENCOUNTER — Telehealth: Payer: Self-pay | Admitting: *Deleted

## 2016-02-03 NOTE — Telephone Encounter (Signed)
Form for diabetic testing supplies in your IN box 

## 2016-02-04 ENCOUNTER — Encounter: Payer: Self-pay | Admitting: Gastroenterology

## 2016-02-04 NOTE — Telephone Encounter (Signed)
Paperwork faxed °

## 2016-02-04 NOTE — Telephone Encounter (Signed)
Signed and in Kim's box. 

## 2016-02-09 ENCOUNTER — Telehealth: Payer: Self-pay

## 2016-02-09 NOTE — Telephone Encounter (Signed)
Tamsen Snider NP with [facility] is doing annual screening visit and wanted Dr Darnell Level to know that pt has 2 + protein in urine today. No other findings and no complaints from pt. Pt is diabetic and Candace request cb to pt with any recommendations.

## 2016-02-16 NOTE — Telephone Encounter (Signed)
Dwayne from [facility] called asking about proteinuria and decreased circulation right foot. Patient hasn't heard anything back and is concerned. She would like a call back today or tomorrow.

## 2016-02-16 NOTE — Telephone Encounter (Signed)
Patient already on lisinopril for proteinuria.  Please get details on right foot circulation - as I had not received that message.

## 2016-02-17 NOTE — Telephone Encounter (Signed)
Patient notified. [place] [facility] for further info on foot circulation @ [phone].  NP performed PAD screening that read 0.89 right 1.18 left. They will fax report as soon as it's completed.

## 2016-03-11 ENCOUNTER — Other Ambulatory Visit: Payer: Self-pay | Admitting: Family Medicine

## 2016-05-03 ENCOUNTER — Other Ambulatory Visit: Payer: Self-pay | Admitting: Family Medicine

## 2016-05-03 DIAGNOSIS — Z1231 Encounter for screening mammogram for malignant neoplasm of breast: Secondary | ICD-10-CM

## 2016-05-08 ENCOUNTER — Other Ambulatory Visit: Payer: Self-pay | Admitting: Family Medicine

## 2016-05-26 ENCOUNTER — Other Ambulatory Visit: Payer: Self-pay | Admitting: Family Medicine

## 2016-05-26 DIAGNOSIS — E119 Type 2 diabetes mellitus without complications: Secondary | ICD-10-CM

## 2016-05-26 DIAGNOSIS — R7989 Other specified abnormal findings of blood chemistry: Secondary | ICD-10-CM

## 2016-05-26 DIAGNOSIS — E785 Hyperlipidemia, unspecified: Secondary | ICD-10-CM

## 2016-05-27 ENCOUNTER — Ambulatory Visit (INDEPENDENT_AMBULATORY_CARE_PROVIDER_SITE_OTHER): Payer: Medicare Other

## 2016-05-27 ENCOUNTER — Encounter: Payer: Self-pay | Admitting: *Deleted

## 2016-05-27 VITALS — BP 136/80 | HR 96 | Temp 97.7°F | Ht 60.75 in | Wt 141.5 lb

## 2016-05-27 DIAGNOSIS — E119 Type 2 diabetes mellitus without complications: Secondary | ICD-10-CM | POA: Diagnosis not present

## 2016-05-27 DIAGNOSIS — R7989 Other specified abnormal findings of blood chemistry: Secondary | ICD-10-CM

## 2016-05-27 DIAGNOSIS — R8290 Unspecified abnormal findings in urine: Secondary | ICD-10-CM

## 2016-05-27 DIAGNOSIS — R350 Frequency of micturition: Secondary | ICD-10-CM

## 2016-05-27 DIAGNOSIS — N3944 Nocturnal enuresis: Secondary | ICD-10-CM | POA: Diagnosis not present

## 2016-05-27 DIAGNOSIS — Z0000 Encounter for general adult medical examination without abnormal findings: Secondary | ICD-10-CM

## 2016-05-27 DIAGNOSIS — Z23 Encounter for immunization: Secondary | ICD-10-CM | POA: Diagnosis not present

## 2016-05-27 DIAGNOSIS — R946 Abnormal results of thyroid function studies: Secondary | ICD-10-CM | POA: Diagnosis not present

## 2016-05-27 DIAGNOSIS — E785 Hyperlipidemia, unspecified: Secondary | ICD-10-CM

## 2016-05-27 LAB — COMPREHENSIVE METABOLIC PANEL
ALBUMIN: 4.2 g/dL (ref 3.5–5.2)
ALK PHOS: 67 U/L (ref 39–117)
ALT: 32 U/L (ref 0–35)
AST: 33 U/L (ref 0–37)
BUN: 18 mg/dL (ref 6–23)
CALCIUM: 9.9 mg/dL (ref 8.4–10.5)
CHLORIDE: 110 meq/L (ref 96–112)
CO2: 27 mEq/L (ref 19–32)
CREATININE: 0.91 mg/dL (ref 0.40–1.20)
GFR: 64.82 mL/min (ref >60.00)
Glucose, Bld: 118 mg/dL — ABNORMAL HIGH (ref 70–99)
POTASSIUM: 4.8 meq/L (ref 3.5–5.1)
Sodium: 140 mEq/L (ref 135–145)
TOTAL PROTEIN: 7 g/dL (ref 6.0–8.3)
Total Bilirubin: 0.5 mg/dL (ref 0.2–1.2)

## 2016-05-27 LAB — LIPID PANEL
CHOL/HDL RATIO: 3
Cholesterol: 149 mg/dL (ref 0–200)
HDL: 59.4 mg/dL (ref >39.00)
LDL Cholesterol: 71 mg/dL (ref 0–99)
NonHDL: 89.77
TRIGLYCERIDES: 93 mg/dL (ref 0.0–149.0)
VLDL: 18.6 mg/dL (ref 0.0–40.0)

## 2016-05-27 LAB — POC URINALSYSI DIPSTICK (AUTOMATED)
Bilirubin, UA: NEGATIVE
Glucose, UA: NEGATIVE
KETONES UA: NEGATIVE
Leukocytes, UA: NEGATIVE
Nitrite, UA: POSITIVE
PH UA: 5.5
RBC UA: NEGATIVE
UROBILINOGEN UA: 0.2

## 2016-05-27 LAB — TSH: TSH: 3.06 u[IU]/mL (ref 0.35–4.50)

## 2016-05-27 LAB — HEMOGLOBIN A1C: Hgb A1c MFr Bld: 7 % — ABNORMAL HIGH (ref 4.6–6.5)

## 2016-05-27 NOTE — Patient Instructions (Signed)
Cheryl Bird , Thank you for taking time to come for your Medicare Wellness Visit. I appreciate your ongoing commitment to your health goals. Please review the following plan we discussed and let me know if I can assist you in the future.   These are the goals we discussed: Goals    . Increase physical activity          Starting 2/2/218, I will continue to walk at least 30 min 6 days per week.        This is a list of the screening recommended for you and due dates:  Health Maintenance  Topic Date Due  . Complete foot exam   06/22/2016*  . DTaP/Tdap/Td vaccine (1 - Tdap) 12/22/2021*  . Shingles Vaccine  05/27/2025*  . Hemoglobin A1C  06/01/2016  . Eye exam for diabetics  01/28/2017  . Mammogram  06/14/2017  . Colon Cancer Screening  12/21/2018  . Tetanus Vaccine  12/22/2021  . Flu Shot  Completed  . DEXA scan (bone density measurement)  Completed  .  Hepatitis C: One time screening is recommended by Center for Disease Control  (CDC) for  adults born from [birth year] through 1965.   Completed  . Pneumonia vaccines  Completed  *Topic was postponed. The date shown is not the original due date.   Preventive Care for Adults  A healthy lifestyle and preventive care can promote health and wellness. Preventive health guidelines for adults include the following key practices.  . A routine yearly physical is a good way to check with your health care provider about your health and preventive screening. It is a chance to share any concerns and updates on your health and to receive a thorough exam.  . Visit your dentist for a routine exam and preventive care every 6 months. Brush your teeth twice a day and floss once a day. Good oral hygiene prevents tooth decay and gum disease.  . The frequency of eye exams is based on your age, health, family medical history, use  of contact lenses, and other factors. Follow your health care provider's ecommendations for frequency of eye exams.  . Eat a healthy  diet. Foods like vegetables, fruits, whole grains, low-fat dairy products, and lean protein foods contain the nutrients you need without too many calories. Decrease your intake of foods high in solid fats, added sugars, and salt. Eat the right amount of calories for you. Get information about a proper diet from your health care provider, if necessary.  . Regular physical exercise is one of the most important things you can do for your health. Most adults should get at least 150 minutes of moderate-intensity exercise (any activity that increases your heart rate and causes you to sweat) each week. In addition, most adults need muscle-strengthening exercises on 2 or more days a week.  Silver Sneakers may be a benefit available to you. To determine eligibility, you may visit the website: www.silversneakers.com or contact program at (940)759-5797 Mon-Fri between 8AM-8PM.   . Maintain a healthy weight. The body mass index (BMI) is a screening tool to identify possible weight problems. It provides an estimate of body fat based on height and weight. Your health care provider can find your BMI and can help you achieve or maintain a healthy weight.   For adults 20 years and older: ? A BMI below 18.5 is considered underweight. ? A BMI of 18.5 to 24.9 is normal. ? A BMI of 25 to 29.9 is considered overweight. ?  A BMI of 30 and above is considered obese.   . Maintain normal blood lipids and cholesterol levels by exercising and minimizing your intake of saturated fat. Eat a balanced diet with plenty of fruit and vegetables. Blood tests for lipids and cholesterol should begin at age 9 and be repeated every 5 years. If your lipid or cholesterol levels are high, you are over 50, or you are at high risk for heart disease, you may need your cholesterol levels checked more frequently. Ongoing high lipid and cholesterol levels should be treated with medicines if diet and exercise are not working.  . If you smoke, find  out from your health care provider how to quit. If you do not use tobacco, please do not start.  . If you choose to drink alcohol, please do not consume more than 2 drinks per day. One drink is considered to be 12 ounces (355 mL) of beer, 5 ounces (148 mL) of wine, or 1.5 ounces (44 mL) of liquor.  . If you are 48-45 years old, ask your health care provider if you should take aspirin to prevent strokes.  . Use sunscreen. Apply sunscreen liberally and repeatedly throughout the day. You should seek shade when your shadow is shorter than you. Protect yourself by wearing long sleeves, pants, a wide-brimmed hat, and sunglasses year round, whenever you are outdoors.  . Once a month, do a whole body skin exam, using a mirror to look at the skin on your back. Tell your health care provider of new moles, moles that have irregular borders, moles that are larger than a pencil eraser, or moles that have changed in shape or color.

## 2016-05-27 NOTE — Progress Notes (Signed)
Pre visit review using our clinic review tool, if applicable. No additional management support is needed unless otherwise documented below in the visit note. 

## 2016-05-27 NOTE — Progress Notes (Signed)
PCP notes:   Health maintenance:  Flu vaccine - administered A1C - completed Foot exam - pt will complete with PCP at next appt Shingles - pt declined  Abnormal screenings:   Hearing (left ear only) - failed  Patient concerns:   Pt has complaint of urinary frequency and nocturnal enuresis.  Frequency: How many times an hour do you urinate? - Unable to provide specific response Dysuria: (Burning or pain on urination) - denies Hematuria: (Blood in urine) - denies Urgency: (sudden need to urinate) - confirms Nocturia: (awakening during sleep to urinate) - confirms How many times during your sleep? 4-5 Incontinence: (loss of control) - wears pads; bed wetting at night Back pain: - denies Fever: - denies How long (days) have you had these symptoms? - since August 2017 Have you had a previous urinary tract infection(UTI)? - no Have you ever had an infection of the kidney? - No Have you taken any medication for current symptoms? - No Do you drink caffeinated beverages? (soft drinks/coffee/tea) -Yes  If yes, how many ounces per day? - 8  Are you sexually active? - No  Nurse concerns:  PCP notified about patient's concerns. Urinalysis ordered. Abnormal results. PCP notified. UA C&S ordered.   Next PCP appt:   06/03/16 @ 1500

## 2016-05-27 NOTE — Progress Notes (Signed)
Subjective:   Cheryl Bird is a 71 y.o. female who presents for Medicare Annual (Subsequent) preventive examination.  Review of Systems:  N/A Cardiac Risk Factors include: advanced age (>56men, >75 women);diabetes mellitus;dyslipidemia;hypertension;smoking/ tobacco exposure     Objective:     Vitals: BP 136/80 (BP Location: Right Arm, Patient Position: Sitting, Cuff Size: Normal)   Pulse 96   Temp 97.7 F (36.5 C) (Oral)   Ht 5' 0.75" (1.543 m) Comment: no shoes  Wt 141 lb 8 oz (64.2 kg)   SpO2 96%   BMI 26.96 kg/m   Body mass index is 26.96 kg/m.   Tobacco History  Smoking Status  . Current Some Day Smoker  . Packs/day: 0.30  . Years: 45.00  . Types: Cigarettes  . Start date: 04/25/1968  Smokeless Tobacco  . Never Used    Comment: smokes 1 pack week      Ready to quit: No Counseling given: No   Past Medical History:  Diagnosis Date  . Adenomatous rectal polyp 09/2004   Nicolasa Ducking)  . Background diabetic retinopathy ([place]) 01/2016  . Barrett esophagus 02/2012   on EGD biopsy Gustavo Lah)  . COPD, moderate ([place]) 02/12/2013   Spirometry - 01/2013: FEV1 = 59%, post alb 78%, ratio 0.6, mod severe obstruction, good response to bronchodilator   . Diabetes type 2, controlled ([place])    DMSE @ [facility] 11/2011  . Diverticulosis 09/2004   by colonoscopy  . Ex-smoker 01/2013  . GERD (gastroesophageal reflux disease)   . History of cleft palate   . HLD (hyperlipidemia)   . HTN (hypertension)    dx per prior records  . Osteopenia 05/2015   T -1.4 femur, foreamr -0.7  . Right-sided sensorineural hearing loss    from infection as child, hsa been seen by Dr. Pryor Ochoa   Past Surgical History:  Procedure Laterality Date  . BLADDER SUSPENSION    . CATARACT EXTRACTION  2013   Right  . COLONOSCOPY  10/08/2004   rectal polyp (tubular adenoma), diverticulosis - Dr. Nicolasa Ducking  . COLONOSCOPY  02/2012   multiple polyps - tubular and sessile adenomas, diverticulosis, decreased rectal tone  ([place]) rec rpt 3 yrs  . COLONOSCOPY  2017   stool present, to reschedule Gustavo Lah)  . COLONOSCOPY WITH PROPOFOL N/A 12/21/2015   Procedure: COLONOSCOPY WITH PROPOFOL;  Surgeon: Lollie Sails, MD;  [facility]COPY;  Service: Endoscopy;  Laterality: N/A;  . ESOPHAGOGASTRODUODENOSCOPY  02/2012   irregular z line (+barretts), normal stomach and duodenum, rpt EGD 1 yr  . ESOPHAGOGASTRODUODENOSCOPY  05/2013   irregular z line (+barretts), HH, reflux gastroesophagitis, rpt 2 yrs  . ESOPHAGOGASTRODUODENOSCOPY  11/2015   chronic gastritis, reflux esophagitis, + barrett's, + H pylori, rpt 2 yrs ([place])  . ESOPHAGOGASTRODUODENOSCOPY (EGD) WITH PROPOFOL N/A 12/21/2015   Procedure: ESOPHAGOGASTRODUODENOSCOPY (EGD) WITH PROPOFOL;  Surgeon: Lollie Sails, MD;  Location: [facility] ENDOSCOPY;  Service: Endoscopy;  Laterality: N/A;  . EYE SURGERY    . TONSILLECTOMY AND ADENOIDECTOMY    . TUBAL LIGATION     Family History  Problem Relation Age of Onset  . Diabetes Maternal Aunt   . Alcohol abuse Mother   . Heart disease Mother     enlarged heart  . Kidney failure Mother   . Hypertension Mother   . Coronary artery disease Father 18  . Coronary artery disease Brother 66  . Cancer Sister     kidney   . Stroke Neg Hx   . Breast cancer  Neg Hx    History  Sexual Activity  . Sexual activity: No    Outpatient Encounter Prescriptions as of 05/27/2016  Medication Sig  . ACCU-CHEK SOFTCLIX LANCETS lancets Use to check sugar once daily and as needed Dx: E11.9  . albuterol (PROVENTIL HFA;VENTOLIN HFA) 108 (90 BASE) MCG/ACT inhaler Inhale 2 puffs into the lungs every 6 (six) hours as needed for wheezing.  . Alcohol Swabs PADS Use to clean finger prior to testing blood sugar. Dx: E11.9  . atorvastatin (LIPITOR) 20 MG tablet TAKE 1 TABLET (20 MG TOTAL) BY MOUTH NIGHTLY  . Calcium Carbonate-Vitamin D (CALCIUM 600/VITAMIN D) 600-400 MG-UNIT chew tablet Chew 1 tablet by mouth daily.  Marland Kitchen glucose  blood (ACCU-CHEK SMARTVIEW) test strip 1 each by Other route as directed. Use as instructed to check blood sugar once daily and as needed. Dx: E11.9 **NANO SMARTVIEW**  . JANUVIA 50 MG tablet TAKE 1 TABLET (50 MG TOTAL) BY MOUTH DAILY.  Marland Kitchen lisinopril (PRINIVIL,ZESTRIL) 20 MG tablet TAKE 1 TABLET (20 MG TOTAL) BY MOUTH DAILY.  . metFORMIN (GLUCOPHAGE) 1000 MG tablet TAKE 1 TABLET BY MOUTH TWICE A DAY WITH A MEAL  . oxybutynin (DITROPAN) 5 MG tablet TAKE 1 TABLET (5 MG TOTAL) BY MOUTH 2 (TWO) TIMES DAILY AS NEEDED (INCONTINENCE).  Marland Kitchen pantoprazole (PROTONIX) 40 MG tablet Take 40 mg by mouth daily.   No facility-administered encounter medications on file as of 05/27/2016.     Activities of Daily Living In your present state of health, do you have any difficulty performing the following activities: 05/27/2016  Hearing? Y  Vision? N  Difficulty concentrating or making decisions? N  Walking or climbing stairs? N  Dressing or bathing? N  Doing errands, shopping? N  Preparing Food and eating ? N  Using the Toilet? N  In the past six months, have you accidently leaked urine? Y  Do you have problems with loss of bowel control? N  Managing your Medications? N  Managing your Finances? N  Housekeeping or managing your Housekeeping? N  Some recent data might be hidden    Patient Care Team: Ria Bush, MD as PCP - General (Family Medicine) Estill Cotta, MD as Ophthalmologist Assessment:     Hearing Screening   125Hz  250Hz  500Hz  1000Hz  2000Hz  3000Hz  4000Hz  6000Hz  8000Hz   Right ear:           Left ear:   0 0 40  40    Comments: Legally deaf in right ear  Vision Screening Comments: Last vision exam in Dec 2017 with Dr. Sandra Cockayne   Exercise Activities and Dietary recommendations Current Exercise Habits: Home exercise routine, Type of exercise: walking, Time (Minutes): 30, Frequency (Times/Week): 6, Weekly Exercise (Minutes/Week): 180, Intensity: Mild, Exercise limited by: None  identified  Goals    . Increase physical activity          Starting 2/2/218, I will continue to walk at least 30 min 6 days per week.       Fall Risk Fall Risk  05/27/2016 06/02/2015 05/28/2014 05/13/2013 01/04/2013  Falls in the past year? No No No No No  Risk for fall due to : - - - - -   Depression Screen PHQ 2/9 Scores 05/27/2016 06/02/2015 05/28/2014 05/13/2013  PHQ - 2 Score 0 0 0 0     Cognitive Function MMSE - Mini Mental State Exam 05/27/2016  Orientation to time 5  Orientation to Place 5  Registration 3  Attention/ Calculation 0  Recall 3  Language- name 2 objects 0  Language- repeat 1  Language- follow 3 step command 3  Language- read & follow direction 0  Write a sentence 0  Copy design 0  Total score 20     PLEASE NOTE: A Mini-Cog screen was completed. Maximum score is 20. A value of 0 denotes this part of Folstein MMSE was not completed or the patient failed this part of the Mini-Cog screening.   Mini-Cog Screening Orientation to Time - Max 5 pts Orientation to Place - Max 5 pts Registration - Max 3 pts Recall - Max 3 pts Language Repeat - Max 1 pts Language Follow 3 Step Command - Max 3 pts     Immunization History  Administered Date(s) Administered  . Influenza Split 03/26/2012  . Influenza,inj,Quad PF,36+ Mos 01/04/2013, 01/09/2014, 06/02/2015, 05/27/2016  . Pneumococcal Conjugate-13 01/09/2014  . Pneumococcal Polysaccharide-23 12/23/2011  . Td 12/23/2011   Screening Tests Health Maintenance  Topic Date Due  . FOOT EXAM  06/22/2016 (Originally 11/28/2015)  . DTaP/Tdap/Td (1 - Tdap) 12/22/2021 (Originally 12/24/2011)  . ZOSTAVAX  05/27/2025 (Originally 09/03/2005)  . HEMOGLOBIN A1C  06/01/2016  . OPHTHALMOLOGY EXAM  01/28/2017  . MAMMOGRAM  06/14/2017  . COLONOSCOPY  12/21/2018  . TETANUS/TDAP  12/22/2021  . INFLUENZA VACCINE  Completed  . DEXA SCAN  Completed  . Hepatitis C Screening  Completed  . PNA vac Low Risk Adult  Completed      Plan:     I  have personally reviewed and addressed the Medicare Annual Wellness questionnaire and have noted the following in the patient's chart:  A. Medical and social history B. Use of alcohol, tobacco or illicit drugs  C. Current medications and supplements D. Functional ability and status E.  Nutritional status F.  Physical activity G. Advance directives H. List of other physicians I.  Hospitalizations, surgeries, and ER visits in previous 12 months J.  [place] to include hearing, vision, cognitive, depression L. Referrals and appointments - none  In addition, I have reviewed and discussed with patient certain preventive protocols, quality metrics, and best practice recommendations. A written personalized care plan for preventive services as well as general preventive health recommendations were provided to patient.  See attached scanned questionnaire for additional information.   Signed,   Lindell Noe, MHA, BS, LPN Health Coach

## 2016-05-29 ENCOUNTER — Other Ambulatory Visit: Payer: Self-pay | Admitting: Family Medicine

## 2016-05-29 MED ORDER — SULFAMETHOXAZOLE-TRIMETHOPRIM 800-160 MG PO TABS: 1.0000 | ORAL_TABLET | Freq: Two times a day (BID) | ORAL | 0 refills | Status: DC

## 2016-05-29 NOTE — Progress Notes (Signed)
I reviewed health advisor's note, was available for consultation, and agree with documentation and plan.   Patient presented with symptoms concerning for UTI - UA abnormal as well. UCx ordered, will start keflex 500mg  BID 7d course. This was sent in to pharmacy.

## 2016-05-30 ENCOUNTER — Encounter
Admission: RE | Disposition: A | Discharge status: Home / Self Care | Payer: Self-pay | Source: Ambulatory Visit | Attending: Gastroenterology

## 2016-05-30 ENCOUNTER — Ambulatory Visit: Payer: Medicare Other | Admitting: Anesthesiology

## 2016-05-30 ENCOUNTER — Ambulatory Visit
Admission: RE | Admit: 2016-05-30 | Discharge: 2016-05-30 | Disposition: A | Discharge status: Home / Self Care | Payer: Medicare Other | Source: Ambulatory Visit | Attending: Gastroenterology | Admitting: Gastroenterology

## 2016-05-30 ENCOUNTER — Telehealth: Payer: Self-pay | Admitting: Family Medicine

## 2016-05-30 ENCOUNTER — Ambulatory Visit: Payer: Medicare Other

## 2016-05-30 ENCOUNTER — Other Ambulatory Visit: Payer: Medicare Other

## 2016-05-30 ENCOUNTER — Encounter: Payer: Self-pay | Admitting: *Deleted

## 2016-05-30 DIAGNOSIS — K5730 Diverticulosis of large intestine without perforation or abscess without bleeding: Secondary | ICD-10-CM | POA: Insufficient documentation

## 2016-05-30 DIAGNOSIS — K219 Gastro-esophageal reflux disease without esophagitis: Secondary | ICD-10-CM | POA: Diagnosis not present

## 2016-05-30 DIAGNOSIS — H905 Unspecified sensorineural hearing loss: Secondary | ICD-10-CM | POA: Insufficient documentation

## 2016-05-30 DIAGNOSIS — F17200 Nicotine dependence, unspecified, uncomplicated: Secondary | ICD-10-CM | POA: Insufficient documentation

## 2016-05-30 DIAGNOSIS — M8580 Other specified disorders of bone density and structure, unspecified site: Secondary | ICD-10-CM | POA: Diagnosis not present

## 2016-05-30 DIAGNOSIS — D125 Benign neoplasm of sigmoid colon: Secondary | ICD-10-CM | POA: Diagnosis not present

## 2016-05-30 DIAGNOSIS — D122 Benign neoplasm of ascending colon: Secondary | ICD-10-CM | POA: Insufficient documentation

## 2016-05-30 DIAGNOSIS — Z86010 Personal history of colonic polyps: Secondary | ICD-10-CM | POA: Insufficient documentation

## 2016-05-30 DIAGNOSIS — K621 Rectal polyp: Secondary | ICD-10-CM | POA: Diagnosis not present

## 2016-05-30 DIAGNOSIS — J449 Chronic obstructive pulmonary disease, unspecified: Secondary | ICD-10-CM | POA: Insufficient documentation

## 2016-05-30 DIAGNOSIS — Z1211 Encounter for screening for malignant neoplasm of colon: Secondary | ICD-10-CM | POA: Insufficient documentation

## 2016-05-30 DIAGNOSIS — D120 Benign neoplasm of cecum: Secondary | ICD-10-CM | POA: Diagnosis not present

## 2016-05-30 DIAGNOSIS — K5790 Diverticulosis of intestine, part unspecified, without perforation or abscess without bleeding: Secondary | ICD-10-CM | POA: Diagnosis not present

## 2016-05-30 DIAGNOSIS — Z7984 Long term (current) use of oral hypoglycemic drugs: Secondary | ICD-10-CM | POA: Diagnosis not present

## 2016-05-30 DIAGNOSIS — K635 Polyp of colon: Secondary | ICD-10-CM | POA: Diagnosis not present

## 2016-05-30 DIAGNOSIS — D124 Benign neoplasm of descending colon: Secondary | ICD-10-CM | POA: Insufficient documentation

## 2016-05-30 DIAGNOSIS — I10 Essential (primary) hypertension: Secondary | ICD-10-CM | POA: Diagnosis not present

## 2016-05-30 DIAGNOSIS — E785 Hyperlipidemia, unspecified: Secondary | ICD-10-CM | POA: Diagnosis not present

## 2016-05-30 DIAGNOSIS — E11319 Type 2 diabetes mellitus with unspecified diabetic retinopathy without macular edema: Secondary | ICD-10-CM | POA: Insufficient documentation

## 2016-05-30 HISTORY — PX: COLONOSCOPY WITH PROPOFOL: SHX5780

## 2016-05-30 LAB — URINE CULTURE

## 2016-05-30 SURGERY — COLONOSCOPY WITH PROPOFOL
Anesthesia: General

## 2016-05-30 MED ORDER — SODIUM CHLORIDE 0.9 % IV SOLN: INTRAVENOUS | Status: DC

## 2016-05-30 MED ORDER — EPHEDRINE SULFATE 50 MG/ML IJ SOLN
INTRAMUSCULAR | Status: DC | PRN
  Administered 2016-05-30: 15 mg via INTRAVENOUS

## 2016-05-30 MED ORDER — FENTANYL CITRATE (PF) 100 MCG/2ML IJ SOLN
INTRAMUSCULAR | Status: DC | PRN
  Administered 2016-05-30: 25 ug via INTRAVENOUS

## 2016-05-30 MED ORDER — SODIUM CHLORIDE 0.9 % IV SOLN
INTRAVENOUS | Status: DC
  Administered 2016-05-30: 10:00:00 via INTRAVENOUS

## 2016-05-30 MED ORDER — PHENYLEPHRINE HCL 10 MG/ML IJ SOLN
INTRAMUSCULAR | Status: DC | PRN
  Administered 2016-05-30 (×2): 100 ug via INTRAVENOUS

## 2016-05-30 MED ORDER — PROPOFOL 10 MG/ML IV BOLUS
INTRAVENOUS | Status: DC | PRN
  Administered 2016-05-30: 40 mg via INTRAVENOUS

## 2016-05-30 MED ORDER — FENTANYL CITRATE (PF) 100 MCG/2ML IJ SOLN
INTRAMUSCULAR | Status: AC
  Filled 2016-05-30: qty 2

## 2016-05-30 MED ORDER — PROPOFOL 500 MG/50ML IV EMUL
INTRAVENOUS | Status: DC | PRN
Start: 2016-05-30 — End: 2016-05-30
  Administered 2016-05-30: 140 ug/kg/min via INTRAVENOUS

## 2016-05-30 MED ORDER — LIDOCAINE HCL (PF) 2 % IJ SOLN
INTRAMUSCULAR | Status: AC
  Filled 2016-05-30: qty 2

## 2016-05-30 NOTE — Anesthesia Procedure Notes (Signed)
Date/Time: 05/30/2016 10:55 AM Performed by: Allean Found Pre-anesthesia Checklist: Patient identified, Emergency Drugs available, Suction available, Patient being monitored and Timeout performed Patient Re-evaluated:Patient Re-evaluated prior to inductionOxygen Delivery Method: Nasal cannula Intubation Type: IV induction

## 2016-05-30 NOTE — Op Note (Signed)
[facility] Gastroenterology Patient Name: Cheryl Bird Procedure Date: 05/30/2016 10:36 AM MRN: [id number] Account #: [id number] Date of Birth: 09-02-1945 Admit Type: Outpatient Age: 71 Room: [facility] ENDO ROOM 3 Gender: Female Note Status: Finalized Procedure:            Colonoscopy Indications:          Personal history of colonic polyps Providers:            Lollie Sails, MD Referring MD:         Ria Bush (Referring MD) Medicines:            Monitored Anesthesia Care Complications:        No immediate complications. Procedure:            Pre-Anesthesia Assessment:                       - ASA Grade Assessment: III - A patient with severe                        systemic disease.                       After obtaining informed consent, the colonoscope was                        passed under direct vision. Throughout the procedure,                        the patient's blood pressure, pulse, and oxygen                        saturations were monitored continuously. The                        Colonoscope was introduced through the anus and                        advanced to the the cecum, identified by appendiceal                        orifice and ileocecal valve. The colonoscopy was                        performed with moderate difficulty due to a tortuous                        colon. The patient tolerated the procedure well. The                        quality of the bowel preparation was fair. Findings:      A 2 mm polyp was found in the proximal descending colon. The polyp was       sessile. The polyp was removed with a cold biopsy forceps. Resection and       retrieval were complete.      A 5 mm polyp was found in the ascending colon. The polyp was sessile.       The polyp was removed with a cold snare. Resection and retrieval were       complete.      A 12 mm polyp was found in the cecum. The polyp  was sessile. The polyp       was removed with a  cold biopsy forceps. The polyp was removed with a       cold snare. The polyp was removed with a lift and cut technique using a       cold snare. The polyp was removed with a piecemeal technique using a       cold snare. Resection and retrieval were complete.      A 3 mm polyp was found in the proximal sigmoid colon. The polyp was       sessile. The polyp was removed with a cold snare. Resection and       retrieval were complete.      Three sessile polyps were found in the rectum. The polyps were 2 to 4 mm       in size. These polyps were removed with a cold biopsy forceps. Resection       and retrieval were complete.      The digital rectal exam was normal.      Multiple small and large-mouthed diverticula were found in the sigmoid       colon, descending colon and transverse colon. Impression:           - Preparation of the colon was fair.                       - One 2 mm polyp in the proximal descending colon,                        removed with a cold biopsy forceps. Resected and                        retrieved.                       - One 5 mm polyp in the ascending colon, removed with a                        cold snare. Resected and retrieved.                       - One 12 mm polyp in the cecum, removed with a cold                        snare, removed using lift and cut and a cold snare,                        removed piecemeal using a cold snare and removed with a                        cold biopsy forceps. Resected and retrieved.                       - One 3 mm polyp in the proximal sigmoid colon, removed                        with a cold snare. Resected and retrieved.                       - Three 2 to 4 mm polyps in the rectum, removed with a  cold biopsy forceps. Resected and retrieved.                       - Diverticulosis in the sigmoid colon, in the                        descending colon and in the transverse colon. Recommendation:       -  Discharge patient to home.                       - Telephone GI clinic for pathology results in 1 week. Procedure Code(s):    --- Professional ---                       [phone], Colonoscopy, flexible; with removal of tumor(s),                        polyp(s), or other lesion(s) by snare technique                       [id number], 30, Colonoscopy, flexible; with biopsy, single                        or multiple Diagnosis Code(s):    --- Professional ---                       D12.4, Benign neoplasm of descending colon                       D12.2, Benign neoplasm of ascending colon                       D12.0, Benign neoplasm of cecum                       D12.5, Benign neoplasm of sigmoid colon                       K62.1, Rectal polyp                       Z86.010, Personal history of colonic polyps                       K57.30, Diverticulosis of large intestine without                        perforation or abscess without bleeding CPT copyright 2016 American Medical Association. All rights reserved. The codes documented in this report are preliminary and upon coder review may  be revised to meet current compliance requirements. Lollie Sails, MD 05/30/2016 12:01:29 PM This report has been signed electronically. Number of Addenda: 0 Note Initiated On: 05/30/2016 10:36 AM Scope Withdrawal Time: 0 hours 35 minutes 14 seconds  Total Procedure Duration: 0 hours 58 minutes 22 seconds       [facility]

## 2016-05-30 NOTE — Anesthesia Postprocedure Evaluation (Signed)
Anesthesia Post Note  Patient: [name]  Procedure(s) Performed: Procedure(s) (LRB): COLONOSCOPY WITH PROPOFOL (N/A)  Patient location during evaluation: Endoscopy Anesthesia Type: General Level of consciousness: awake and alert Pain management: pain level controlled Vital Signs Assessment: post-procedure vital signs reviewed and stable Respiratory status: spontaneous breathing, nonlabored ventilation, respiratory function stable and patient connected to nasal cannula oxygen Cardiovascular status: blood pressure returned to baseline and stable Postop Assessment: no signs of nausea or vomiting Anesthetic complications: no     Last Vitals:  Vitals:   05/30/16 1222 05/30/16 1232  BP: (!) 128/56 (!) 144/68  Pulse: 73 76  Resp: 16 16  Temp:      Last Pain:  Vitals:   05/30/16 1202  TempSrc: Tympanic                 Martha Clan

## 2016-05-30 NOTE — Transfer of Care (Signed)
Immediate Anesthesia Transfer of Care Note  Patient: Cheryl Bird  Procedure(s) Performed: Procedure(s): COLONOSCOPY WITH PROPOFOL (N/A)  Patient Location: PACU  Anesthesia Type:General  Level of Consciousness: sedated  Airway & Oxygen Therapy: Patient Spontanous Breathing and Patient connected to nasal cannula oxygen  Post-op Assessment: Report given to RN and Post -op Vital signs reviewed and stable  Post vital signs: Reviewed and stable  Last Vitals:  Vitals:   05/30/16 1006 05/30/16 1202  BP: (!) 156/83 (!) (P) 179/67  Pulse: 90 (P) 75  Resp: 18 (!) (P) 83  Temp: 36.5 C (!) (P) 36.1 C    Last Pain:  Vitals:   05/30/16 1202  TempSrc: (P) Tympanic         Complications: No apparent anesthesia complications

## 2016-05-30 NOTE — Anesthesia Preprocedure Evaluation (Signed)
Anesthesia Evaluation  Patient identified by MRN, date of birth, ID band Patient awake    Reviewed: Allergy & Precautions, H&P , NPO status , Patient's Chart, lab work & pertinent test results, reviewed documented beta blocker date and time   History of Anesthesia Complications Negative for: history of anesthetic complications  Airway Mallampati: II  TM Distance: >3 FB Neck ROM: full    Dental  (+) Edentulous Upper, Missing, Poor Dentition, Dental Advidsory Given   Pulmonary neg shortness of breath, COPD,  COPD inhaler, neg recent URI, Current Smoker,           Cardiovascular Exercise Tolerance: Good hypertension, (-) angina(-) CAD, (-) Past MI, (-) Cardiac Stents and (-) CABG (-) dysrhythmias (-) Valvular Problems/Murmurs     Neuro/Psych negative neurological ROS  negative psych ROS   GI/Hepatic Neg liver ROS, GERD  ,  Endo/Other  diabetes  Renal/GU negative Renal ROS  negative genitourinary   Musculoskeletal   Abdominal   Peds  Hematology negative hematology ROS (+)   Anesthesia Other Findings Past Medical History: 09/2004: Adenomatous rectal polyp     Comment: Nicolasa Ducking) 01/2016: Background diabetic retinopathy ([place]) 02/2012: Barrett esophagus     Comment: on EGD biopsy Gustavo Lah) 02/12/2013: COPD, moderate ([place])     Comment: Spirometry - 01/2013: FEV1 = 59%, post alb               78%, ratio 0.6, mod severe obstruction, good               response to bronchodilator  No date: Diabetes type 2, controlled ([place])     Comment: DMSE @ [facility] 11/2011 09/2004: Diverticulosis     Comment: by colonoscopy 01/2013: Ex-smoker No date: GERD (gastroesophageal reflux disease) No date: History of cleft palate No date: HLD (hyperlipidemia) No date: HTN (hypertension)     Comment: dx per prior records 05/2015: Osteopenia     Comment: T -1.4 femur, foreamr -0.7 No date: Right-sided sensorineural hearing loss     Comment: from  infection as child, hsa been seen by Dr.               Pryor Ochoa   Reproductive/Obstetrics negative OB ROS                             Anesthesia Physical Anesthesia Plan  ASA: III  Anesthesia Plan: General   Post-op Pain Management:    Induction:   Airway Management Planned:   Additional Equipment:   Intra-op Plan:   Post-operative Plan:   Informed Consent: I have reviewed the patients History and Physical, chart, labs and discussed the procedure including the risks, benefits and alternatives for the proposed anesthesia with the patient or authorized representative who has indicated his/her understanding and acceptance.   Dental Advisory Given  Plan Discussed with: Anesthesiologist, CRNA and Surgeon  Anesthesia Plan Comments:         Anesthesia Quick Evaluation

## 2016-05-30 NOTE — Telephone Encounter (Signed)
Pt returned your call. Please leave detailed message if no answer  Thanks

## 2016-05-30 NOTE — Anesthesia Post-op Follow-up Note (Cosign Needed)
Anesthesia QCDR form completed.        

## 2016-05-30 NOTE — H&P (Signed)
Outpatient short stay form Pre-procedure 05/30/2016 10:36 AM Lollie Sails MD  Primary Physician: Dr. Ria Bush  Reason for visit:  Colonoscopy  History of present illness:  Patient is a 71 year old female presenting today as above. She has a personal history of adenomatous colon polyps. He tolerated her prep well. She takes no aspirin or blood thinning agents.     Current Facility-Administered Medications:  .  0.9 %  sodium chloride infusion, , Intravenous, Continuous, Lollie Sails, MD, Last Rate: 20 mL/hr at 05/30/16 1021 .  0.9 %  sodium chloride infusion, , Intravenous, Continuous, Lollie Sails, MD  Prescriptions Prior to Admission  Medication Sig Dispense Refill Last Dose  . ACCU-CHEK SOFTCLIX LANCETS lancets Use to check sugar once daily and as needed Dx: E11.9 100 each 3 Taking  . albuterol (PROVENTIL HFA;VENTOLIN HFA) 108 (90 BASE) MCG/ACT inhaler Inhale 2 puffs into the lungs every 6 (six) hours as needed for wheezing. 1 Inhaler 3 Taking  . Alcohol Swabs PADS Use to clean finger prior to testing blood sugar. Dx: E11.9 100 each 3 Taking  . atorvastatin (LIPITOR) 20 MG tablet TAKE 1 TABLET (20 MG TOTAL) BY MOUTH NIGHTLY 90 tablet 3 05/28/2016  . Calcium Carbonate-Vitamin D (CALCIUM 600/VITAMIN D) 600-400 MG-UNIT chew tablet Chew 1 tablet by mouth daily.   05/28/2016  . glucose blood (ACCU-CHEK SMARTVIEW) test strip 1 each by Other route as directed. Use as instructed to check blood sugar once daily and as needed. Dx: E11.9 **NANO SMARTVIEW** 100 each 3 Taking  . JANUVIA 50 MG tablet TAKE 1 TABLET (50 MG TOTAL) BY MOUTH DAILY. 90 tablet 3 05/28/2016  . lisinopril (PRINIVIL,ZESTRIL) 20 MG tablet TAKE 1 TABLET (20 MG TOTAL) BY MOUTH DAILY. 90 tablet 3 05/28/2016  . metFORMIN (GLUCOPHAGE) 1000 MG tablet TAKE 1 TABLET BY MOUTH TWICE A DAY WITH A MEAL 180 tablet 3 05/28/2016  . oxybutynin (DITROPAN) 5 MG tablet TAKE 1 TABLET (5 MG TOTAL) BY MOUTH 2 (TWO) TIMES DAILY AS NEEDED  (INCONTINENCE). 60 tablet 6 Taking  . pantoprazole (PROTONIX) 40 MG tablet Take 40 mg by mouth daily.   05/28/2016  . sulfamethoxazole-trimethoprim (BACTRIM DS,SEPTRA DS) 800-160 MG tablet Take 1 tablet by mouth 2 (two) times daily. 10 tablet 0      Allergies  Allergen Reactions  . Aleve [Naproxen Sodium] Itching     Past Medical History:  Diagnosis Date  . Adenomatous rectal polyp 09/2004   Nicolasa Ducking)  . Background diabetic retinopathy ([place]) 01/2016  . Barrett esophagus 02/2012   on EGD biopsy Gustavo Lah)  . COPD, moderate ([place]) 02/12/2013   Spirometry - 01/2013: FEV1 = 59%, post alb 78%, ratio 0.6, mod severe obstruction, good response to bronchodilator   . Diabetes type 2, controlled ([place])    DMSE @ [facility] 11/2011  . Diverticulosis 09/2004   by colonoscopy  . Ex-smoker 01/2013  . GERD (gastroesophageal reflux disease)   . History of cleft palate   . HLD (hyperlipidemia)   . HTN (hypertension)    dx per prior records  . Osteopenia 05/2015   T -1.4 femur, foreamr -0.7  . Right-sided sensorineural hearing loss    from infection as child, hsa been seen by Dr. Pryor Ochoa    Review of systems:      Physical Exam    Heart and lungs: Regular rate and rhythm without rub or gallop, lungs are bilaterally clear.    HEENT: Normocephalic atraumatic eyes are anicteric    Other:  Pertinant exam for procedure: Soft nontender nondistended bowel sounds positive normoactive.    Planned proceedures: Colonoscopy and indicated procedures. I have discussed the risks benefits and complications of procedures to include not limited to bleeding, infection, perforation and the risk of sedation and the patient wishes to proceed.    Lollie Sails, MD Gastroenterology 05/30/2016  10:36 AM

## 2016-05-31 ENCOUNTER — Encounter: Payer: Self-pay | Admitting: Gastroenterology

## 2016-05-31 LAB — SURGICAL PATHOLOGY

## 2016-05-31 NOTE — Telephone Encounter (Signed)
I didn't call patient. However, a message was left for her about a UCx as requested with details by another CMA.

## 2016-06-03 ENCOUNTER — Encounter: Payer: Self-pay | Admitting: Family Medicine

## 2016-06-03 ENCOUNTER — Ambulatory Visit (INDEPENDENT_AMBULATORY_CARE_PROVIDER_SITE_OTHER): Payer: Medicare Other | Admitting: Family Medicine

## 2016-06-03 VITALS — BP 134/70 | HR 84 | Temp 97.7°F | Wt 142.8 lb

## 2016-06-03 DIAGNOSIS — E113299 Type 2 diabetes mellitus with mild nonproliferative diabetic retinopathy without macular edema, unspecified eye: Secondary | ICD-10-CM | POA: Diagnosis not present

## 2016-06-03 DIAGNOSIS — R0989 Other specified symptoms and signs involving the circulatory and respiratory systems: Secondary | ICD-10-CM | POA: Diagnosis not present

## 2016-06-03 DIAGNOSIS — R7989 Other specified abnormal findings of blood chemistry: Secondary | ICD-10-CM

## 2016-06-03 DIAGNOSIS — E785 Hyperlipidemia, unspecified: Secondary | ICD-10-CM | POA: Diagnosis not present

## 2016-06-03 DIAGNOSIS — M8580 Other specified disorders of bone density and structure, unspecified site: Secondary | ICD-10-CM

## 2016-06-03 DIAGNOSIS — I10 Essential (primary) hypertension: Secondary | ICD-10-CM | POA: Diagnosis not present

## 2016-06-03 DIAGNOSIS — N3000 Acute cystitis without hematuria: Secondary | ICD-10-CM

## 2016-06-03 DIAGNOSIS — Z0001 Encounter for general adult medical examination with abnormal findings: Secondary | ICD-10-CM | POA: Diagnosis not present

## 2016-06-03 DIAGNOSIS — J449 Chronic obstructive pulmonary disease, unspecified: Secondary | ICD-10-CM

## 2016-06-03 DIAGNOSIS — Z7189 Other specified counseling: Secondary | ICD-10-CM | POA: Diagnosis not present

## 2016-06-03 DIAGNOSIS — K2270 Barrett's esophagus without dysplasia: Secondary | ICD-10-CM | POA: Diagnosis not present

## 2016-06-03 DIAGNOSIS — E119 Type 2 diabetes mellitus without complications: Secondary | ICD-10-CM | POA: Diagnosis not present

## 2016-06-03 DIAGNOSIS — N3946 Mixed incontinence: Secondary | ICD-10-CM

## 2016-06-03 DIAGNOSIS — Z0000 Encounter for general adult medical examination without abnormal findings: Secondary | ICD-10-CM

## 2016-06-03 DIAGNOSIS — F17200 Nicotine dependence, unspecified, uncomplicated: Secondary | ICD-10-CM

## 2016-06-03 DIAGNOSIS — R946 Abnormal results of thyroid function studies: Secondary | ICD-10-CM | POA: Diagnosis not present

## 2016-06-03 MED ORDER — ATORVASTATIN CALCIUM 20 MG PO TABS: ORAL_TABLET | ORAL | 3 refills | Status: DC

## 2016-06-03 MED ORDER — METFORMIN HCL 1000 MG PO TABS: ORAL_TABLET | ORAL | 3 refills | Status: DC

## 2016-06-03 MED ORDER — SITAGLIPTIN PHOSPHATE 50 MG PO TABS: ORAL_TABLET | ORAL | 3 refills | Status: DC

## 2016-06-03 MED ORDER — VITAMIN D3 25 MCG (1000 UT) PO CAPS: 1.0000 | ORAL_CAPSULE | Freq: Every day | ORAL | Status: DC

## 2016-06-03 MED ORDER — LISINOPRIL 20 MG PO TABS: ORAL_TABLET | ORAL | 3 refills | Status: DC

## 2016-06-03 NOTE — Progress Notes (Signed)
Pre visit review using our clinic review tool, if applicable. No additional management support is needed unless otherwise documented below in the visit note. 

## 2016-06-03 NOTE — Assessment & Plan Note (Signed)
Preventative protocols reviewed and updated unless pt declined. Discussed healthy diet and lifestyle.  

## 2016-06-03 NOTE — Progress Notes (Signed)
BP 134/70   Pulse 84   Temp 97.7 F (36.5 C) (Oral)   Wt 142 lb 12 oz (64.8 kg)   BMI 27.19 kg/m    CC: CPE  Subjective:    Patient ID: Cheryl Bird, female    DOB: 1946-03-09, 71 y.o.   MRN: [id number]  HPI: Cheryl Bird is a 71 y.o. female presenting on 06/03/2016 for Annual Exam   Saw Katha Cabal last week for medicare wellness visit. Note reviewed. Endorsed increased urinary frequency and nocturnal enuresis, urinalysis abnormal, UCx grew >100k E coli sensitive to bactrim which she was treated with. She has been taking medication and symptoms are improved, but ongoing stress incontinence despite h/o bladder tacking.  Preventative: COLONOSCOPY WITH PROPOFOL 05/30/2016; TAx3 Lollie Sails, MD) - reviewed results with patient. ESOPHAGOGASTRODUODENOSCOPY 11/2015; chronic gastritis, reflux esophagitis, + barrett's, + H pylori, rpt 2 yrs Gustavo Lah) Mammo - 05/2015 WNL - rpt scheduled Lung cancer screening - started smoking age 86, smoked a few cigarettes to 1.5 packs per day. Eligible for screening - wll consider CT and let me know if decides to undergo Last pap smear - 11/2011 WNL decided to aged out. Agrees to pelvic exam today.  DEXA 05/2015 osteopenia - T -1.4 femur, forearm -0.7. Walks daily. Calcium in diet.  Flu shot yearly Td 11/2011 Pneumovax 8/32013, prevnar 12/2013 Shingles shot - declines Advanced directives: full code. Would want HCPOA to be daughter. Son in Sports coach is Therapist, sports of finances. Wants trial of life support if deemed reversible. Otherwise doesn't want prolonged life support. Has spoken with daughter/son in law about this. Has advanced directive packet at home.  Seat belt use discussed.  Sunscreen use discussed. No changing moles on skin.  Current smoker about 1 pack per week Alcohol - none   Caffeine: 2 cups coffee/day  [place] up in [place] Occupation: retired  Edu: HS  Activity: walks daily 86min  Diet: good water, fruits/vegetables daily   Relevant past  medical, surgical, family and social history reviewed and updated as indicated. Interim medical history since our last visit reviewed. Allergies and medications reviewed and updated. Current Outpatient Prescriptions on File Prior to Visit  Medication Sig  . ACCU-CHEK SOFTCLIX LANCETS lancets Use to check sugar once daily and as needed Dx: E11.9  . albuterol (PROVENTIL HFA;VENTOLIN HFA) 108 (90 BASE) MCG/ACT inhaler Inhale 2 puffs into the lungs every 6 (six) hours as needed for wheezing.  . Alcohol Swabs PADS Use to clean finger prior to testing blood sugar. Dx: E11.9  . Calcium Carbonate-Vitamin D (CALCIUM 600/VITAMIN D) 600-400 MG-UNIT chew tablet Chew 1 tablet by mouth daily.  Marland Kitchen glucose blood (ACCU-CHEK SMARTVIEW) test strip 1 each by Other route as directed. Use as instructed to check blood sugar once daily and as needed. Dx: E11.9 **NANO SMARTVIEW**  . oxybutynin (DITROPAN) 5 MG tablet TAKE 1 TABLET (5 MG TOTAL) BY MOUTH 2 (TWO) TIMES DAILY AS NEEDED (INCONTINENCE).  Marland Kitchen pantoprazole (PROTONIX) 40 MG tablet Take 40 mg by mouth daily.  Marland Kitchen sulfamethoxazole-trimethoprim (BACTRIM DS,SEPTRA DS) 800-160 MG tablet Take 1 tablet by mouth 2 (two) times daily.   No current facility-administered medications on file prior to visit.     Review of Systems  Constitutional: Negative for activity change, appetite change, chills, fatigue, fever and unexpected weight change.  HENT: Negative for hearing loss.   Eyes: Negative for visual disturbance.  Respiratory: Negative for cough, chest tightness, shortness of breath and wheezing.   Cardiovascular: Negative for chest pain,  palpitations and leg swelling.  Gastrointestinal: Negative for abdominal distention, abdominal pain, blood in stool, constipation, diarrhea, nausea and vomiting.  Genitourinary: Negative for difficulty urinating and hematuria.  Musculoskeletal: Negative for arthralgias, myalgias and neck pain.  Skin: Negative for rash.  Neurological:  Positive for headaches. Negative for dizziness, seizures and syncope.  Hematological: Negative for adenopathy. Does not bruise/bleed easily.  Psychiatric/Behavioral: Negative for dysphoric mood. The patient is not nervous/anxious.    Per HPI unless specifically indicated in ROS section     Objective:    BP 134/70   Pulse 84   Temp 97.7 F (36.5 C) (Oral)   Wt 142 lb 12 oz (64.8 kg)   BMI 27.19 kg/m   Wt Readings from Last 3 Encounters:  06/03/16 142 lb 12 oz (64.8 kg)  05/30/16 141 lb (64 kg)  05/27/16 141 lb 8 oz (64.2 kg)    Physical Exam  Constitutional: She is oriented to person, place, and time. She appears well-developed and well-nourished. No distress.  HENT:  Head: Normocephalic and atraumatic.  Right Ear: Hearing, external ear and ear canal normal.  Left Ear: Hearing, external ear and ear canal normal.  Nose: Nose normal.  Mouth/Throat: Uvula is midline, oropharynx is clear and moist and mucous membranes are normal. No oropharyngeal exudate, posterior oropharyngeal edema or posterior oropharyngeal erythema.  Chronic scarring of TMs ?chronic perforations  Eyes: Conjunctivae and EOM are normal. Pupils are equal, round, and reactive to light. No scleral icterus.  Neck: Normal range of motion. Neck supple. Carotid bruit is present (bilateral). No thyromegaly present.  Cardiovascular: Normal rate, regular rhythm and intact distal pulses.   Murmur (2/6 SEM) heard. Pulses:      Radial pulses are 2+ on the right side, and 2+ on the left side.  Pulmonary/Chest: Effort normal and breath sounds normal. No respiratory distress. She has no wheezes. She has no rales.  Abdominal: Soft. Bowel sounds are normal. She exhibits no distension and no mass. There is no tenderness. There is no rebound and no guarding.  Genitourinary: Vagina normal and uterus normal. Pelvic exam was performed with patient supine. There is no rash, tenderness or lesion on the right labia. There is no rash,  tenderness or lesion on the left labia. Right adnexum displays no mass and no tenderness. Left adnexum displays no mass and no tenderness.  Genitourinary Comments: Bimanual, no pap Stitches present superior vaginal vault Some leaking of urine with exam  Musculoskeletal: Normal range of motion. She exhibits no edema.  Lymphadenopathy:    She has no cervical adenopathy.  Neurological: She is alert and oriented to person, place, and time.  CN grossly intact, station and gait intact  Skin: Skin is warm and dry. No rash noted.  Psychiatric: She has a normal mood and affect. Her behavior is normal. Judgment and thought content normal.  Nursing note and vitals reviewed.  Lab Results  Component Value Date   TSH 3.06 05/27/2016   Lab Results  Component Value Date   HGBA1C 7.0 (H) 05/27/2016    Lab Results  Component Value Date   CHOL 149 05/27/2016   HDL 59.40 05/27/2016   LDLCALC 71 05/27/2016   LDLDIRECT 154.1 07/06/2012   TRIG 93.0 05/27/2016   CHOLHDL 3 05/27/2016       Assessment & Plan:   Problem List Items Addressed This Visit    Abnormal TSH    Thyroid levels stable on recent check.       Advanced care planning/counseling discussion  Advanced directives: full code. Would want HCPOA to be daughter. Son in Sports coach is Therapist, sports of finances. Wants trial of life support if deemed reversible. Otherwise doesn't want prolonged life support. Has spoken with daughter/son in law about this. Has advanced directive packet at home.       Background diabetic retinopathy ([place])   Relevant Medications   atorvastatin (LIPITOR) 20 MG tablet   sitaGLIPtin (JANUVIA) 50 MG tablet   lisinopril (PRINIVIL,ZESTRIL) 20 MG tablet   metFORMIN (GLUCOPHAGE) 1000 MG tablet   Barrett esophagus    Continues PPI daily. EGD Q34yrs Gustavo Lah)      Bilateral carotid bruits    Ordered carotid US.       Relevant Orders   VAS US CAROTID   COPD, moderate ([place])    Continued smoker. Takes rare PRN  albuterol, denies significant dyspnea or cough.       Diabetes type 2, controlled ([facility])    Chronic, stable. Continue current regimen. RTC 6 mo DM f/u.       Relevant Medications   atorvastatin (LIPITOR) 20 MG tablet   sitaGLIPtin (JANUVIA) 50 MG tablet   lisinopril (PRINIVIL,ZESTRIL) 20 MG tablet   metFORMIN (GLUCOPHAGE) 1000 MG tablet   Health maintenance examination - Primary    Preventative protocols reviewed and updated unless pt declined. Discussed healthy diet and lifestyle.       HLD (hyperlipidemia)    Chronic, stable. Continue lipitor.       Relevant Medications   atorvastatin (LIPITOR) 20 MG tablet   lisinopril (PRINIVIL,ZESTRIL) 20 MG tablet   HTN (hypertension)    Chronic, stable. Continue lisinopril 20mg  daily.      Relevant Medications   atorvastatin (LIPITOR) 20 MG tablet   lisinopril (PRINIVIL,ZESTRIL) 20 MG tablet   Osteopenia    Reviewed recent DEXA, discussed calcium in diet, vit D, regular weight bearing exercise. Continue to encourage smoking cessation.      Smoker    Continue to encourage cessation.  Action phase, interested in nicorette CQ Discussed park and chew method. Update if further assistance desired.  Discussed lung cancer screening CT - pt declines for now, will consider and if changes mind will let us know for referral.       Urinary incontinence, mixed    Stress + urge, currently undergoing treatment for UTI with bactrim course.  Physical exam with evidence of stress leakage, but no obvious cystocele/prolapse. S/p bladder tacking surgery.  Started antimuscarinic oxybutynin IR PRN which pt states is somewhat effective.       UTI (urinary tract infection)    UCx grew >100k E coli sensitive to bactrim - rec finish treatment.           Follow up plan: Return in about 6 months (around 12/01/2016) for follow up visit.  Ria Bush, MD

## 2016-06-03 NOTE — Patient Instructions (Addendum)
We will set you up for carotid ultrasound of neck arteries.  Work on Ec[employer].  Ok to try nicorette gum - use park and chew method.  Continue walking regularly to keep bones strong. Get good calcium in diet (milk, yogurt, and leafy greens).  Start vitamin D 1000 units daily Think about lung cancer screening CT scan and let me know if you'd like [place] to refer you.  Return as needed or in 6 months for follow up visit.  Health Maintenance, Female Introduction Adopting a healthy lifestyle and getting preventive care can go a long way to promote health and wellness. Talk with your health care provider about what schedule of regular examinations is right for you. This is a good chance for you to check in with your provider about disease prevention and staying healthy. In between checkups, there are plenty of things you can do on your own. Experts have done a lot of research about which lifestyle changes and preventive measures are most likely to keep you healthy. Ask your health care provider for more information. Weight and diet Eat a healthy diet  Be sure to include plenty of vegetables, fruits, low-fat dairy products, and lean protein.  Do not eat a lot of foods high in solid fats, added sugars, or salt.  Get regular exercise. This is one of the most important things you can do for your health.  Most adults should exercise for at least 150 minutes each week. The exercise should increase your heart rate and make you sweat (moderate-intensity exercise).  Most adults should also do strengthening exercises at least twice a week. This is in addition to the moderate-intensity exercise. Maintain a healthy weight  Body mass index (BMI) is a measurement that can be used to identify possible weight problems. It estimates body fat based on height and weight. Your health care provider can help determine your BMI and help you achieve or maintain a healthy weight.  For females 29 years of  age and older:  A BMI below 18.5 is considered underweight.  A BMI of 18.5 to 24.9 is normal.  A BMI of 25 to 29.9 is considered overweight.  A BMI of 30 and above is considered obese. Watch levels of cholesterol and blood lipids  You should start having your blood tested for lipids and cholesterol at 71 years of age, then have this test every 5 years.  You may need to have your cholesterol levels checked more often if:  Your lipid or cholesterol levels are high.  You are older than 71 years of age.  You are at high risk for heart disease. Cancer screening Lung Cancer  Lung cancer screening is recommended for adults 84-78 years old who are at high risk for lung cancer because of a history of smoking.  A yearly low-dose CT scan of the lungs is recommended for people who:  Currently smoke.  Have quit within the past 15 years.  Have at least a 30-pack-year history of smoking. A pack year is smoking an average of one pack of cigarettes a day for 1 year.  Yearly screening should continue until it has been 15 years since you quit.  Yearly screening should stop if you develop a health problem that would prevent you from having lung cancer treatment. Breast Cancer  Practice breast self-awareness. This means understanding how your breasts normally appear and feel.  It also means doing regular breast self-exams. Let your health care provider know about any changes, no  matter how small.  If you are in your 20s or 30s, you should have a clinical breast exam (CBE) by a health care provider every 1-3 years as part of a regular health exam.  If you are 36 or older, have a CBE every year. Also consider having a breast X-ray (mammogram) every year.  If you have a family history of breast cancer, talk to your health care provider about genetic screening.  If you are at high risk for breast cancer, talk to your health care provider about having an MRI and a mammogram every  year.  Breast cancer gene (BRCA) assessment is recommended for women who have family members with BRCA-related cancers. BRCA-related cancers include:  Breast.  Ovarian.  Tubal.  Peritoneal cancers.  Results of the assessment will determine the need for genetic counseling and BRCA1 and BRCA2 testing. Cervical Cancer  Your health care provider may recommend that you be screened regularly for cancer of the pelvic organs (ovaries, uterus, and vagina). This screening involves a pelvic examination, including checking for microscopic changes to the surface of your cervix (Pap test). You may be encouraged to have this screening done every 3 years, beginning at age 36.  For women ages 79-65, health care providers may recommend pelvic exams and Pap testing every 3 years, or they may recommend the Pap and pelvic exam, combined with testing for human papilloma virus (HPV), every 5 years. Some types of HPV increase your risk of cervical cancer. Testing for HPV may also be done on women of any age with unclear Pap test results.  Other health care providers may not recommend any screening for nonpregnant women who are considered low risk for pelvic cancer and who do not have symptoms. Ask your health care provider if a screening pelvic exam is right for you.  If you have had past treatment for cervical cancer or a condition that could lead to cancer, you need Pap tests and screening for cancer for at least 20 years after your treatment. If Pap tests have been discontinued, your risk factors (such as having a new sexual partner) need to be reassessed to determine if screening should resume. Some women have medical problems that increase the chance of getting cervical cancer. In these cases, your health care provider may recommend more frequent screening and Pap tests. Colorectal Cancer  This type of cancer can be detected and often prevented.  Routine colorectal cancer screening usually begins at 71 years  of age and continues through 71 years of age.  Your health care provider may recommend screening at an earlier age if you have risk factors for colon cancer.  Your health care provider may also recommend using home test kits to check for hidden blood in the stool.  A small camera at the end of a tube can be used to examine your colon directly (sigmoidoscopy or colonoscopy). This is done to check for the earliest forms of colorectal cancer.  Routine screening usually begins at age 40.  Direct examination of the colon should be repeated every 5-10 years through 71 years of age. However, you may need to be screened more often if early forms of precancerous polyps or small growths are found. Skin Cancer  Check your skin from head to toe regularly.  Tell your health care provider about any new moles or changes in moles, especially if there is a change in a mole's shape or color.  Also tell your health care provider if you have a mole  that is larger than the size of a pencil eraser.  Always use sunscreen. Apply sunscreen liberally and repeatedly throughout the day.  Protect yourself by wearing long sleeves, pants, a wide-brimmed hat, and sunglasses whenever you are outside. Heart disease, diabetes, and high blood pressure  High blood pressure causes heart disease and increases the risk of stroke. High blood pressure is more likely to develop in:  People who have blood pressure in the high end of the normal range (130-139/85-89 mm Hg).  People who are overweight or obese.  People who are African American.  If you are 83-34 years of age, have your blood pressure checked every 3-5 years. If you are 33 years of age or older, have your blood pressure checked every year. You should have your blood pressure measured twice-once when you are at a hospital or clinic, and once when you are not at a hospital or clinic. Record the average of the two measurements. To check your blood pressure when you  are not at a hospital or clinic, you can use:  An automated blood pressure machine at a pharmacy.  A home blood pressure monitor.  If you are between 49 years and 60 years old, ask your health care provider if you should take aspirin to prevent strokes.  Have regular diabetes screenings. This involves taking a blood sample to check your fasting blood sugar level.  If you are at a normal weight and have a low risk for diabetes, have this test once every three years after 71 years of age.  If you are overweight and have a high risk for diabetes, consider being tested at a younger age or more often. Preventing infection Hepatitis B  If you have a higher risk for hepatitis B, you should be screened for this virus. You are considered at high risk for hepatitis B if:  You were born in a country where hepatitis B is common. Ask your health care provider which countries are considered high risk.  Your parents were born in a high-risk country, and you have not been immunized against hepatitis B (hepatitis B vaccine).  You have HIV or AIDS.  You use needles to inject street drugs.  You live with someone who has hepatitis B.  You have had sex with someone who has hepatitis B.  You get hemodialysis treatment.  You take certain medicines for conditions, including cancer, organ transplantation, and autoimmune conditions. Hepatitis C  Blood testing is recommended for:  Everyone born from [birth year] through 1965.  Anyone with known risk factors for hepatitis C. Sexually transmitted infections (STIs)  You should be screened for sexually transmitted infections (STIs) including gonorrhea and chlamydia if:  You are sexually active and are younger than 71 years of age.  You are older than 71 years of age and your health care provider tells you that you are at risk for this type of infection.  Your sexual activity has changed since you were last screened and you are at an increased risk for  chlamydia or gonorrhea. Ask your health care provider if you are at risk.  If you do not have HIV, but are at risk, it may be recommended that you take a prescription medicine daily to prevent HIV infection. This is called pre-exposure prophylaxis (PrEP). You are considered at risk if:  You are sexually active and do not regularly use condoms or know the HIV status of your partner(s).  You take drugs by injection.  You are sexually active with  a partner who has HIV. Talk with your health care provider about whether you are at high risk of being infected with HIV. If you choose to begin PrEP, you should first be tested for HIV. You should then be tested every 3 months for as long as you are taking PrEP. Pregnancy  If you are premenopausal and you may become pregnant, ask your health care provider about preconception counseling.  If you may become pregnant, take 400 to 800 micrograms (mcg) of folic acid every day.  If you want to prevent pregnancy, talk to your health care provider about birth control (contraception). Osteoporosis and menopause  Osteoporosis is a disease in which the bones lose minerals and strength with aging. This can result in serious bone fractures. Your risk for osteoporosis can be identified using a bone density scan.  If you are 78 years of age or older, or if you are at risk for osteoporosis and fractures, ask your health care provider if you should be screened.  Ask your health care provider whether you should take a calcium or vitamin D supplement to lower your risk for osteoporosis.  Menopause may have certain physical symptoms and risks.  Hormone replacement therapy may reduce some of these symptoms and risks. Talk to your health care provider about whether hormone replacement therapy is right for you. Follow these instructions at home:  Schedule regular health, dental, and eye exams.  Stay current with your immunizations.  Do not use any tobacco products  including cigarettes, chewing tobacco, or electronic cigarettes.  If you are pregnant, do not drink alcohol.  If you are breastfeeding, limit how much and how often you drink alcohol.  Limit alcohol intake to no more than 1 drink per day for nonpregnant women. One drink equals 12 ounces of beer, 5 ounces of wine, or 1 ounces of hard liquor.  Do not use street drugs.  Do not share needles.  Ask your health care provider for help if you need support or information about quitting drugs.  Tell your health care provider if you often feel depressed.  Tell your health care provider if you have ever been abused or do not feel safe at home. This information is not intended to replace advice given to you by your health care provider. Make sure you discuss any questions you have with your health care provider. Document Released: 10/25/2010 Document Revised: 09/17/2015 Document Reviewed: 01/13/2015  2017 [employer]vier

## 2016-06-03 NOTE — Assessment & Plan Note (Signed)
Advanced directives: full code. Would want HCPOA to be daughter. Son in law is executor of finances. Wants trial of life support if deemed reversible. Otherwise doesn't want prolonged life support. Has spoken with daughter/son in law about this. Has advanced directive packet at home.  

## 2016-06-04 DIAGNOSIS — I6529 Occlusion and stenosis of unspecified carotid artery: Secondary | ICD-10-CM | POA: Insufficient documentation

## 2016-06-04 HISTORY — DX: Occlusion and stenosis of unspecified carotid artery: I65.29

## 2016-06-04 NOTE — Assessment & Plan Note (Signed)
UCx grew >100k E coli sensitive to bactrim - rec finish treatment.

## 2016-06-04 NOTE — Assessment & Plan Note (Signed)
Ordered carotid US 

## 2016-06-04 NOTE — Assessment & Plan Note (Signed)
Chronic, stable. Continue lisinopril 20mg daily.  

## 2016-06-04 NOTE — Assessment & Plan Note (Signed)
Chronic, stable. Continue current regimen. RTC 6 mo DM f/u.  

## 2016-06-04 NOTE — Assessment & Plan Note (Signed)
Thyroid levels stable on recent check.

## 2016-06-04 NOTE — Assessment & Plan Note (Signed)
Continue to encourage cessation.  Action phase, interested in nicorette CQ Discussed park and chew method. Update if further assistance desired.  Discussed lung cancer screening CT - pt declines for now, will consider and if changes mind will let us know for referral.

## 2016-06-04 NOTE — Assessment & Plan Note (Signed)
Reviewed recent DEXA, discussed calcium in diet, vit D, regular weight bearing exercise. Continue to encourage smoking cessation.

## 2016-06-04 NOTE — Assessment & Plan Note (Signed)
Continued smoker. Takes rare PRN albuterol, denies significant dyspnea or cough.

## 2016-06-04 NOTE — Assessment & Plan Note (Signed)
Chronic, stable. Continue lipitor.  

## 2016-06-04 NOTE — Assessment & Plan Note (Addendum)
Continues PPI daily. EGD Q20yrs Gustavo Lah)

## 2016-06-04 NOTE — Assessment & Plan Note (Signed)
Stress + urge, currently undergoing treatment for UTI with bactrim course.  Physical exam with evidence of stress leakage, but no obvious cystocele/prolapse. S/p bladder tacking surgery.  Started antimuscarinic oxybutynin IR PRN which pt states is somewhat effective.

## 2016-06-15 ENCOUNTER — Ambulatory Visit
Admission: RE | Admit: 2016-06-15 | Discharge: 2016-06-15 | Disposition: A | Discharge status: Home / Self Care | Payer: Medicare Other | Source: Ambulatory Visit | Attending: Family Medicine | Admitting: Family Medicine

## 2016-06-15 DIAGNOSIS — N6489 Other specified disorders of breast: Secondary | ICD-10-CM | POA: Insufficient documentation

## 2016-06-15 DIAGNOSIS — Z1231 Encounter for screening mammogram for malignant neoplasm of breast: Secondary | ICD-10-CM | POA: Diagnosis not present

## 2016-06-15 LAB — HM MAMMOGRAPHY

## 2016-06-16 ENCOUNTER — Encounter: Payer: Self-pay | Admitting: *Deleted

## 2016-06-18 ENCOUNTER — Encounter: Payer: Self-pay | Admitting: Family Medicine

## 2016-06-21 ENCOUNTER — Other Ambulatory Visit: Payer: Self-pay | Admitting: Family Medicine

## 2016-06-29 ENCOUNTER — Ambulatory Visit: Payer: Medicare Other

## 2016-06-29 DIAGNOSIS — R0989 Other specified symptoms and signs involving the circulatory and respiratory systems: Secondary | ICD-10-CM | POA: Diagnosis not present

## 2016-06-29 LAB — VAS US CAROTID
LCCADDIAS: 31 cm/s
LCCAPDIAS: 29 cm/s
LEFT ECA DIAS: 49 cm/s
LEFT VERTEBRAL DIAS: 20 cm/s
LICADDIAS: -51 cm/s
LICADSYS: -144 cm/s
LICAPDIAS: -23 cm/s
LICAPSYS: -115 cm/s
Left CCA dist sys: 109 cm/s
Left CCA prox sys: 102 cm/s
RIGHT ECA DIAS: 36 cm/s
RIGHT VERTEBRAL DIAS: 17 cm/s
Right CCA prox dias: 20 cm/s
Right CCA prox sys: 104 cm/s
Right cca dist sys: -142 cm/s

## 2016-07-01 ENCOUNTER — Encounter: Payer: Self-pay | Admitting: Family Medicine

## 2016-07-04 ENCOUNTER — Encounter: Payer: Self-pay | Admitting: *Deleted

## 2016-08-29 ENCOUNTER — Telehealth: Payer: Self-pay | Admitting: *Deleted

## 2016-08-29 NOTE — Telephone Encounter (Signed)
Received fax from [facility] requesting order for diabetic testing supplies.  Called and spoke with Cheryl Bird who states she uses [facility] for her supplies. Request denied and faxed back to [facility] at [phone].

## 2016-08-31 NOTE — Telephone Encounter (Signed)
Received another request from [facility] - denied again. Asked to stop sending [place] requests.

## 2016-11-25 ENCOUNTER — Encounter: Payer: Self-pay | Admitting: Family Medicine

## 2016-11-25 ENCOUNTER — Ambulatory Visit (INDEPENDENT_AMBULATORY_CARE_PROVIDER_SITE_OTHER): Payer: Medicare Other | Admitting: Family Medicine

## 2016-11-25 VITALS — BP 140/82 | HR 88 | Ht 60.75 in | Wt 140.0 lb

## 2016-11-25 DIAGNOSIS — E113299 Type 2 diabetes mellitus with mild nonproliferative diabetic retinopathy without macular edema, unspecified eye: Secondary | ICD-10-CM

## 2016-11-25 DIAGNOSIS — E785 Hyperlipidemia, unspecified: Secondary | ICD-10-CM

## 2016-11-25 DIAGNOSIS — I10 Essential (primary) hypertension: Secondary | ICD-10-CM

## 2016-11-25 DIAGNOSIS — F17200 Nicotine dependence, unspecified, uncomplicated: Secondary | ICD-10-CM

## 2016-11-25 DIAGNOSIS — E119 Type 2 diabetes mellitus without complications: Secondary | ICD-10-CM

## 2016-11-25 LAB — BASIC METABOLIC PANEL
BUN: 19 mg/dL (ref 6–23)
CALCIUM: 9.6 mg/dL (ref 8.4–10.5)
CHLORIDE: 103 meq/L (ref 96–112)
CO2: 30 meq/L (ref 19–32)
CREATININE: 0.84 mg/dL (ref 0.40–1.20)
GFR: 70.99 mL/min (ref >60.00)
GLUCOSE: 120 mg/dL — AB (ref 70–99)
POTASSIUM: 4.2 meq/L (ref 3.5–5.1)
SODIUM: 140 meq/L (ref 135–145)

## 2016-11-25 LAB — HEMOGLOBIN A1C: Hgb A1c MFr Bld: 7.4 % — ABNORMAL HIGH (ref 4.6–6.5)

## 2016-11-25 LAB — LDL CHOLESTEROL, DIRECT: LDL DIRECT: 71 mg/dL

## 2016-11-25 NOTE — Assessment & Plan Note (Signed)
Chronic, stable. Continue current regimen. 

## 2016-11-25 NOTE — Assessment & Plan Note (Signed)
Chronic, stable. Continue current regimen. Update A1c. Foot exam today.

## 2016-11-25 NOTE — Patient Instructions (Addendum)
You are doing well today - continue current medicines.  We will set you up with lung cancer screening nurse to discuss lung cancer screening CT in [place] (request beginning of the month). Return in 6 months for medicare wellness visit and physical.

## 2016-11-25 NOTE — Assessment & Plan Note (Signed)
Update dLDL The 10-year ASCVD risk score Mikey Bussing DC Jr., et al., 2013) is: 40.2%   Values used to calculate the score:     Age: 71 years     Sex: Female     Is Non-Hispanic African American: No     Diabetic: Yes     Tobacco smoker: Yes     Systolic Blood Pressure: 390 mmHg     Is BP treated: Yes     HDL Cholesterol: 59.4 mg/dL     Total Cholesterol: 149 mg/dL

## 2016-11-25 NOTE — Assessment & Plan Note (Addendum)
Action stage of cessation. Discussed correct nicorette gum use - anticipate overuse led to nausea.  Agrees to undergo referral to lung cancer screening nurse to discuss lung cancer screening CT in [place] (requests done at beginning of the month).

## 2016-11-25 NOTE — Progress Notes (Signed)
BP 140/82 (BP Location: Left Arm, Patient Position: Sitting, Cuff Size: Normal)   Pulse 88   Ht 5' 0.75" (1.543 m)   Wt 140 lb (63.5 kg)   SpO2 96%   BMI 26.67 kg/m    CC: f/u visit Subjective:    Patient ID: [name], female    DOB: 04-24-1946, 71 y.o.   MRN: [id number]  HPI: DYANN GOODSPEED is a 71 y.o. female presenting on 11/25/2016 for Follow-up (Pt states she is doing well and has no concerns as of today )   Current smoker - prior 1 ppw. Started nicotine gum last week - was using 1 piece every 2 hours felt nauseated. Eligible for lung cancer screening - discussed, pt now ready to undergo screening. Known mod COPD.   DM - regularly does check sugars: 124 yesterday. Compliant with antihyperglycemic regimen which includes: [place] 50mg  daily, metformin 1000mg  bid. Denies low sugars or hypoglycemic symptoms.  Denies paresthesias. Last diabetic eye exam 01/2016.  Pneumovax: 2013.  Prevnar: 2015. Lab Results  Component Value Date   HGBA1C 7.0 (H) 05/27/2016   Diabetic Foot Exam - Simple   Simple Foot Form Diabetic Foot exam was performed with the following findings:  Yes 11/25/2016 10:21 AM  Visual Inspection No deformities, no ulcerations, no other skin breakdown bilaterally:  Yes Sensation Testing Intact to touch and monofilament testing bilaterally:  Yes Pulse Check Posterior Tibialis and Dorsalis pulse intact bilaterally:  Yes Comments      Relevant past medical, surgical, family and social history reviewed and updated as indicated. Interim medical history since our last visit reviewed. Allergies and medications reviewed and updated. Outpatient Medications Prior to Visit  Medication Sig Dispense Refill  . ACCU-CHEK SOFTCLIX LANCETS lancets Use to check sugar once daily and as needed Dx: E11.9 100 each 3  . albuterol (PROVENTIL HFA;VENTOLIN HFA) 108 (90 BASE) MCG/ACT inhaler Inhale 2 puffs into the lungs every 6 (six) hours as needed for wheezing. 1 Inhaler 3  .  Alcohol Swabs PADS Use to clean finger prior to testing blood sugar. Dx: E11.9 100 each 3  . atorvastatin (LIPITOR) 20 MG tablet TAKE 1 TABLET (20 MG TOTAL) BY MOUTH NIGHTLY 90 tablet 3  . Calcium Carbonate-Vitamin D (CALCIUM 600/VITAMIN D) 600-400 MG-UNIT chew tablet Chew 1 tablet by mouth daily.    . Cholecalciferol (VITAMIN D3) 1000 units CAPS Take 1 capsule (1,000 Units total) by mouth daily.    Marland Kitchen glucose blood (ACCU-CHEK SMARTVIEW) test strip 1 each by Other route as directed. Use as instructed to check blood sugar once daily and as needed. Dx: E11.9 **NANO SMARTVIEW** 100 each 3  . lisinopril (PRINIVIL,ZESTRIL) 20 MG tablet TAKE 1 TABLET (20 MG TOTAL) BY MOUTH DAILY. 90 tablet 3  . metFORMIN (GLUCOPHAGE) 1000 MG tablet TAKE 1 TABLET BY MOUTH TWICE A DAY WITH A MEAL 180 tablet 3  . oxybutynin (DITROPAN) 5 MG tablet TAKE 1 TABLET (5 MG TOTAL) BY MOUTH 2 (TWO) TIMES DAILY AS NEEDED (INCONTINENCE). 60 tablet 6  . pantoprazole (PROTONIX) 40 MG tablet Take 40 mg by mouth daily.    . sitaGLIPtin (JANUVIA) 50 MG tablet TAKE 1 TABLET (50 MG TOTAL) BY MOUTH DAILY. 90 tablet 3  . sulfamethoxazole-trimethoprim (BACTRIM DS,SEPTRA DS) 800-160 MG tablet Take 1 tablet by mouth 2 (two) times daily. (Patient not taking: Reported on 11/25/2016) 10 tablet 0   No facility-administered medications prior to visit.      Per HPI unless specifically indicated in ROS  section below Review of Systems     Objective:    BP 140/82 (BP Location: Left Arm, Patient Position: Sitting, Cuff Size: Normal)   Pulse 88   Ht 5' 0.75" (1.543 m)   Wt 140 lb (63.5 kg)   SpO2 96%   BMI 26.67 kg/m   Wt Readings from Last 3 Encounters:  11/25/16 140 lb (63.5 kg)  06/03/16 142 lb 12 oz (64.8 kg)  05/30/16 141 lb (64 kg)    Physical Exam  Constitutional: She appears well-developed and well-nourished. No distress.  HENT:  Head: Normocephalic and atraumatic.  Right Ear: External ear normal.  Left Ear: External ear normal.    Nose: Nose normal.  Mouth/Throat: Oropharynx is clear and moist. No oropharyngeal exudate.  Eyes: Pupils are equal, round, and reactive to light. Conjunctivae and EOM are normal. No scleral icterus.  Neck: Normal range of motion. Neck supple.  Cardiovascular: Normal rate, regular rhythm, normal heart sounds and intact distal pulses.   No murmur heard. Pulmonary/Chest: Effort normal and breath sounds normal. No respiratory distress. She has no wheezes. She has no rales.  Musculoskeletal: She exhibits no edema.  See HPI for foot exam if done  Lymphadenopathy:    She has no cervical adenopathy.  Skin: Skin is warm and dry. No rash noted.  Psychiatric: She has a normal mood and affect.  Nursing note and vitals reviewed.      Assessment & Plan:   Problem List Items Addressed This Visit    Background diabetic retinopathy ([place])   Diabetes type 2, controlled ([place]) - Primary    Chronic, stable. Continue current regimen. Update A1c. Foot exam today.       Relevant Orders   Basic metabolic panel   Hemoglobin A1c   HLD (hyperlipidemia)    Update dLDL The 10-year ASCVD risk score Mikey Bussing DC Jr., et al., 2013) is: 40.2%   Values used to calculate the score:     Age: 42 years     Sex: Female     Is Non-Hispanic African American: No     Diabetic: Yes     Tobacco smoker: Yes     Systolic Blood Pressure: 275 mmHg     Is BP treated: Yes     HDL Cholesterol: 59.4 mg/dL     Total Cholesterol: 149 mg/dL       Relevant Orders   LDL Cholesterol, Direct   HTN (hypertension)    Chronic, stable. Continue current regimen.       Smoker    Action stage of cessation. Discussed correct nicorette gum use - anticipate overuse led to nausea.  Agrees to undergo referral to lung cancer screening nurse to discuss lung cancer screening CT in [place] (requests done at beginning of the month).       Relevant Orders   Ambulatory Referral for Lung Cancer Scre       Follow up plan: Return in  about 6 months (around 05/28/2017), or if symptoms worsen or fail to improve, for annual exam, prior fasting for blood work, medicare wellness visit.  Ria Bush, MD

## 2016-11-29 ENCOUNTER — Telehealth: Payer: Self-pay | Admitting: Family Medicine

## 2016-11-29 ENCOUNTER — Telehealth: Payer: Self-pay | Admitting: *Deleted

## 2016-11-29 DIAGNOSIS — Z122 Encounter for screening for malignant neoplasm of respiratory organs: Secondary | ICD-10-CM

## 2016-11-29 NOTE — Telephone Encounter (Signed)
Caller Name: Theodis Blaze Relationship to [place]: Baker Janus - graham    Reason for call: she is returning call about labs

## 2016-11-29 NOTE — Telephone Encounter (Signed)
Spoke with pt about her lab results per Dr. Darnell Level. Please refer to the result note. Nothing additional is needed.  Notes recorded by Ria Bush, MD on 11/26/2016 at 11:33 AM EDT plz notify kidneys normal, LDL chol ok, A1c a bit worse at 7.4% - ideally would want <7% but no changes for now. Keep working on diabetic diet (low sugar, low carb).

## 2016-11-29 NOTE — Telephone Encounter (Signed)
Received referral for initial lung cancer screening scan. Contacted patient and obtained smoking history,(current, 56 pack year) as well as answering questions related to screening process. Patient denies signs of lung cancer such as weight loss or hemoptysis. Patient denies comorbidity that would prevent curative treatment if lung cancer were found. Patient is scheduled for shared decision making visit and CT scan on 12/07/16.

## 2016-12-07 ENCOUNTER — Encounter: Payer: Self-pay | Admitting: Oncology

## 2016-12-07 ENCOUNTER — Inpatient Hospital Stay: Payer: Medicare Other | Attending: Oncology | Admitting: Oncology

## 2016-12-07 ENCOUNTER — Ambulatory Visit
Admission: RE | Admit: 2016-12-07 | Discharge: 2016-12-07 | Disposition: A | Discharge status: Home / Self Care | Payer: Medicare Other | Source: Ambulatory Visit | Attending: Oncology | Admitting: Oncology

## 2016-12-07 DIAGNOSIS — F17210 Nicotine dependence, cigarettes, uncomplicated: Secondary | ICD-10-CM | POA: Diagnosis not present

## 2016-12-07 DIAGNOSIS — I2510 Atherosclerotic heart disease of native coronary artery without angina pectoris: Secondary | ICD-10-CM | POA: Insufficient documentation

## 2016-12-07 DIAGNOSIS — I700 Atherosclerosis of aorta: Secondary | ICD-10-CM | POA: Insufficient documentation

## 2016-12-07 DIAGNOSIS — Z122 Encounter for screening for malignant neoplasm of respiratory organs: Secondary | ICD-10-CM | POA: Diagnosis not present

## 2016-12-07 DIAGNOSIS — Z87891 Personal history of nicotine dependence: Secondary | ICD-10-CM | POA: Insufficient documentation

## 2016-12-07 NOTE — Progress Notes (Signed)
In accordance with CMS guidelines, patient has met eligibility criteria including age, absence of signs or symptoms of lung cancer.  Social History  Substance Use Topics  . Smoking status: Current Some Day Smoker    Packs/day: 1.00    Years: 56.00    Types: Cigarettes    Start date: 04/25/1968  . Smokeless tobacco: Never Used     Comment: smokes 1 pack week currently  . Alcohol use No     A shared decision-making session was conducted prior to the performance of CT scan. This includes one or more decision aids, includes benefits and harms of screening, follow-up diagnostic testing, over-diagnosis, false positive rate, and total radiation exposure.  Counseling on the importance of adherence to annual lung cancer LDCT screening, impact of co-morbidities, and ability or willingness to undergo diagnosis and treatment is imperative for compliance of the program.  Counseling on the importance of continued smoking cessation for former smokers; the importance of smoking cessation for current smokers, and information about tobacco cessation interventions have been given to patient including [place] and [facility] quit [place] programs.  Written order for lung cancer screening with LDCT has been given to the patient and any and all questions have been answered to the best of my abilities.   Yearly follow up will be coordinated by Burgess Estelle, Thoracic Navigator.  Faythe Casa, NP 12/07/2016 9:19 AM

## 2016-12-08 ENCOUNTER — Encounter: Payer: Self-pay | Admitting: *Deleted

## 2016-12-10 ENCOUNTER — Encounter: Payer: Self-pay | Admitting: Family Medicine

## 2016-12-10 DIAGNOSIS — I2510 Atherosclerotic heart disease of native coronary artery without angina pectoris: Secondary | ICD-10-CM | POA: Insufficient documentation

## 2016-12-10 DIAGNOSIS — I700 Atherosclerosis of aorta: Secondary | ICD-10-CM | POA: Insufficient documentation

## 2017-02-10 DIAGNOSIS — E113293 Type 2 diabetes mellitus with mild nonproliferative diabetic retinopathy without macular edema, bilateral: Secondary | ICD-10-CM | POA: Diagnosis not present

## 2017-02-10 LAB — HM DIABETES EYE EXAM

## 2017-02-24 ENCOUNTER — Encounter: Payer: Self-pay | Admitting: Family Medicine

## 2017-03-02 ENCOUNTER — Encounter: Payer: Self-pay | Admitting: Family Medicine

## 2017-05-08 ENCOUNTER — Other Ambulatory Visit: Payer: Self-pay | Admitting: Family Medicine

## 2017-05-08 DIAGNOSIS — Z1231 Encounter for screening mammogram for malignant neoplasm of breast: Secondary | ICD-10-CM

## 2017-05-12 ENCOUNTER — Other Ambulatory Visit: Payer: Self-pay | Admitting: *Deleted

## 2017-05-12 DIAGNOSIS — I6523 Occlusion and stenosis of bilateral carotid arteries: Secondary | ICD-10-CM

## 2017-05-22 ENCOUNTER — Other Ambulatory Visit: Payer: Self-pay | Admitting: Family Medicine

## 2017-05-28 ENCOUNTER — Other Ambulatory Visit: Payer: Self-pay | Admitting: Family Medicine

## 2017-05-28 DIAGNOSIS — E119 Type 2 diabetes mellitus without complications: Secondary | ICD-10-CM

## 2017-05-28 DIAGNOSIS — R7989 Other specified abnormal findings of blood chemistry: Secondary | ICD-10-CM

## 2017-05-28 DIAGNOSIS — E785 Hyperlipidemia, unspecified: Secondary | ICD-10-CM

## 2017-05-28 DIAGNOSIS — M8580 Other specified disorders of bone density and structure, unspecified site: Secondary | ICD-10-CM

## 2017-05-31 ENCOUNTER — Ambulatory Visit (INDEPENDENT_AMBULATORY_CARE_PROVIDER_SITE_OTHER): Payer: Medicare Other

## 2017-05-31 VITALS — BP 132/84 | HR 90 | Temp 97.8°F | Ht 61.0 in | Wt 138.2 lb

## 2017-05-31 DIAGNOSIS — R7989 Other specified abnormal findings of blood chemistry: Secondary | ICD-10-CM

## 2017-05-31 DIAGNOSIS — E119 Type 2 diabetes mellitus without complications: Secondary | ICD-10-CM

## 2017-05-31 DIAGNOSIS — E785 Hyperlipidemia, unspecified: Secondary | ICD-10-CM | POA: Diagnosis not present

## 2017-05-31 DIAGNOSIS — Z0000 Encounter for general adult medical examination without abnormal findings: Secondary | ICD-10-CM | POA: Diagnosis not present

## 2017-05-31 DIAGNOSIS — Z23 Encounter for immunization: Secondary | ICD-10-CM

## 2017-05-31 DIAGNOSIS — M8580 Other specified disorders of bone density and structure, unspecified site: Secondary | ICD-10-CM

## 2017-05-31 LAB — TSH: TSH: 4.05 u[IU]/mL (ref 0.35–4.50)

## 2017-05-31 LAB — COMPREHENSIVE METABOLIC PANEL
ALBUMIN: 4 g/dL (ref 3.5–5.2)
ALK PHOS: 86 U/L (ref 39–117)
ALT: 78 U/L — AB (ref 0–35)
AST: 77 U/L — AB (ref 0–37)
BILIRUBIN TOTAL: 0.4 mg/dL (ref 0.2–1.2)
BUN: 21 mg/dL (ref 6–23)
CALCIUM: 9.4 mg/dL (ref 8.4–10.5)
CO2: 28 mEq/L (ref 19–32)
CREATININE: 1.07 mg/dL (ref 0.40–1.20)
Chloride: 106 mEq/L (ref 96–112)
GFR: 53.62 mL/min — AB (ref >60.00)
Glucose, Bld: 137 mg/dL — ABNORMAL HIGH (ref 70–99)
Potassium: 5 mEq/L (ref 3.5–5.1)
Sodium: 140 mEq/L (ref 135–145)
Total Protein: 6.9 g/dL (ref 6.0–8.3)

## 2017-05-31 LAB — LIPID PANEL
CHOLESTEROL: 137 mg/dL (ref 0–200)
HDL: 53.4 mg/dL (ref >39.00)
LDL Cholesterol: 69 mg/dL (ref 0–99)
NonHDL: 83.56
TRIGLYCERIDES: 74 mg/dL (ref 0.0–149.0)
Total CHOL/HDL Ratio: 3
VLDL: 14.8 mg/dL (ref 0.0–40.0)

## 2017-05-31 LAB — VITAMIN D 25 HYDROXY (VIT D DEFICIENCY, FRACTURES): VITD: 20.68 ng/mL — ABNORMAL LOW (ref 30.00–100.00)

## 2017-05-31 LAB — HEMOGLOBIN A1C: Hgb A1c MFr Bld: 7.3 % — ABNORMAL HIGH (ref 4.6–6.5)

## 2017-05-31 LAB — T4, FREE: Free T4: 0.91 ng/dL (ref 0.60–1.60)

## 2017-05-31 NOTE — Patient Instructions (Signed)
Cheryl Bird , Thank you for taking time to come for your Medicare Wellness Visit. I appreciate your ongoing commitment to your health goals. Please review the following plan we discussed and let me know if I can assist you in the future.   These are the goals we discussed: Goals    . Increase physical activity     Starting 05/31/2017, I will continue to exercise for 30-35 minutes daily.        This is a list of the screening recommended for you and due dates:  Health Maintenance  Topic Date Due  . DTaP/Tdap/Td vaccine (1 - Tdap) 12/22/2021*  . Complete foot exam   11/25/2017  . Hemoglobin A1C  11/28/2017  . Eye exam for diabetics  02/10/2018  . Mammogram  06/15/2018  . Colon Cancer Screening  05/31/2019  . Tetanus Vaccine  12/22/2021  . Flu Shot  Completed  . DEXA scan (bone density measurement)  Completed  .  Hepatitis C: One time screening is recommended by Center for Disease Control  (CDC) for  adults born from [birth year] through 1965.   Completed  . Pneumonia vaccines  Completed  *Topic was postponed. The date shown is not the original due date.   Preventive Care for Adults  A healthy lifestyle and preventive care can promote health and wellness. Preventive health guidelines for adults include the following key practices.  . A routine yearly physical is a good way to check with your health care provider about your health and preventive screening. It is a chance to share any concerns and updates on your health and to receive a thorough exam.  . Visit your dentist for a routine exam and preventive care every 6 months. Brush your teeth twice a day and floss once a day. Good oral hygiene prevents tooth decay and gum disease.  . The frequency of eye exams is based on your age, health, family medical history, use  of contact lenses, and other factors. Follow your health care provider's recommendations for frequency of eye exams.  . Eat a healthy diet. Foods like vegetables, fruits, whole  grains, low-fat dairy products, and lean protein foods contain the nutrients you need without too many calories. Decrease your intake of foods high in solid fats, added sugars, and salt. Eat the right amount of calories for you. Get information about a proper diet from your health care provider, if necessary.  . Regular physical exercise is one of the most important things you can do for your health. Most adults should get at least 150 minutes of moderate-intensity exercise (any activity that increases your heart rate and causes you to sweat) each week. In addition, most adults need muscle-strengthening exercises on 2 or more days a week.  Silver Sneakers may be a benefit available to you. To determine eligibility, you may visit the website: www.silversneakers.com or contact program at 760-754-3048 Mon-Fri between 8AM-8PM.   . Maintain a healthy weight. The body mass index (BMI) is a screening tool to identify possible weight problems. It provides an estimate of body fat based on height and weight. Your health care provider can find your BMI and can help you achieve or maintain a healthy weight.   For adults 20 years and older: ? A BMI below 18.5 is considered underweight. ? A BMI of 18.5 to 24.9 is normal. ? A BMI of 25 to 29.9 is considered overweight. ? A BMI of 30 and above is considered obese.   . Maintain normal blood  lipids and cholesterol levels by exercising and minimizing your intake of saturated fat. Eat a balanced diet with plenty of fruit and vegetables. Blood tests for lipids and cholesterol should begin at age 91 and be repeated every 5 years. If your lipid or cholesterol levels are high, you are over 50, or you are at high risk for heart disease, you may need your cholesterol levels checked more frequently. Ongoing high lipid and cholesterol levels should be treated with medicines if diet and exercise are not working.  . If you smoke, find out from your health care provider how to  quit. If you do not use tobacco, please do not start.  . If you choose to drink alcohol, please do not consume more than 2 drinks per day. One drink is considered to be 12 ounces (355 mL) of beer, 5 ounces (148 mL) of wine, or 1.5 ounces (44 mL) of liquor.  . If you are 74-71 years old, ask your health care provider if you should take aspirin to prevent strokes.  . Use sunscreen. Apply sunscreen liberally and repeatedly throughout the day. You should seek shade when your shadow is shorter than you. Protect yourself by wearing long sleeves, pants, a wide-brimmed hat, and sunglasses year round, whenever you are outdoors.  . Once a month, do a whole body skin exam, using a mirror to look at the skin on your back. Tell your health care provider of new moles, moles that have irregular borders, moles that are larger than a pencil eraser, or moles that have changed in shape or color.

## 2017-05-31 NOTE — Progress Notes (Signed)
Subjective:   Cheryl Bird is a 72 y.o. female who presents for Medicare Annual (Subsequent) preventive examination.  Review of Systems:  N/A Cardiac Risk Factors include: advanced age (>55men, >50 women);diabetes mellitus;dyslipidemia;hypertension;smoking/ tobacco exposure     Objective:     Vitals: BP 132/84 (BP Location: Right Arm, Patient Position: Sitting, Cuff Size: Normal)   Pulse 90   Temp 97.8 F (36.6 C) (Oral)   Ht 5\' 1"  (1.549 m) Comment: no shoes  Wt 138 lb 4 oz (62.7 kg)   SpO2 92%   BMI 26.12 kg/m   Body mass index is 26.12 kg/m.  Advanced Directives 05/31/2017 05/30/2016 05/27/2016 12/21/2015  Does Patient Have a Medical Advance Directive? No No No No  Would patient like information on creating a medical advance directive? No - Patient declined - - No - patient declined information    Tobacco Social History   Tobacco Use  Smoking Status Current Some Day Smoker  . Packs/day: 1.00  . Years: 56.00  . Pack years: 56.00  . Types: Cigarettes  . Start date: 04/25/1968  Smokeless Tobacco Never Used  Tobacco Comment   smokes 1 pack week currently     Ready to quit: No Counseling given: No Comment: smokes 1 pack week currently   Clinical Intake:  Pre-visit preparation completed: Yes  Pain : No/denies pain Pain Score: 0-No pain     Nutritional Status: BMI of 19-24  Normal Nutritional Risks: None Diabetes: Yes CBG done?: No Did pt. bring in CBG monitor from home?: No  How often do you need to have someone help you when you read instructions, pamphlets, or other written materials from your doctor or pharmacy?: 1 - Never What is the last grade level you completed in school?: GED  Interpreter Needed?: No  Comments: pt is a widow and lives alone Information entered by :: LPinson, LPN  Past Medical History:  Diagnosis Date  . Adenomatous rectal polyp 09/2004   Nicolasa Ducking)  . Background diabetic retinopathy ([place]) 01/2016  . Barrett esophagus 02/2012   on EGD biopsy Gustavo Lah)  . Carotid stenosis 06/04/2016   40-59% RICA stenosis. 3-24% LICA stenosis. Repeat yearly (06/2016)  . COPD, moderate ([place]) 02/12/2013   Spirometry - 01/2013: FEV1 = 59%, post alb 78%, ratio 0.6, mod severe obstruction, good response to bronchodilator   . Diabetes type 2, controlled ([place])    DMSE @ [facility] 11/2011  . Diverticulosis 09/2004   by colonoscopy  . Ex-smoker 01/2013  . GERD (gastroesophageal reflux disease)   . History of cleft palate   . HLD (hyperlipidemia)   . HTN (hypertension)    dx per prior records  . Osteopenia 05/2015   T -1.4 femur, foreamr -0.7  . Right-sided sensorineural hearing loss    from infection as child, hsa been seen by Dr. Pryor Ochoa   Past Surgical History:  Procedure Laterality Date  . BLADDER SUSPENSION    . CATARACT EXTRACTION  2013   Right  . COLONOSCOPY  10/08/2004   rectal polyp (tubular adenoma), diverticulosis - Dr. Nicolasa Ducking  . COLONOSCOPY  02/2012   multiple polyps - tubular and sessile adenomas, diverticulosis, decreased rectal tone ([place]) rec rpt 3 yrs  . COLONOSCOPY  2017   stool present, to reschedule Gustavo Lah)  . COLONOSCOPY WITH PROPOFOL N/A 12/21/2015   Procedure: COLONOSCOPY WITH PROPOFOL;  Surgeon: Lollie Sails, MD;  Location: [facility] ENDOSCOPY;  Service: Endoscopy;  Laterality: N/A;  . COLONOSCOPY WITH PROPOFOL N/A 05/30/2016   TAx3, rpt  3 yrs Lollie Sails, MD)  . ESOPHAGOGASTRODUODENOSCOPY  02/2012   irregular z line (+barretts), normal stomach and duodenum, rpt EGD 1 yr  . ESOPHAGOGASTRODUODENOSCOPY  05/2013   irregular z line (+barretts), HH, reflux gastroesophagitis, rpt 2 yrs  . ESOPHAGOGASTRODUODENOSCOPY  11/2015   chronic gastritis, reflux esophagitis, + barrett's, + H pylori, rpt 2 yrs ([place])  . ESOPHAGOGASTRODUODENOSCOPY (EGD) WITH PROPOFOL N/A 12/21/2015   Procedure: ESOPHAGOGASTRODUODENOSCOPY (EGD) WITH PROPOFOL;  Surgeon: Lollie Sails, MD;  Location: [facility] ENDOSCOPY;  Service:  Endoscopy;  Laterality: N/A;  . EYE SURGERY    . TONSILLECTOMY AND ADENOIDECTOMY    . TUBAL LIGATION     Family History  Problem Relation Age of Onset  . Diabetes Maternal Aunt   . Alcohol abuse Mother   . Heart disease Mother        enlarged heart  . Kidney failure Mother   . Hypertension Mother   . Coronary artery disease Father 89  . Coronary artery disease Brother 84  . Cancer Sister        kidney   . Stroke Neg Hx   . Breast cancer Neg Hx    Social History   Socioeconomic History  . Marital status: Single    Spouse name: None  . Number of children: None  . Years of education: None  . Highest education level: None  Social Needs  . Financial resource strain: None  . Food insecurity - worry: None  . Food insecurity - inability: None  . Transportation needs - medical: None  . Transportation needs - non-medical: None  Occupational History  . None  Tobacco Use  . Smoking status: Current Some Day Smoker    Packs/day: 1.00    Years: 56.00    Pack years: 56.00    Types: Cigarettes    Start date: 04/25/1968  . Smokeless tobacco: Never Used  . Tobacco comment: smokes 1 pack week currently  Substance and Sexual Activity  . Alcohol use: No  . Drug use: No  . Sexual activity: No  Other Topics Concern  . None  Social History Narrative   Caffeine: 2 cups coffee/day   [place] up in [place]   Occupation: retired   Edu: HS   Activity: walks daily 78min   Diet: good water, fruits/vegetables daily      Advanced directives: pt aware of this. Would want HCPOA to be daughter. Son in Sports coach is Therapist, sports of finances. Does want to stay on life support if reversible, otherwise doesn't want prolonged life support. Has spoken with daughter/son in law about this. Has living will/advanced directive at home but it's not notarized.    Outpatient Encounter Medications as of 05/31/2017  Medication Sig  . ACCU-CHEK SOFTCLIX LANCETS lancets Use to check sugar once daily and as needed Dx: E11.9    . albuterol (PROVENTIL HFA;VENTOLIN HFA) 108 (90 BASE) MCG/ACT inhaler Inhale 2 puffs into the lungs every 6 (six) hours as needed for wheezing.  . Alcohol Swabs PADS Use to clean finger prior to testing blood sugar. Dx: E11.9  . atorvastatin (LIPITOR) 20 MG tablet TAKE 1 TABLET (20 MG TOTAL) BY MOUTH NIGHTLY  . Calcium Carbonate-Vitamin D (CALCIUM 600/VITAMIN D) 600-400 MG-UNIT chew tablet Chew 1 tablet by mouth daily.  . Cholecalciferol (VITAMIN D3) 1000 units CAPS Take 1 capsule (1,000 Units total) by mouth daily.  Marland Kitchen glucose blood (ACCU-CHEK SMARTVIEW) test strip 1 each by Other route as directed. Use as instructed to check blood sugar once  daily and as needed. Dx: E11.9 **NANO SMARTVIEW**  . lisinopril (PRINIVIL,ZESTRIL) 20 MG tablet TAKE 1 TABLET (20 MG TOTAL) BY MOUTH DAILY.  . metFORMIN (GLUCOPHAGE) 1000 MG tablet TAKE 1 TABLET BY MOUTH TWICE A DAY WITH A MEAL  . oxybutynin (DITROPAN) 5 MG tablet TAKE 1 TABLET (5 MG TOTAL) BY MOUTH 2 (TWO) TIMES DAILY AS NEEDED (INCONTINENCE).  Marland Kitchen pantoprazole (PROTONIX) 40 MG tablet Take 40 mg by mouth daily.  . sitaGLIPtin (JANUVIA) 50 MG tablet TAKE 1 TABLET (50 MG TOTAL) BY MOUTH DAILY.   No facility-administered encounter medications on file as of 05/31/2017.     Activities of Daily Living In your present state of health, do you have any difficulty performing the following activities: 05/31/2017  Hearing? Y  Vision? N  Difficulty concentrating or making decisions? N  Walking or climbing stairs? N  Dressing or bathing? N  Doing errands, shopping? N  Preparing Food and eating ? N  Using the Toilet? N  In the past six months, have you accidently leaked urine? N  Do you have problems with loss of bowel control? Y  Managing your Medications? N  Managing your Finances? N  Housekeeping or managing your Housekeeping? N  Some recent data might be hidden    Patient Care Team: Ria Bush, MD as PCP - General (Family Medicine) Dingeldein,  Remo Lipps, MD as Consulting Physician (Ophthalmology) Lollie Sails, MD as Consulting Physician (Gastroenterology) Ok Edwards, NP as Nurse Practitioner (Gastroenterology)    Assessment:   This is a routine wellness examination for Jessalynn.   Hearing Screening   125Hz  250Hz  500Hz  1000Hz  2000Hz  3000Hz  4000Hz  6000Hz  8000Hz   Right ear:           Left ear:   40 0 40  0    Comments: Legally deaf in right ear  Vision Screening Comments: Last vision exam in Oct 2018 with Dr. Sandra Cockayne   Exercise Activities and Dietary recommendations Current Exercise Habits: Home exercise routine, Type of exercise: walking, Time (Minutes): 30, Frequency (Times/Week): 7, Weekly Exercise (Minutes/Week): 210, Intensity: Mild, Exercise limited by: None identified  Goals    . Increase physical activity     Starting 05/31/2017, I will continue to exercise for 30-35 minutes daily.        Fall Risk Fall Risk  05/31/2017 05/27/2016 06/02/2015 05/28/2014 05/13/2013  Falls in the past year? Yes No No No No  Comment 2 falls during inclement weather; 1 fall due to hole in yard; denies injury with any fall - - - -  Number falls in past yr: 2 or more - - - -  Injury with Fall? No - - - -  Risk for fall due to : - - - - -   Depression Screen PHQ 2/9 Scores 05/31/2017 05/27/2016 06/02/2015 05/28/2014  PHQ - 2 Score 0 0 0 0  PHQ- 9 Score 0 - - -     Cognitive Function MMSE - Mini Mental State Exam 05/31/2017 05/27/2016  Orientation to time 5 5  Orientation to Place 5 5  Registration 3 3  Attention/ Calculation 0 0  Recall 3 3  Language- name 2 objects 0 0  Language- repeat 1 1  Language- follow 3 step command 3 3  Language- read & follow direction 0 0  Write a sentence 0 0  Copy design 0 0  Total score 20 20       PLEASE NOTE: A Mini-Cog screen was completed. Maximum score is 20. A  value of 0 denotes this part of [place] MMSE was not completed or the patient failed this part of the Mini-Cog screening.    Mini-Cog Screening Orientation to Time - Max 5 pts Orientation to Place - Max 5 pts Registration - Max 3 pts Recall - Max 3 pts Language Repeat - Max 1 pts Language Follow 3 Step Command - Max 3 pts   Immunization History  Administered Date(s) Administered  . Influenza Split 03/26/2012  . Influenza,inj,Quad PF,6+ Mos 01/04/2013, 01/09/2014, 06/02/2015, 05/27/2016, 05/31/2017  . Pneumococcal Conjugate-13 01/09/2014  . Pneumococcal Polysaccharide-23 12/23/2011  . Td 12/23/2011    Screening Tests Health Maintenance  Topic Date Due  . DTaP/Tdap/Td (1 - Tdap) 12/22/2021 (Originally 12/24/2011)  . FOOT EXAM  11/25/2017  . HEMOGLOBIN A1C  11/28/2017  . OPHTHALMOLOGY EXAM  02/10/2018  . MAMMOGRAM  06/15/2018  . COLONOSCOPY  05/31/2019  . TETANUS/TDAP  12/22/2021  . INFLUENZA VACCINE  Completed  . DEXA SCAN  Completed  . Hepatitis C Screening  Completed  . PNA vac Low Risk Adult  Completed      Plan:     I have personally reviewed, addressed, and noted the following in the patient's chart:  A. Medical and social history B. Use of alcohol, tobacco or illicit drugs  C. Current medications and supplements D. Functional ability and status E.  Nutritional status F.  Physical activity G. Advance directives H. List of other physicians I.  Hospitalizations, surgeries, and ER visits in previous 12 months J.  [place] to include hearing, vision, cognitive, depression L. Referrals and appointments - none  In addition, I have reviewed and discussed with patient certain preventive protocols, quality metrics, and best practice recommendations. A written personalized care plan for preventive services as well as general preventive health recommendations were provided to patient.  See attached scanned questionnaire for additional information.   Signed,   Lindell Noe, MHA, BS, LPN Health Coach

## 2017-05-31 NOTE — Progress Notes (Signed)
PCP notes:   Health maintenance:  A1C - completed  Abnormal screenings:   Fall risk - hx of 3 falls without injury Fall Risk  05/31/2017 05/27/2016 06/02/2015 05/28/2014 05/13/2013  Falls in the past year? Yes No No No No  Comment 2 falls during inclement weather; 1 fall due to hole in yard; denies injury with any fall - - - -  Number falls in past yr: 2 or more - - - -  Injury with Fall? No - - - -  Risk for fall due to : - - - - -   Hearing - failed(left ear)  Hearing Screening   125Hz  250Hz  500Hz  1000Hz  2000Hz  3000Hz  4000Hz  6000Hz  8000Hz   Right ear:           Left ear:   40 0 40  0    Comments: Legally deaf in right ear  Patient concerns:   None  Nurse concerns:  None  Next PCP appt:   06/05/17 @ 1030

## 2017-05-31 NOTE — Progress Notes (Signed)
Pre visit review using our clinic review tool, if applicable. No additional management support is needed unless otherwise documented below in the visit note. 

## 2017-05-31 NOTE — Progress Notes (Signed)
I reviewed health advisor's note, was available for consultation, and agree with documentation and plan.  

## 2017-06-05 ENCOUNTER — Ambulatory Visit (INDEPENDENT_AMBULATORY_CARE_PROVIDER_SITE_OTHER): Payer: Medicare Other | Admitting: Family Medicine

## 2017-06-05 ENCOUNTER — Encounter: Payer: Self-pay | Admitting: Family Medicine

## 2017-06-05 VITALS — BP 134/78 | HR 96 | Temp 98.1°F | Ht 61.0 in | Wt 139.5 lb

## 2017-06-05 DIAGNOSIS — E785 Hyperlipidemia, unspecified: Secondary | ICD-10-CM

## 2017-06-05 DIAGNOSIS — M8580 Other specified disorders of bone density and structure, unspecified site: Secondary | ICD-10-CM

## 2017-06-05 DIAGNOSIS — Z7189 Other specified counseling: Secondary | ICD-10-CM | POA: Diagnosis not present

## 2017-06-05 DIAGNOSIS — E113299 Type 2 diabetes mellitus with mild nonproliferative diabetic retinopathy without macular edema, unspecified eye: Secondary | ICD-10-CM | POA: Diagnosis not present

## 2017-06-05 DIAGNOSIS — K2100 Gastro-esophageal reflux disease with esophagitis, without bleeding: Secondary | ICD-10-CM

## 2017-06-05 DIAGNOSIS — R7989 Other specified abnormal findings of blood chemistry: Secondary | ICD-10-CM | POA: Diagnosis not present

## 2017-06-05 DIAGNOSIS — K2270 Barrett's esophagus without dysplasia: Secondary | ICD-10-CM | POA: Diagnosis not present

## 2017-06-05 DIAGNOSIS — E1165 Type 2 diabetes mellitus with hyperglycemia: Secondary | ICD-10-CM

## 2017-06-05 DIAGNOSIS — IMO0002 Reserved for concepts with insufficient information to code with codable children: Secondary | ICD-10-CM

## 2017-06-05 DIAGNOSIS — E1122 Type 2 diabetes mellitus with diabetic chronic kidney disease: Secondary | ICD-10-CM | POA: Insufficient documentation

## 2017-06-05 DIAGNOSIS — J449 Chronic obstructive pulmonary disease, unspecified: Secondary | ICD-10-CM

## 2017-06-05 DIAGNOSIS — I10 Essential (primary) hypertension: Secondary | ICD-10-CM

## 2017-06-05 DIAGNOSIS — Z0000 Encounter for general adult medical examination without abnormal findings: Secondary | ICD-10-CM | POA: Diagnosis not present

## 2017-06-05 DIAGNOSIS — R7401 Elevation of levels of liver transaminase levels: Secondary | ICD-10-CM | POA: Insufficient documentation

## 2017-06-05 DIAGNOSIS — E118 Type 2 diabetes mellitus with unspecified complications: Secondary | ICD-10-CM | POA: Diagnosis not present

## 2017-06-05 DIAGNOSIS — I6523 Occlusion and stenosis of bilateral carotid arteries: Secondary | ICD-10-CM

## 2017-06-05 DIAGNOSIS — R740 Nonspecific elevation of levels of transaminase and lactic acid dehydrogenase [LDH]: Secondary | ICD-10-CM

## 2017-06-05 DIAGNOSIS — F17200 Nicotine dependence, unspecified, uncomplicated: Secondary | ICD-10-CM

## 2017-06-05 DIAGNOSIS — N183 Chronic kidney disease, stage 3 (moderate): Secondary | ICD-10-CM

## 2017-06-05 DIAGNOSIS — I700 Atherosclerosis of aorta: Secondary | ICD-10-CM

## 2017-06-05 DIAGNOSIS — I2510 Atherosclerotic heart disease of native coronary artery without angina pectoris: Secondary | ICD-10-CM

## 2017-06-05 DIAGNOSIS — N289 Disorder of kidney and ureter, unspecified: Secondary | ICD-10-CM

## 2017-06-05 MED ORDER — METFORMIN HCL 1000 MG PO TABS: 1000.0000 mg | ORAL_TABLET | Freq: Two times a day (BID) | ORAL | 3 refills | Status: DC

## 2017-06-05 MED ORDER — VITAMIN D3 25 MCG (1000 UT) PO CAPS: 2.0000 | ORAL_CAPSULE | Freq: Every day | ORAL | Status: DC

## 2017-06-05 MED ORDER — LISINOPRIL 20 MG PO TABS: ORAL_TABLET | ORAL | 3 refills | Status: DC

## 2017-06-05 MED ORDER — ATORVASTATIN CALCIUM 20 MG PO TABS: ORAL_TABLET | ORAL | 3 refills | Status: DC

## 2017-06-05 MED ORDER — PANTOPRAZOLE SODIUM 40 MG PO TBEC: 40.0000 mg | DELAYED_RELEASE_TABLET | Freq: Every day | ORAL | 3 refills | Status: DC

## 2017-06-05 MED ORDER — SITAGLIPTIN PHOSPHATE 50 MG PO TABS: ORAL_TABLET | ORAL | 3 refills | Status: DC

## 2017-06-05 NOTE — Assessment & Plan Note (Signed)
Preventative protocols reviewed and updated unless pt declined. Discussed healthy diet and lifestyle.  

## 2017-06-05 NOTE — Assessment & Plan Note (Addendum)
Stable off controller medication, albuterol rarely PRN. Continued smoker

## 2017-06-05 NOTE — Assessment & Plan Note (Signed)
Will continue to monitor.

## 2017-06-05 NOTE — Addendum Note (Signed)
Addended by: Ria Bush on: 06/05/2017 12:20 PM   Modules accepted: Orders

## 2017-06-05 NOTE — Assessment & Plan Note (Signed)
By CT - continue aspirin, statin.

## 2017-06-05 NOTE — Assessment & Plan Note (Signed)
Chronic, stable on statin.  The 10-year ASCVD risk score Mikey Bussing DC Brooke Bonito., et al., 2013) is: 37.4%   Values used to calculate the score:     Age: 72 years     Sex: Female     Is Non-Hispanic African American: No     Diabetic: Yes     Tobacco smoker: Yes     Systolic Blood Pressure: 888 mmHg     Is BP treated: Yes     HDL Cholesterol: 53.4 mg/dL     Total Cholesterol: 137 mg/dL

## 2017-06-05 NOTE — Assessment & Plan Note (Signed)
Advanced directives: full code. Would want HCPOA to be daughter. Son in Sports coach is Therapist, sports of finances. Wants trial of life support if deemed reversible. Otherwise doesn't want prolonged life support. Has spoken with daughter/son in law about this. Has advanced directive packet at home.

## 2017-06-05 NOTE — ACP (Advance Care Planning) (Signed)
Advanced directives: full code. Would want HCPOA to be daughter. Son in Sports coach is Therapist, sports of finances. Wants trial of life support if deemed reversible. Otherwise doesn't want prolonged life support. Has spoken with daughter/son in law about this. Has advanced directive packet at home.

## 2017-06-05 NOTE — Assessment & Plan Note (Signed)
Continue PPI daily. Will be due for EGD this summer.

## 2017-06-05 NOTE — Assessment & Plan Note (Signed)
Chronic, stable. Continue current regimen. 

## 2017-06-05 NOTE — Assessment & Plan Note (Signed)
New - will continue to monitor.

## 2017-06-05 NOTE — Assessment & Plan Note (Addendum)
By CT - continue aspirin, statin.

## 2017-06-05 NOTE — Assessment & Plan Note (Signed)
Continue daily PPI.  

## 2017-06-05 NOTE — Assessment & Plan Note (Signed)
Chronic, in action phase - actively cutting down. <1 pack per week

## 2017-06-05 NOTE — Assessment & Plan Note (Addendum)
Reviewed cal/vit D dosing.  rec 1 glass milk daily.

## 2017-06-05 NOTE — Patient Instructions (Addendum)
You will be due for endoscopy 11/2017 - call us if you don't hear from them by this summer.  Increase vitamin D to 2000 units daily.  Sugar and kidneys and liver were a bit elevated - we will watch this.  Return in 6 months for labs and then follow up visit.   Health Maintenance, Female Adopting a healthy lifestyle and getting preventive care can go a long way to promote health and wellness. Talk with your health care provider about what schedule of regular examinations is right for you. This is a good chance for you to check in with your provider about disease prevention and staying healthy. In between checkups, there are plenty of things you can do on your own. Experts have done a lot of research about which lifestyle changes and preventive measures are most likely to keep you healthy. Ask your health care provider for more information. Weight and diet Eat a healthy diet  Be sure to include plenty of vegetables, fruits, low-fat dairy products, and lean protein.  Do not eat a lot of foods high in solid fats, added sugars, or salt.  Get regular exercise. This is one of the most important things you can do for your health. ? Most adults should exercise for at least 150 minutes each week. The exercise should increase your heart rate and make you sweat (moderate-intensity exercise). ? Most adults should also do strengthening exercises at least twice a week. This is in addition to the moderate-intensity exercise.  Maintain a healthy weight  Body mass index (BMI) is a measurement that can be used to identify possible weight problems. It estimates body fat based on height and weight. Your health care provider can help determine your BMI and help you achieve or maintain a healthy weight.  For females 72 years of age and older: ? A BMI below 18.5 is considered underweight. ? A BMI of 18.5 to 24.9 is normal. ? A BMI of 25 to 29.9 is considered overweight. ? A BMI of 30 and above is considered  obese.  Watch levels of cholesterol and blood lipids  You should start having your blood tested for lipids and cholesterol at 72 years of age, then have this test every 5 years.  You may need to have your cholesterol levels checked more often if: ? Your lipid or cholesterol levels are high. ? You are older than 72 years of age. ? You are at high risk for heart disease.  Cancer screening Lung Cancer  Lung cancer screening is recommended for adults 97-72 years old who are at high risk for lung cancer because of a history of smoking.  A yearly low-dose CT scan of the lungs is recommended for people who: ? Currently smoke. ? Have quit within the past 15 years. ? Have at least a 30-pack-year history of smoking. A pack year is smoking an average of one pack of cigarettes a day for 1 year.  Yearly screening should continue until it has been 15 years since you quit.  Yearly screening should stop if you develop a health problem that would prevent you from having lung cancer treatment.  Breast Cancer  Practice breast self-awareness. This means understanding how your breasts normally appear and feel.  It also means doing regular breast self-exams. Let your health care provider know about any changes, no matter how small.  If you are in your 20s or 30s, you should have a clinical breast exam (CBE) by a health care provider every  1-3 years as part of a regular health exam.  If you are 23 or older, have a CBE every year. Also consider having a breast X-ray (mammogram) every year.  If you have a family history of breast cancer, talk to your health care provider about genetic screening.  If you are at high risk for breast cancer, talk to your health care provider about having an MRI and a mammogram every year.  Breast cancer gene (BRCA) assessment is recommended for women who have family members with BRCA-related cancers. BRCA-related cancers  include: ? Breast. ? Ovarian. ? Tubal. ? Peritoneal cancers.  Results of the assessment will determine the need for genetic counseling and BRCA1 and BRCA2 testing.  Cervical Cancer Your health care provider may recommend that you be screened regularly for cancer of the pelvic organs (ovaries, uterus, and vagina). This screening involves a pelvic examination, including checking for microscopic changes to the surface of your cervix (Pap test). You may be encouraged to have this screening done every 3 years, beginning at age 71.  For women ages 51-65, health care providers may recommend pelvic exams and Pap testing every 3 years, or they may recommend the Pap and pelvic exam, combined with testing for human papilloma virus (HPV), every 5 years. Some types of HPV increase your risk of cervical cancer. Testing for HPV may also be done on women of any age with unclear Pap test results.  Other health care providers may not recommend any screening for nonpregnant women who are considered low risk for pelvic cancer and who do not have symptoms. Ask your health care provider if a screening pelvic exam is right for you.  If you have had past treatment for cervical cancer or a condition that could lead to cancer, you need Pap tests and screening for cancer for at least 20 years after your treatment. If Pap tests have been discontinued, your risk factors (such as having a new sexual partner) need to be reassessed to determine if screening should resume. Some women have medical problems that increase the chance of getting cervical cancer. In these cases, your health care provider may recommend more frequent screening and Pap tests.  Colorectal Cancer  This type of cancer can be detected and often prevented.  Routine colorectal cancer screening usually begins at 72 years of age and continues through 72 years of age.  Your health care provider may recommend screening at an earlier age if you have risk factors  for colon cancer.  Your health care provider may also recommend using home test kits to check for hidden blood in the stool.  A small camera at the end of a tube can be used to examine your colon directly (sigmoidoscopy or colonoscopy). This is done to check for the earliest forms of colorectal cancer.  Routine screening usually begins at age 30.  Direct examination of the colon should be repeated every 5-10 years through 72 years of age. However, you may need to be screened more often if early forms of precancerous polyps or small growths are found.  Skin Cancer  Check your skin from head to toe regularly.  Tell your health care provider about any new moles or changes in moles, especially if there is a change in a mole's shape or color.  Also tell your health care provider if you have a mole that is larger than the size of a pencil eraser.  Always use sunscreen. Apply sunscreen liberally and repeatedly throughout the day.  Protect  yourself by wearing long sleeves, pants, a wide-brimmed hat, and sunglasses whenever you are outside.  Heart disease, diabetes, and high blood pressure  High blood pressure causes heart disease and increases the risk of stroke. High blood pressure is more likely to develop in: ? People who have blood pressure in the high end of the normal range (130-139/85-89 mm Hg). ? People who are overweight or obese. ? People who are African American.  If you are 76-29 years of age, have your blood pressure checked every 3-5 years. If you are 55 years of age or older, have your blood pressure checked every year. You should have your blood pressure measured twice-once when you are at a hospital or clinic, and once when you are not at a hospital or clinic. Record the average of the two measurements. To check your blood pressure when you are not at a hospital or clinic, you can use: ? An automated blood pressure machine at a pharmacy. ? A home blood pressure monitor.  If  you are between 64 years and 74 years old, ask your health care provider if you should take aspirin to prevent strokes.  Have regular diabetes screenings. This involves taking a blood sample to check your fasting blood sugar level. ? If you are at a normal weight and have a low risk for diabetes, have this test once every three years after 72 years of age. ? If you are overweight and have a high risk for diabetes, consider being tested at a younger age or more often. Preventing infection Hepatitis B  If you have a higher risk for hepatitis B, you should be screened for this virus. You are considered at high risk for hepatitis B if: ? You were born in a country where hepatitis B is common. Ask your health care provider which countries are considered high risk. ? Your parents were born in a high-risk country, and you have not been immunized against hepatitis B (hepatitis B vaccine). ? You have HIV or AIDS. ? You use needles to inject street drugs. ? You live with someone who has hepatitis B. ? You have had sex with someone who has hepatitis B. ? You get hemodialysis treatment. ? You take certain medicines for conditions, including cancer, organ transplantation, and autoimmune conditions.  Hepatitis C  Blood testing is recommended for: ? Everyone born from [birth year] through 1965. ? Anyone with known risk factors for hepatitis C.  Sexually transmitted infections (STIs)  You should be screened for sexually transmitted infections (STIs) including gonorrhea and chlamydia if: ? You are sexually active and are younger than 72 years of age. ? You are older than 72 years of age and your health care provider tells you that you are at risk for this type of infection. ? Your sexual activity has changed since you were last screened and you are at an increased risk for chlamydia or gonorrhea. Ask your health care provider if you are at risk.  If you do not have HIV, but are at risk, it may be recommended  that you take a prescription medicine daily to prevent HIV infection. This is called pre-exposure prophylaxis (PrEP). You are considered at risk if: ? You are sexually active and do not regularly use condoms or know the HIV status of your partner(s). ? You take drugs by injection. ? You are sexually active with a partner who has HIV.  Talk with your health care provider about whether you are at high risk of being  infected with HIV. If you choose to begin PrEP, you should first be tested for HIV. You should then be tested every 3 months for as long as you are taking PrEP. Pregnancy  If you are premenopausal and you may become pregnant, ask your health care provider about preconception counseling.  If you may become pregnant, take 400 to 800 micrograms (mcg) of folic acid every day.  If you want to prevent pregnancy, talk to your health care provider about birth control (contraception). Osteoporosis and menopause  Osteoporosis is a disease in which the bones lose minerals and strength with aging. This can result in serious bone fractures. Your risk for osteoporosis can be identified using a bone density scan.  If you are 68 years of age or older, or if you are at risk for osteoporosis and fractures, ask your health care provider if you should be screened.  Ask your health care provider whether you should take a calcium or vitamin D supplement to lower your risk for osteoporosis.  Menopause may have certain physical symptoms and risks.  Hormone replacement therapy may reduce some of these symptoms and risks. Talk to your health care provider about whether hormone replacement therapy is right for you. Follow these instructions at home:  Schedule regular health, dental, and eye exams.  Stay current with your immunizations.  Do not use any tobacco products including cigarettes, chewing tobacco, or electronic cigarettes.  If you are pregnant, do not drink alcohol.  If you are  breastfeeding, limit how much and how often you drink alcohol.  Limit alcohol intake to no more than 1 drink per day for nonpregnant women. One drink equals 12 ounces of beer, 5 ounces of wine, or 1 ounces of hard liquor.  Do not use street drugs.  Do not share needles.  Ask your health care provider for help if you need support or information about quitting drugs.  Tell your health care provider if you often feel depressed.  Tell your health care provider if you have ever been abused or do not feel safe at home. This information is not intended to replace advice given to you by your health care provider. Make sure you discuss any questions you have with your health care provider. Document Released: 10/25/2010 Document Revised: 09/17/2015 Document Reviewed: 01/13/2015 Elsevier Interactive Patient Education  [employer].

## 2017-06-05 NOTE — Assessment & Plan Note (Addendum)
Will update [place] - will be due next month.

## 2017-06-05 NOTE — Assessment & Plan Note (Addendum)
Chronic, reviewed A1c above goal.

## 2017-06-05 NOTE — Assessment & Plan Note (Signed)
Rpt levels stable.

## 2017-06-05 NOTE — Progress Notes (Addendum)
BP 134/78 (BP Location: Left Arm, Patient Position: Sitting, Cuff Size: Normal)   Pulse 96   Temp 98.1 F (36.7 C) (Oral)   Ht 5\' 1"  (1.549 m)   Wt 139 lb 8 oz (63.3 kg)   SpO2 94%   BMI 26.36 kg/m    CC: CPE Subjective:    Patient ID: Cheryl Bird, female    DOB: Aug 17, 1945, 72 y.o.   MRN: [id number]  HPI: Cheryl Bird is a 72 y.o. female presenting on 06/05/2017 for Annual Exam (Pt 2.)   Saw Katha Cabal last week for medicare wellness visit. Note reviewed. Failed hearing screen. Declines audiology referral - has previously seen ENT. Has had mechanical falls due to poor weather. Fortunately no injury sustained. Incontinence symptoms have improved - just using oxybutynin PRN. Stools are normal.   Preventative: COLONOSCOPY WITH PROPOFOL 05/30/2016; TAx3, rpt 3 yrs Lollie Sails, MD) ESOPHAGOGASTRODUODENOSCOPY 11/2015; chronic gastritis, reflux esophagitis, + barrett's, + H pylori, rpt 2 yrs Gustavo Lah) Mammo - 05/2016 WNL - rpt scheduled Lung cancer screening - undergoing screening started 2018 Last pap smear - 11/2011 WNL decided to aged out. Last pelvic exam 2018. DEXA 05/2015 osteopenia - T -1.4 femur, forearm -0.7. Walks daily. Calcium in diet.  Flu shot yearly Td 11/2011 Pneumovax 8/32013, prevnar 12/2013 shingrix - declined Advanced directives: full code. Would want HCPOA to be daughter. Son in Sports coach is Therapist, sports of finances. Wants trial of life support if deemed reversible. Otherwise doesn't want prolonged life support. Has spoken with daughter/son in law about this. Has advanced directive packet at home.  Seat belt use discussed.  Sunscreen use discussed. No changing moles on skin.  Current smoker <1 pack per week - continuing to cut down.  Alcohol - none  Upcoming dentist appt.   Caffeine: 2 cups coffee/day  Grew up in [place] Occupation: retired  Edu: HS  Activity: walks daily 61min  Diet: good water, fruits/vegetables daily   Relevant past medical, surgical,  family and social history reviewed and updated as indicated. Interim medical history since our last visit reviewed. Allergies and medications reviewed and updated. Outpatient Medications Prior to Visit  Medication Sig Dispense Refill  . ACCU-CHEK SOFTCLIX LANCETS lancets Use to check sugar once daily and as needed Dx: E11.9 100 each 3  . albuterol (PROVENTIL HFA;VENTOLIN HFA) 108 (90 BASE) MCG/ACT inhaler Inhale 2 puffs into the lungs every 6 (six) hours as needed for wheezing. 1 Inhaler 3  . Alcohol Swabs PADS Use to clean finger prior to testing blood sugar. Dx: E11.9 100 each 3  . Calcium Carbonate-Vitamin D (CALCIUM 600/VITAMIN D) 600-400 MG-UNIT chew tablet Chew 1 tablet by mouth daily.    Marland Kitchen glucose blood (ACCU-CHEK SMARTVIEW) test strip 1 each by Other route as directed. Use as instructed to check blood sugar once daily and as needed. Dx: E11.9 **NANO SMARTVIEW** 100 each 3  . oxybutynin (DITROPAN) 5 MG tablet TAKE 1 TABLET (5 MG TOTAL) BY MOUTH 2 (TWO) TIMES DAILY AS NEEDED (INCONTINENCE). 60 tablet 6  . atorvastatin (LIPITOR) 20 MG tablet TAKE 1 TABLET (20 MG TOTAL) BY MOUTH NIGHTLY 90 tablet 3  . Cholecalciferol (VITAMIN D3) 1000 units CAPS Take 1 capsule (1,000 Units total) by mouth daily.    Marland Kitchen lisinopril (PRINIVIL,ZESTRIL) 20 MG tablet TAKE 1 TABLET (20 MG TOTAL) BY MOUTH DAILY. 90 tablet 3  . metFORMIN (GLUCOPHAGE) 1000 MG tablet TAKE 1 TABLET BY MOUTH TWICE A DAY WITH A MEAL 180 tablet 0  . pantoprazole (  PROTONIX) 40 MG tablet Take 40 mg by mouth daily.    . sitaGLIPtin (JANUVIA) 50 MG tablet TAKE 1 TABLET (50 MG TOTAL) BY MOUTH DAILY. 90 tablet 3   No facility-administered medications prior to visit.      Per HPI unless specifically indicated in ROS section below Review of Systems  Constitutional: Negative for activity change, appetite change, chills, fatigue, fever and unexpected weight change.  HENT: Negative for hearing loss.   Eyes: Negative for visual disturbance.    Respiratory: Negative for cough, chest tightness, shortness of breath and wheezing.   Cardiovascular: Negative for chest pain, palpitations and leg swelling.  Gastrointestinal: Negative for abdominal distention, abdominal pain, blood in stool, constipation, diarrhea, nausea and vomiting.  Genitourinary: Negative for difficulty urinating and hematuria.  Musculoskeletal: Negative for arthralgias, myalgias and neck pain.  Skin: Negative for rash.  Neurological: Negative for dizziness, seizures, syncope and headaches.  Hematological: Negative for adenopathy. Does not bruise/bleed easily.  Psychiatric/Behavioral: Negative for dysphoric mood. The patient is not nervous/anxious.        Objective:    BP 134/78 (BP Location: Left Arm, Patient Position: Sitting, Cuff Size: Normal)   Pulse 96   Temp 98.1 F (36.7 C) (Oral)   Ht 5\' 1"  (1.549 m)   Wt 139 lb 8 oz (63.3 kg)   SpO2 94%   BMI 26.36 kg/m   Wt Readings from Last 3 Encounters:  06/05/17 139 lb 8 oz (63.3 kg)  05/31/17 138 lb 4 oz (62.7 kg)  12/07/16 140 lb (63.5 kg)    Physical Exam  Constitutional: She is oriented to person, place, and time. She appears well-developed and well-nourished. No distress.  HENT:  Head: Normocephalic and atraumatic.  Right Ear: Hearing, tympanic membrane, external ear and ear canal normal.  Left Ear: Hearing, tympanic membrane, external ear and ear canal normal.  Nose: Nose normal.  Mouth/Throat: Uvula is midline, oropharynx is clear and moist and mucous membranes are normal. No oropharyngeal exudate, posterior oropharyngeal edema or posterior oropharyngeal erythema.  Eyes: Conjunctivae and EOM are normal. Pupils are equal, round, and reactive to light. No scleral icterus.  Neck: Normal range of motion. Neck supple. No thyromegaly present.  Cardiovascular: Normal rate, regular rhythm, normal heart sounds and intact distal pulses.  No murmur heard. Pulses:      Radial pulses are 2+ on the right  side, and 2+ on the left side.  Pulmonary/Chest: Effort normal and breath sounds normal. No respiratory distress. She has no wheezes. She has no rales.  Abdominal: Soft. Bowel sounds are normal. She exhibits no distension and no mass. There is no tenderness. There is no rebound and no guarding.  Musculoskeletal: Normal range of motion. She exhibits no edema.  Lymphadenopathy:    She has no cervical adenopathy.  Neurological: She is alert and oriented to person, place, and time.  CN grossly intact, station and gait intact  Skin: Skin is warm and dry. No rash noted.  Psychiatric: She has a normal mood and affect. Her behavior is normal. Judgment and thought content normal.  Nursing note and vitals reviewed.  Results for orders placed or performed in visit on 05/31/17  T4, free  Result Value Ref Range   Free T4 0.91 0.60 - 1.60 ng/dL  VITAMIN D 25 Hydroxy (Vit-D Deficiency, Fractures)  Result Value Ref Range   VITD 20.68 (L) 30.00 - 100.00 ng/mL  TSH  Result Value Ref Range   TSH 4.05 0.35 - 4.50 uIU/mL  Hemoglobin  A1c  Result Value Ref Range   Hgb A1c MFr Bld 7.3 (H) 4.6 - 6.5 %  Comprehensive metabolic panel  Result Value Ref Range   Sodium 140 135 - 145 mEq/L   Potassium 5.0 3.5 - 5.1 mEq/L   Chloride 106 96 - 112 mEq/L   CO2 28 19 - 32 mEq/L   Glucose, Bld 137 (H) 70 - 99 mg/dL   BUN 21 6 - 23 mg/dL   Creatinine, Ser 1.07 0.40 - 1.20 mg/dL   Total Bilirubin 0.4 0.2 - 1.2 mg/dL   Alkaline Phosphatase 86 39 - 117 U/L   AST 77 (H) 0 - 37 U/L   ALT 78 (H) 0 - 35 U/L   Total Protein 6.9 6.0 - 8.3 g/dL   Albumin 4.0 3.5 - 5.2 g/dL   Calcium 9.4 8.4 - 10.5 mg/dL   GFR 53.62 (L) >60.00 mL/min  Lipid panel  Result Value Ref Range   Cholesterol 137 0 - 200 mg/dL   Triglycerides 74.0 0.0 - 149.0 mg/dL   HDL 53.40 >39.00 mg/dL   VLDL 14.8 0.0 - 40.0 mg/dL   LDL Cholesterol 69 0 - 99 mg/dL   Total CHOL/HDL Ratio 3    NonHDL 83.56       Assessment & Plan:   Problem List  Items Addressed This Visit    Abnormal TSH    Rpt levels stable.       Advanced care planning/counseling discussion    Advanced directives: full code. Would want HCPOA to be daughter. Son in Sports coach is Therapist, sports of finances. Wants trial of life support if deemed reversible. Otherwise doesn't want prolonged life support. Has spoken with daughter/son in law about this. Has advanced directive packet at home.       Background diabetic retinopathy ([place])   Relevant Medications   metFORMIN (GLUCOPHAGE) 1000 MG tablet   lisinopril (PRINIVIL,ZESTRIL) 20 MG tablet   sitaGLIPtin (JANUVIA) 50 MG tablet   atorvastatin (LIPITOR) 20 MG tablet   Barrett esophagus    Continue PPI daily. Will be due for EGD this summer.       CAD (coronary artery disease)    By CT - continue aspirin, statin.       Relevant Medications   lisinopril (PRINIVIL,ZESTRIL) 20 MG tablet   atorvastatin (LIPITOR) 20 MG tablet   Carotid stenosis    Will update [place] - will be due next month.       Relevant Medications   lisinopril (PRINIVIL,ZESTRIL) 20 MG tablet   atorvastatin (LIPITOR) 20 MG tablet   Other Relevant Orders   VAS US CAROTID   COPD, moderate (HCC)    Stable off controller medication, albuterol rarely PRN. Continued smoker      Diabetes mellitus type 2, uncontrolled, with complications (HCC)    Chronic, reviewed A1c above goal.       Relevant Medications   metFORMIN (GLUCOPHAGE) 1000 MG tablet   lisinopril (PRINIVIL,ZESTRIL) 20 MG tablet   sitaGLIPtin (JANUVIA) 50 MG tablet   atorvastatin (LIPITOR) 20 MG tablet   GERD (gastroesophageal reflux disease)    Continue daily PPI      Relevant Medications   pantoprazole (PROTONIX) 40 MG tablet   Health maintenance examination - Primary    Preventative protocols reviewed and updated unless pt declined. Discussed healthy diet and lifestyle.       HLD (hyperlipidemia)    Chronic, stable on statin.  The 10-year ASCVD risk score Mikey Bussing DC Brooke Bonito., et al., 2013)  is: 37.4%  Values used to calculate the score:     Age: 70 years     Sex: Female     Is Non-Hispanic African American: No     Diabetic: Yes     Tobacco smoker: Yes     Systolic Blood Pressure: 884 mmHg     Is BP treated: Yes     HDL Cholesterol: 53.4 mg/dL     Total Cholesterol: 137 mg/dL       Relevant Medications   lisinopril (PRINIVIL,ZESTRIL) 20 MG tablet   atorvastatin (LIPITOR) 20 MG tablet   HTN (hypertension)    Chronic, stable. Continue current regimen       Relevant Medications   lisinopril (PRINIVIL,ZESTRIL) 20 MG tablet   atorvastatin (LIPITOR) 20 MG tablet   Osteopenia    Reviewed cal/vit D dosing.  rec 1 glass milk daily.       Renal insufficiency    Will continue to monitor.       Smoker    Chronic, in action phase - actively cutting down. <1 pack per week      Thoracic aortic atherosclerosis ([place])    By CT - continue aspirin, statin.      Relevant Medications   lisinopril (PRINIVIL,ZESTRIL) 20 MG tablet   atorvastatin (LIPITOR) 20 MG tablet   Transaminitis    New - will continue to monitor.           Follow up plan: Return in about 6 months (around 12/03/2017) for follow up visit.  Ria Bush, MD

## 2017-06-16 ENCOUNTER — Ambulatory Visit
Admission: RE | Admit: 2017-06-16 | Discharge: 2017-06-16 | Disposition: A | Discharge status: Home / Self Care | Payer: Medicare Other | Source: Ambulatory Visit | Attending: Family Medicine | Admitting: Family Medicine

## 2017-06-16 DIAGNOSIS — Z1231 Encounter for screening mammogram for malignant neoplasm of breast: Secondary | ICD-10-CM | POA: Insufficient documentation

## 2017-06-16 LAB — HM MAMMOGRAPHY

## 2017-06-19 ENCOUNTER — Encounter: Payer: Self-pay | Admitting: Family Medicine

## 2017-06-26 ENCOUNTER — Ambulatory Visit (INDEPENDENT_AMBULATORY_CARE_PROVIDER_SITE_OTHER): Payer: Medicare Other

## 2017-06-26 DIAGNOSIS — I6523 Occlusion and stenosis of bilateral carotid arteries: Secondary | ICD-10-CM | POA: Diagnosis not present

## 2017-08-25 ENCOUNTER — Emergency Department (HOSPITAL_COMMUNITY): Payer: Medicare Other

## 2017-08-25 ENCOUNTER — Encounter (HOSPITAL_COMMUNITY): Payer: Self-pay | Admitting: Emergency Medicine

## 2017-08-25 ENCOUNTER — Other Ambulatory Visit: Payer: Self-pay

## 2017-08-25 ENCOUNTER — Emergency Department (HOSPITAL_COMMUNITY)
Admission: EM | Admit: 2017-08-25 | Discharge: 2017-08-25 | Disposition: A | Discharge status: Home / Self Care | Payer: Medicare Other | Attending: Emergency Medicine | Admitting: Emergency Medicine

## 2017-08-25 DIAGNOSIS — F17210 Nicotine dependence, cigarettes, uncomplicated: Secondary | ICD-10-CM | POA: Diagnosis not present

## 2017-08-25 DIAGNOSIS — G459 Transient cerebral ischemic attack, unspecified: Secondary | ICD-10-CM | POA: Insufficient documentation

## 2017-08-25 DIAGNOSIS — J449 Chronic obstructive pulmonary disease, unspecified: Secondary | ICD-10-CM | POA: Insufficient documentation

## 2017-08-25 DIAGNOSIS — Z7984 Long term (current) use of oral hypoglycemic drugs: Secondary | ICD-10-CM | POA: Insufficient documentation

## 2017-08-25 DIAGNOSIS — E860 Dehydration: Secondary | ICD-10-CM | POA: Diagnosis not present

## 2017-08-25 DIAGNOSIS — I129 Hypertensive chronic kidney disease with stage 1 through stage 4 chronic kidney disease, or unspecified chronic kidney disease: Secondary | ICD-10-CM | POA: Insufficient documentation

## 2017-08-25 DIAGNOSIS — Z79899 Other long term (current) drug therapy: Secondary | ICD-10-CM | POA: Insufficient documentation

## 2017-08-25 DIAGNOSIS — R29818 Other symptoms and signs involving the nervous system: Secondary | ICD-10-CM | POA: Diagnosis not present

## 2017-08-25 DIAGNOSIS — I4510 Unspecified right bundle-branch block: Secondary | ICD-10-CM | POA: Diagnosis not present

## 2017-08-25 DIAGNOSIS — E119 Type 2 diabetes mellitus without complications: Secondary | ICD-10-CM | POA: Diagnosis not present

## 2017-08-25 DIAGNOSIS — R0600 Dyspnea, unspecified: Secondary | ICD-10-CM | POA: Diagnosis not present

## 2017-08-25 DIAGNOSIS — R531 Weakness: Secondary | ICD-10-CM | POA: Diagnosis present

## 2017-08-25 DIAGNOSIS — R29810 Facial weakness: Secondary | ICD-10-CM | POA: Diagnosis not present

## 2017-08-25 DIAGNOSIS — N189 Chronic kidney disease, unspecified: Secondary | ICD-10-CM | POA: Diagnosis not present

## 2017-08-25 LAB — URINALYSIS, ROUTINE W REFLEX MICROSCOPIC
BILIRUBIN URINE: NEGATIVE
GLUCOSE, UA: NEGATIVE mg/dL
Hgb urine dipstick: NEGATIVE
Ketones, ur: NEGATIVE mg/dL
Leukocytes, UA: NEGATIVE
NITRITE: NEGATIVE
PH: 6 (ref 5.0–8.0)
Protein, ur: NEGATIVE mg/dL
SPECIFIC GRAVITY, URINE: 1.005 (ref 1.005–1.030)

## 2017-08-25 LAB — I-STAT TROPONIN, ED: TROPONIN I, POC: 0.02 ng/mL (ref 0.00–0.08)

## 2017-08-25 LAB — CBC
HCT: 34.9 % — ABNORMAL LOW (ref 36.0–46.0)
HEMOGLOBIN: 11.2 g/dL — AB (ref 12.0–15.0)
MCH: 28.6 pg (ref 26.0–34.0)
MCHC: 32.1 g/dL (ref 30.0–36.0)
MCV: 89 fL (ref 78.0–100.0)
Platelets: 193 10*3/uL (ref 150–400)
RBC: 3.92 MIL/uL (ref 3.87–5.11)
RDW: 13.7 % (ref 11.5–15.5)
WBC: 6.7 10*3/uL (ref 4.0–10.5)

## 2017-08-25 LAB — PROTIME-INR
INR: 1
PROTHROMBIN TIME: 13.1 s (ref 11.4–15.2)

## 2017-08-25 LAB — COMPREHENSIVE METABOLIC PANEL
ALBUMIN: 3.4 g/dL — AB (ref 3.5–5.0)
ALK PHOS: 67 U/L (ref 38–126)
ALT: 25 U/L (ref 14–54)
ANION GAP: 10 (ref 5–15)
AST: 39 U/L (ref 15–41)
BILIRUBIN TOTAL: 0.6 mg/dL (ref 0.3–1.2)
BUN: 23 mg/dL — ABNORMAL HIGH (ref 6–20)
CALCIUM: 8.8 mg/dL — AB (ref 8.9–10.3)
CO2: 22 mmol/L (ref 22–32)
Chloride: 108 mmol/L (ref 101–111)
Creatinine, Ser: 1.55 mg/dL — ABNORMAL HIGH (ref 0.44–1.00)
GFR, EST AFRICAN AMERICAN: 38 mL/min — AB (ref >60)
GFR, EST NON AFRICAN AMERICAN: 33 mL/min — AB (ref >60)
Glucose, Bld: 136 mg/dL — ABNORMAL HIGH (ref 65–99)
POTASSIUM: 5.1 mmol/L (ref 3.5–5.1)
Sodium: 140 mmol/L (ref 135–145)
TOTAL PROTEIN: 6.3 g/dL — AB (ref 6.5–8.1)

## 2017-08-25 LAB — APTT: APTT: 29 s (ref 24–36)

## 2017-08-25 LAB — DIFFERENTIAL
Basophils Absolute: 0 10*3/uL (ref 0.0–0.1)
Basophils Relative: 0 %
EOS ABS: 0.3 10*3/uL (ref 0.0–0.7)
Eosinophils Relative: 4 %
LYMPHS ABS: 1.8 10*3/uL (ref 0.7–4.0)
LYMPHS PCT: 26 %
Monocytes Absolute: 0.5 10*3/uL (ref 0.1–1.0)
Monocytes Relative: 8 %
NEUTROS ABS: 4.1 10*3/uL (ref 1.7–7.7)
NEUTROS PCT: 62 %

## 2017-08-25 MED ORDER — SODIUM CHLORIDE 0.9 % IV BOLUS
1000.0000 mL | Freq: Once | INTRAVENOUS | Status: AC
  Administered 2017-08-25: 1000 mL via INTRAVENOUS

## 2017-08-25 NOTE — ED Provider Notes (Signed)
[facility] EMERGENCY DEPARTMENT Provider Note   CSN: [id number] Arrival date & time: 08/25/17  1532     History   Chief Complaint Chief Complaint  Patient presents with  . Transient Ischemic Attack    HPI Cheryl Bird is a 72 y.o. female who presents with possible TIA. PMH significant for HTN, HLD, DM, COPD, tobacco use, GERD. She states that she went to a neighbor's house to pay him for cutting her lawn and felt very hot. She states that she felt weak all over. When her neighbor saw her he noted that the right side of her face was drooping and he thought she was weaker on the right side. She wanted to go home because it was time for her nap and she felt that if she could lie down and cool off she would feel better however her neighbor called EMS. Her symptoms had resolved when they arrived but they did note her BP was >989 systolic and she was encouraged to come to the ED. She feels better now. She denies headache, dizziness, weakness, vision changes, numbness or tingling. She has never had a TIA or CVA. She states she had a recent carotid doppler in march which did not show significant stenosis and actually looked improved from prior. Carotid US BICA 1-39% stenosis, ECA >50% bilaterally, vertebrals patent  HPI  Past Medical History:  Diagnosis Date  . Adenomatous rectal polyp 09/2004   Nicolasa Ducking)  . Background diabetic retinopathy ([place]) 01/2016  . Barrett esophagus 02/2012   on EGD biopsy Gustavo Lah)  . Carotid stenosis 06/04/2016   40-59% RICA stenosis. 2-11% LICA stenosis. Repeat yearly (06/2016)  . COPD, moderate ([place]) 02/12/2013   Spirometry - 01/2013: FEV1 = 59%, post alb 78%, ratio 0.6, mod severe obstruction, good response to bronchodilator   . Diabetes type 2, controlled ([place])    DMSE @ [facility] 11/2011  . Diverticulosis 09/2004   by colonoscopy  . Ex-smoker 01/2013  . GERD (gastroesophageal reflux disease)   . History of cleft palate   . HLD (hyperlipidemia)    . HTN (hypertension)    dx per prior records  . Osteopenia 05/2015   T -1.4 femur, foreamr -0.7  . Right-sided sensorineural hearing loss    from infection as child, hsa been seen by Dr. Pryor Ochoa    Patient Active Problem List   Diagnosis Date Noted  . Transaminitis 06/05/2017  . Renal insufficiency 06/05/2017  . CAD (coronary artery disease) 12/10/2016  . Thoracic aortic atherosclerosis ([place]) 12/10/2016  . Carotid stenosis 06/04/2016  . Background diabetic retinopathy ([place]) 02/01/2016  . Urinary incontinence, mixed 11/30/2015  . Health maintenance examination 06/02/2015  . Osteopenia 05/27/2015  . Advanced care planning/counseling discussion 05/28/2014  . Abnormal TSH 05/28/2014  . Rhinorrhea 01/09/2014  . COPD, moderate ([place]) 02/12/2013  . HTN (hypertension)   . GERD (gastroesophageal reflux disease)   . Barrett esophagus 02/24/2012  . Medicare annual wellness visit, subsequent 12/24/2011  . Diabetes mellitus type 2, uncontrolled, with complications ([place])   . HLD (hyperlipidemia)   . Smoker     Past Surgical History:  Procedure Laterality Date  . BLADDER SUSPENSION    . CATARACT EXTRACTION  2013   Right  . COLONOSCOPY  10/08/2004   rectal polyp (tubular adenoma), diverticulosis - Dr. Nicolasa Ducking  . COLONOSCOPY  02/2012   multiple polyps - tubular and sessile adenomas, diverticulosis, decreased rectal tone ([place]) rec rpt 3 yrs  . COLONOSCOPY  2017   stool present,  to reschedule Gustavo Lah)  . COLONOSCOPY WITH PROPOFOL N/A 12/21/2015   Procedure: COLONOSCOPY WITH PROPOFOL;  Surgeon: Lollie Sails, MD;  [facility] ENDOSCOPY;  Service: Endoscopy;  Laterality: N/A;  . COLONOSCOPY WITH PROPOFOL N/A 05/30/2016   TAx3, rpt 3 yrs Lollie Sails, MD)  . ESOPHAGOGASTRODUODENOSCOPY  02/2012   irregular z line (+barretts), normal stomach and duodenum, rpt EGD 1 yr  . ESOPHAGOGASTRODUODENOSCOPY  05/2013   irregular z line (+barretts), HH, reflux gastroesophagitis, rpt 2 yrs    . ESOPHAGOGASTRODUODENOSCOPY  11/2015   chronic gastritis, reflux esophagitis, + barrett's, + H pylori, rpt 2 yrs ([place])  . ESOPHAGOGASTRODUODENOSCOPY (EGD) WITH PROPOFOL N/A 12/21/2015   Procedure: ESOPHAGOGASTRODUODENOSCOPY (EGD) WITH PROPOFOL;  Surgeon: Lollie Sails, MD;  Location[facility] ENDOSCOPY;  Service: Endoscopy;  Laterality: N/A;  . EYE SURGERY    . TONSILLECTOMY AND ADENOIDECTOMY    . TUBAL LIGATION       OB History   None      Home Medications    Prior to Admission medications   Medication Sig Start Date End Date Taking? Authorizing Provider  ACCU-CHEK SOFTCLIX LANCETS lancets Use to check sugar once daily and as needed Dx: E11.9 08/04/15   Ria Bush, MD  albuterol (PROVENTIL HFA;VENTOLIN HFA) 108 (90 BASE) MCG/ACT inhaler Inhale 2 puffs into the lungs every 6 (six) hours as needed for wheezing. 02/11/13   Ria Bush, MD  Alcohol Swabs PADS Use to clean finger prior to testing blood sugar. Dx: E11.9 08/04/15   Ria Bush, MD  atorvastatin (LIPITOR) 20 MG tablet TAKE 1 TABLET (20 MG TOTAL) BY MOUTH NIGHTLY 06/05/17   Ria Bush, MD  Calcium Carbonate-Vitamin D (CALCIUM 600/VITAMIN D) 600-400 MG-UNIT chew tablet Chew 1 tablet by mouth daily. 06/26/15   Ria Bush, MD  Cholecalciferol (VITAMIN D3) 1000 units CAPS Take 2 capsules (2,000 Units total) by mouth daily. 06/05/17   Ria Bush, MD  glucose blood [facility]) test strip 1 each by Other route as directed. Use as instructed to check blood sugar once daily and as needed. Dx: E11.9 **NANO SMARTVIEW** 02/04/15   Ria Bush, MD  lisinopril (PRINIVIL,ZESTRIL) 20 MG tablet TAKE 1 TABLET (20 MG TOTAL) BY MOUTH DAILY. 06/05/17   Ria Bush, MD  metFORMIN (GLUCOPHAGE) 1000 MG tablet Take 1 tablet (1,000 mg total) by mouth 2 (two) times daily with a meal. 06/05/17   Ria Bush, MD  oxybutynin (DITROPAN) 5 MG tablet TAKE 1 TABLET (5 MG TOTAL) BY MOUTH 2 (TWO)  TIMES DAILY AS NEEDED (INCONTINENCE). 05/09/16   Ria Bush, MD  pantoprazole (PROTONIX) 40 MG tablet Take 1 tablet (40 mg total) by mouth daily. 06/05/17   Ria Bush, MD  sitaGLIPtin (JANUVIA) 50 MG tablet TAKE 1 TABLET (50 MG TOTAL) BY MOUTH DAILY. 06/05/17   Ria Bush, MD    Family History Family History  Problem Relation Age of Onset  . Diabetes Maternal Aunt   . Alcohol abuse Mother   . Heart disease Mother        enlarged heart  . Kidney failure Mother   . Hypertension Mother   . Coronary artery disease Father 23  . Coronary artery disease Brother 57  . Cancer Sister        kidney   . Stroke Neg Hx   . Breast cancer Neg Hx     Social History Social History   Tobacco Use  . Smoking status: Current Some Day Smoker    Packs/day: 1.00  Years: 56.00    Pack years: 56.00    Types: Cigarettes    Start date: 04/25/1968  . Smokeless tobacco: Never Used  . Tobacco comment: smokes 1 pack week currently  Substance Use Topics  . Alcohol use: No  . Drug use: No     Allergies   Aleve [naproxen sodium]   Review of Systems Review of Systems  Constitutional: Negative for fever.  Eyes: Negative for visual disturbance.  Respiratory: Negative for shortness of breath.   Cardiovascular: Negative for chest pain.  Gastrointestinal: Negative for abdominal pain.  Neurological: Positive for facial asymmetry (resolved) and weakness (generalized). Negative for dizziness, syncope, numbness and headaches.  All other systems reviewed and are negative.    Physical Exam Updated Vital Signs BP (!) 187/86   Pulse 90   Temp 97.9 F (36.6 C) (Oral)   Resp 18   SpO2 95%   Physical Exam  Constitutional: She is oriented to person, place, and time. She appears well-developed and well-nourished. No distress.  HENT:  Head: Normocephalic and atraumatic.  Eyes: Pupils are equal, round, and reactive to light. Conjunctivae are normal. Right eye exhibits no discharge.  Left eye exhibits no discharge. No scleral icterus.  Neck: Normal range of motion.  Cardiovascular: Normal rate and regular rhythm.  Murmur (soft murmur over RUSB) heard. +carotid bruit  Pulmonary/Chest: Effort normal and breath sounds normal. No respiratory distress.  Abdominal: She exhibits no distension.  Neurological: She is alert and oriented to person, place, and time.  Lying on stretcher in NAD. GCS 15. Speaks in a clear voice. Cranial nerves II through XII grossly intact. Mild difficulty with EOM but improves when she wears her reading glasses. 5/5 strength in all extremities. Sensation fully intact.  Bilateral finger-to-nose intact. Ambulatory   Skin: Skin is warm and dry.  Psychiatric: She has a normal mood and affect. Her behavior is normal.  Nursing note and vitals reviewed.    ED Treatments / Results  Labs (all labs ordered are listed, but only abnormal results are displayed) Labs Reviewed  CBC - Abnormal; Notable for the following components:      Result Value   Hemoglobin 11.2 (*)    HCT 34.9 (*)    All other components within normal limits  COMPREHENSIVE METABOLIC PANEL - Abnormal; Notable for the following components:   Glucose, Bld 136 (*)    BUN 23 (*)    Creatinine, Ser 1.55 (*)    Calcium 8.8 (*)    Total Protein 6.3 (*)    Albumin 3.4 (*)    GFR calc non Af Amer 33 (*)    GFR calc Af Amer 38 (*)    All other components within normal limits  URINALYSIS, ROUTINE W REFLEX MICROSCOPIC - Abnormal; Notable for the following components:   Color, Urine STRAW (*)    All other components within normal limits  PROTIME-INR  APTT  DIFFERENTIAL  I-STAT TROPONIN, ED    EKG EKG Interpretation  Date/Time:  Friday Aug 25 2017 19:52:40 EDT Ventricular Rate:  88 PR Interval:    QRS Duration: 140 QT Interval:  408 QTC Calculation: 494 R Axis:   -61 Text Interpretation:  Sinus rhythm Right bundle branch block LVH with IVCD and secondary repol abnrm Confirmed by  Virgel Manifold [phone]) on 08/25/2017 7:59:03 PM   Radiology Ct Head Wo Contrast  Result Date: 08/25/2017 CLINICAL DATA:  Focal neuro deficit. EXAM: CT HEAD WITHOUT CONTRAST TECHNIQUE: Contiguous axial images were obtained from the base  of the skull through the vertex without intravenous contrast. COMPARISON:  None. FINDINGS: Brain: Mild atrophy. Mild hypointensity throughout the white matter appears chronic. No acute infarct, hemorrhage, or mass. Mild asymmetry of the ventricles larger on the right than the left. No prior studies available but this could be a chronic finding. Empty sella. Vascular: Atherosclerotic disease.  Negative for hyperdense vessel Skull: Negative Sinuses/Orbits: Mucosal edema paranasal sinuses. Left mastoid effusion. Chronic mastoiditis on the right. Right cataract surgery. Other: None IMPRESSION: No acute intracranial abnormality. Chronic appearing microvascular ischemia in the white matter Electronically Signed   By: Franchot Gallo M.D.   On: 08/25/2017 16:32   Mr Brain Wo Contrast  Result Date: 08/25/2017 CLINICAL DATA:  72 y/o F; right-sided facial droop and right-sided weakness. EXAM: MRI HEAD WITHOUT CONTRAST TECHNIQUE: Multiplanar, multiecho pulse sequences of the brain and surrounding structures were obtained without intravenous contrast. COMPARISON:  08/25/2017 CT head FINDINGS: Brain: No acute infarction, hemorrhage, hydrocephalus, extra-axial collection or mass lesion. Partially empty sella turcica. Fewnonspecific foci of T2 FLAIR hyperintense signal abnormality in periventricular white matter are compatible withmildchronic microvascular ischemic changes for age. Mildbrain parenchymal volume loss. Vascular: Normal flow voids. Skull and upper cervical spine: Normal marrow signal. Sinuses/Orbits: No significant abnormal signal of the paranasal sinuses. Opacification of left mastoid air cells. Other: None. IMPRESSION: 1. No acute intracranial abnormality identified. 2. Mild  chronic microvascular ischemic changes and parenchymal volume loss of the brain. 3. Left mastoid air cell opacification. Electronically Signed   By: Kristine Garbe M.D.   On: 08/25/2017 22:16    Procedures Procedures (including critical care time)  Medications Ordered in ED Medications  sodium chloride 0.9 % bolus 1,000 mL (0 mLs Intravenous Stopped 08/25/17 2318)     Initial Impression / Assessment and Plan / ED Course  I have reviewed the triage vital signs and the nursing notes.  Pertinent labs & imaging results that were available during my care of the patient were reviewed by me and considered in my medical decision making (see chart for details).  72 year old female presents with acute right sided facial droop and right sided weakness which has resolved on arrival to the ED. She is hypertensive but otherwise vitals are normal. She denies that she felt like she had right sided weakness and reports more generalized weakness and fatigue. She is convinced that her symptoms were due to going from a cold environment to hot. Will initiate stroke work up and reassess.  CBC is remarkable for mild anemia. CMP is remarkable for elevated SCr from baseline. CT head is negative. Will order MRI.   11:33 PM MRI is negative. At this time, she feels back to baseline and does not want admission. I recommended for her to have repeat testing of her kidney function and have an echo scheduled in the next week. Discussed with Dr. Wilson Singer who is in agreement. She verbalized understanding.   Final Clinical Impressions(s) / ED Diagnoses   Final diagnoses:  TIA (transient ischemic attack)  Dehydration    ED Discharge Orders    None       Recardo Evangelist, PA-C 08/26/17 Sande Rives    Virgel Manifold, MD 08/29/17 1042

## 2017-08-25 NOTE — Discharge Instructions (Signed)
Please follow up with your doctor to obtain an echocardiogram

## 2017-08-25 NOTE — ED Notes (Signed)
Pt ambulated to bathroom, had steady gait.

## 2017-08-25 NOTE — ED Triage Notes (Signed)
Pt here from home with c/o TIA symptoms , EMS was called by neighbor for right side facial drop and right side weakness, b/p was high at first by ems but has come down , right side symptoms have resolved now

## 2017-08-28 ENCOUNTER — Telehealth: Payer: Self-pay

## 2017-08-28 NOTE — Telephone Encounter (Signed)
I will see her then  

## 2017-08-28 NOTE — Telephone Encounter (Signed)
Dr. Darnell Level has been contacted by PA from [facility] regarding this patient's ER visit from the weekend.  She will need repeat labs and ECHO referral including further follow up in the next week.  Dr. Darnell Level requests getting patient seen this week, if possible.  Spoke with [place] and agreed to adding patient in hold - hosp f/u slot for tomorrow.  Will forward this to Dr. Glori Bickers along with Dr. Darnell Level staff message with further detail.    Spoke with patient.  She is doing okay but has lots of questions and concerns from her trip to the ER over the weekend.    Patient has been scheduled for her hospital follow up with Dr. Glori Bickers tomorrow, 08/29/17 at 4:15PM.  She is aware to write down her questions and bring with her to appointment, also suggested she may want her son or daughter to accompany her for an extra set of ears.    Patient thanks me for the call today and looks forward to seeing Dr. Glori Bickers tomorrow as Dr. Darnell Level is out of the office until mid-next week.

## 2017-08-29 ENCOUNTER — Encounter: Payer: Self-pay | Admitting: Family Medicine

## 2017-08-29 ENCOUNTER — Ambulatory Visit (INDEPENDENT_AMBULATORY_CARE_PROVIDER_SITE_OTHER): Payer: Medicare Other | Admitting: Family Medicine

## 2017-08-29 VITALS — BP 156/64 | HR 67 | Temp 98.0°F | Ht 62.0 in | Wt 140.8 lb

## 2017-08-29 DIAGNOSIS — N289 Disorder of kidney and ureter, unspecified: Secondary | ICD-10-CM | POA: Diagnosis not present

## 2017-08-29 DIAGNOSIS — R011 Cardiac murmur, unspecified: Secondary | ICD-10-CM | POA: Insufficient documentation

## 2017-08-29 DIAGNOSIS — I10 Essential (primary) hypertension: Secondary | ICD-10-CM

## 2017-08-29 DIAGNOSIS — G459 Transient cerebral ischemic attack, unspecified: Secondary | ICD-10-CM | POA: Diagnosis not present

## 2017-08-29 DIAGNOSIS — F17200 Nicotine dependence, unspecified, uncomplicated: Secondary | ICD-10-CM

## 2017-08-29 DIAGNOSIS — I358 Other nonrheumatic aortic valve disorders: Secondary | ICD-10-CM | POA: Insufficient documentation

## 2017-08-29 MED ORDER — AMLODIPINE BESYLATE 5 MG PO TABS: 5.0000 mg | ORAL_TABLET | Freq: Every day | ORAL | 3 refills | Status: DC

## 2017-08-29 NOTE — Progress Notes (Signed)
Subjective:    Patient ID: Cheryl Bird, female    DOB: [birth year], 72 y.o.   MRN: [id number]  HPI 72 yo pt of Dr Darnell Level with hx of CAD, HTN, copd, and DM as well as renal insufficiency is here for hospital f/u   She presented with possible TIA She became hot all over and felt weak- followed by facial droop that a neighbor noticed  Neighbor called EMS By the time they arriedd her symptoms had resolved by systolic bp was over 440   Last carotid doppler showed BICA 1-39% stenosis , ECA over 50% bilat and vertebral art patent (? If worse in the past)  She was seen in ED on 08/25/17  EKG NSR with RBBB and evidence of LVH with IVCD and secondary repolarization abn  Normal exam with nl MS in ED  Labs: Results for orders placed or performed during the hospital encounter of 08/25/17  Protime-INR  Result Value Ref Range   Prothrombin Time 13.1 11.4 - 15.2 seconds   INR 1.00   APTT  Result Value Ref Range   aPTT 29 24 - 36 seconds  CBC  Result Value Ref Range   WBC 6.7 4.0 - 10.5 K/uL   RBC 3.92 3.87 - 5.11 MIL/uL   Hemoglobin 11.2 (L) 12.0 - 15.0 g/dL   HCT 34.9 (L) 36.0 - 46.0 %   MCV 89.0 78.0 - 100.0 fL   MCH 28.6 26.0 - 34.0 pg   MCHC 32.1 30.0 - 36.0 g/dL   RDW 13.7 11.5 - 15.5 %   Platelets 193 150 - 400 K/uL  Differential  Result Value Ref Range   Neutrophils Relative % 62 %   Neutro Abs 4.1 1.7 - 7.7 K/uL   Lymphocytes Relative 26 %   Lymphs Abs 1.8 0.7 - 4.0 K/uL   Monocytes Relative 8 %   Monocytes Absolute 0.5 0.1 - 1.0 K/uL   Eosinophils Relative 4 %   Eosinophils Absolute 0.3 0.0 - 0.7 K/uL   Basophils Relative 0 %   Basophils Absolute 0.0 0.0 - 0.1 K/uL  Comprehensive metabolic panel  Result Value Ref Range   Sodium 140 135 - 145 mmol/L   Potassium 5.1 3.5 - 5.1 mmol/L   Chloride 108 101 - 111 mmol/L   CO2 22 22 - 32 mmol/L   Glucose, Bld 136 (H) 65 - 99 mg/dL   BUN 23 (H) 6 - 20 mg/dL   Creatinine, Ser 1.55 (H) 0.44 - 1.00 mg/dL   Calcium 8.8 (L) 8.9 -  10.3 mg/dL   Total Protein 6.3 (L) 6.5 - 8.1 g/dL   Albumin 3.4 (L) 3.5 - 5.0 g/dL   AST 39 15 - 41 U/L   ALT 25 14 - 54 U/L   Alkaline Phosphatase 67 38 - 126 U/L   Total Bilirubin 0.6 0.3 - 1.2 mg/dL   GFR calc non Af Amer 33 (L) >60 mL/min   GFR calc Af Amer 38 (L) >60 mL/min   Anion gap 10 5 - 15  Urinalysis, Routine w reflex microscopic  Result Value Ref Range   Color, Urine STRAW (A) YELLOW   APPearance CLEAR CLEAR   Specific Gravity, Urine 1.005 1.005 - 1.030   pH 6.0 5.0 - 8.0   Glucose, UA NEGATIVE NEGATIVE mg/dL   Hgb urine dipstick NEGATIVE NEGATIVE   Bilirubin Urine NEGATIVE NEGATIVE   Ketones, ur NEGATIVE NEGATIVE mg/dL   Protein, ur NEGATIVE NEGATIVE mg/dL   Nitrite NEGATIVE NEGATIVE  Leukocytes, UA NEGATIVE NEGATIVE  I-stat troponin, ED  Result Value Ref Range   Troponin i, poc 0.02 0.00 - 0.08 ng/mL   Comment 3            CT of head and MR brain done Ct Head Wo Contrast  Result Date: 08/25/2017 CLINICAL DATA:  Focal neuro deficit. EXAM: CT HEAD WITHOUT CONTRAST TECHNIQUE: Contiguous axial images were obtained from the base of the skull through the vertex without intravenous contrast. COMPARISON:  None. FINDINGS: Brain: Mild atrophy. Mild hypointensity throughout the white matter appears chronic. No acute infarct, hemorrhage, or mass. Mild asymmetry of the ventricles larger on the right than the left. No prior studies available but this could be a chronic finding. Empty sella. Vascular: Atherosclerotic disease.  Negative for hyperdense vessel Skull: Negative Sinuses/Orbits: Mucosal edema paranasal sinuses. Left mastoid effusion. Chronic mastoiditis on the right. Right cataract surgery. Other: None IMPRESSION: No acute intracranial abnormality. Chronic appearing microvascular ischemia in the white matter Electronically Signed   By: Franchot Gallo M.D.   On: 08/25/2017 16:32   Mr Brain Wo Contrast  Result Date: 08/25/2017 CLINICAL DATA:  72 y/o F; right-sided facial  droop and right-sided weakness. EXAM: MRI HEAD WITHOUT CONTRAST TECHNIQUE: Multiplanar, multiecho pulse sequences of the brain and surrounding structures were obtained without intravenous contrast. COMPARISON:  08/25/2017 CT head FINDINGS: Brain: No acute infarction, hemorrhage, hydrocephalus, extra-axial collection or mass lesion. Partially empty sella turcica. Fewnonspecific foci of T2 FLAIR hyperintense signal abnormality in periventricular white matter are compatible withmildchronic microvascular ischemic changes for age. Mildbrain parenchymal volume loss. Vascular: Normal flow voids. Skull and upper cervical spine: Normal marrow signal. Sinuses/Orbits: No significant abnormal signal of the paranasal sinuses. Opacification of left mastoid air cells. Other: None. IMPRESSION: 1. No acute intracranial abnormality identified. 2. Mild chronic microvascular ischemic changes and parenchymal volume loss of the brain. 3. Left mastoid air cell opacification. Electronically Signed   By: Kristine Garbe M.D.   On: 08/25/2017 22:16    Pt states she had mastoiditis (per her) as a child  Has hearing loss  No ear pain  She has seen ENT in the past - Dr Pryor Ochoa- was told that hearing aide would not help   Final assessment and plan from ED: I have reviewed the triage vital signs and the nursing notes.  Pertinent labs & imaging results that were available during my care of the patient were reviewed by me and considered in my medical decision making (see chart for details).  72 year old female presents with acute right sided facial droop and right sided weakness which has resolved on arrival to the ED. She is hypertensive but otherwise vitals are normal. She denies that she felt like she had right sided weakness and reports more generalized weakness and fatigue. She is convinced that her symptoms were due to going from a cold environment to hot. Will initiate stroke work up and reassess.  CBC is remarkable  for mild anemia. CMP is remarkable for elevated SCr from baseline. CT head is negative. Will order MRI.   11:33 PM MRI is negative. At this time, she feels back to baseline and does not want admission. I recommended for her to have repeat testing of her kidney function and have an echo scheduled in the next week. Discussed with Dr. Wilson Singer who is in agreement. She verbalized understanding.  Final diag: TIA and dehydration    anticoag status   HTN BP Readings from Last 3 Encounters:  08/29/17 Marland Kitchen)  156/64  08/25/17 (!) 156/72  06/05/17 134/78  pt has a wrist meter - ? If may run high  Is on lisinopril for her bp   DM Lab Results  Component Value Date   HGBA1C 7.3 (H) 05/31/2017   Smoking status - still smokes and has a plan to quit before the end of the month Wants to go cold [place]   It was recommended she get a 2d echo  Also has a heart M   No asa Hx of severe gerd Also all rxn to aleve-itching   Patient Active Problem List   Diagnosis Date Noted  . TIA (transient ischemic attack) 08/29/2017  . Transaminitis 06/05/2017  . Renal insufficiency 06/05/2017  . CAD (coronary artery disease) 12/10/2016  . Thoracic aortic atherosclerosis ([place]) 12/10/2016  . Carotid stenosis 06/04/2016  . Background diabetic retinopathy ([place]) 02/01/2016  . Urinary incontinence, mixed 11/30/2015  . Health maintenance examination 06/02/2015  . Osteopenia 05/27/2015  . Advanced care planning/counseling discussion 05/28/2014  . Abnormal TSH 05/28/2014  . Rhinorrhea 01/09/2014  . COPD, moderate ([place]) 02/12/2013  . HTN (hypertension)   . GERD (gastroesophageal reflux disease)   . Barrett esophagus 02/24/2012  . Medicare annual wellness visit, subsequent 12/24/2011  . Diabetes mellitus type 2, uncontrolled, with complications ([place])   . HLD (hyperlipidemia)   . Smoker    Past Medical History:  Diagnosis Date  . Adenomatous rectal polyp 09/2004   Nicolasa Ducking)  . Background diabetic retinopathy ([place])  01/2016  . Barrett esophagus 02/2012   on EGD biopsy Gustavo Lah)  . Carotid stenosis 06/04/2016   40-59% RICA stenosis. 1-75% LICA stenosis. Repeat yearly (06/2016)  . COPD, moderate ([place]) 02/12/2013   Spirometry - 01/2013: FEV1 = 59%, post alb 78%, ratio 0.6, mod severe obstruction, good response to bronchodilator   . Diabetes type 2, controlled ([place])    DMSE @ [facility] 11/2011  . Diverticulosis 09/2004   by colonoscopy  . Ex-smoker 01/2013  . GERD (gastroesophageal reflux disease)   . History of cleft palate   . HLD (hyperlipidemia)   . HTN (hypertension)    dx per prior records  . Osteopenia 05/2015   T -1.4 femur, foreamr -0.7  . Right-sided sensorineural hearing loss    from infection as child, hsa been seen by Dr. Pryor Ochoa   Past Surgical History:  Procedure Laterality Date  . BLADDER SUSPENSION    . CATARACT EXTRACTION  2013   Right  . COLONOSCOPY  10/08/2004   rectal polyp (tubular adenoma), diverticulosis - Dr. Nicolasa Ducking  . COLONOSCOPY  02/2012   multiple polyps - tubular and sessile adenomas, diverticulosis, decreased rectal tone ([place]) rec rpt 3 yrs  . COLONOSCOPY  2017   stool present, to reschedule Gustavo Lah)  . COLONOSCOPY WITH PROPOFOL N/A 12/21/2015   Procedure: COLONOSCOPY WITH PROPOFOL;  Surgeon: Lollie Sails, MD;  Location: [facility] ENDOSCOPY;  Service: Endoscopy;  Laterality: N/A;  . COLONOSCOPY WITH PROPOFOL N/A 05/30/2016   TAx3, rpt 3 yrs Lollie Sails, MD)  . ESOPHAGOGASTRODUODENOSCOPY  02/2012   irregular z line (+barretts), normal stomach and duodenum, rpt EGD 1 yr  . ESOPHAGOGASTRODUODENOSCOPY  05/2013   irregular z line (+barretts), HH, reflux gastroesophagitis, rpt 2 yrs  . ESOPHAGOGASTRODUODENOSCOPY  11/2015   chronic gastritis, reflux esophagitis, + barrett's, + H pylori, rpt 2 yrs ([place])  . ESOPHAGOGASTRODUODENOSCOPY (EGD) WITH PROPOFOL N/A 12/21/2015   Procedure: ESOPHAGOGASTRODUODENOSCOPY (EGD) WITH PROPOFOL;  Surgeon: Lollie Sails, MD;   Location: [facility] ENDOSCOPY;  Service:  Endoscopy;  Laterality: N/A;  . EYE SURGERY    . TONSILLECTOMY AND ADENOIDECTOMY    . TUBAL LIGATION     Social History   Tobacco Use  . Smoking status: Current Some Day Smoker    Packs/day: 1.00    Years: 56.00    Pack years: 56.00    Types: Cigarettes    Start date: 04/25/1968  . Smokeless tobacco: Never Used  . Tobacco comment: smokes 1 pack week currently  Substance Use Topics  . Alcohol use: No  . Drug use: No   Family History  Problem Relation Age of Onset  . Diabetes Maternal Aunt   . Alcohol abuse Mother   . Heart disease Mother        enlarged heart  . Kidney failure Mother   . Hypertension Mother   . Coronary artery disease Father 57  . Coronary artery disease Brother 10  . Cancer Sister        kidney   . Stroke Neg Hx   . Breast cancer Neg Hx    Allergies  Allergen Reactions  . Aleve [Naproxen Sodium] Itching   Current Outpatient Medications on File Prior to Visit  Medication Sig Dispense Refill  . albuterol (PROVENTIL HFA;VENTOLIN HFA) 108 (90 BASE) MCG/ACT inhaler Inhale 2 puffs into the lungs every 6 (six) hours as needed for wheezing. 1 Inhaler 3  . atorvastatin (LIPITOR) 20 MG tablet TAKE 1 TABLET (20 MG TOTAL) BY MOUTH NIGHTLY 90 tablet 3  . Calcium Carbonate-Vitamin D (CALCIUM 600/VITAMIN D) 600-400 MG-UNIT chew tablet Chew 1 tablet by mouth daily.    . Cholecalciferol (VITAMIN D3) 1000 units CAPS Take 2 capsules (2,000 Units total) by mouth daily. 60 capsule   . lisinopril (PRINIVIL,ZESTRIL) 20 MG tablet TAKE 1 TABLET (20 MG TOTAL) BY MOUTH DAILY. 90 tablet 3  . metFORMIN (GLUCOPHAGE) 1000 MG tablet Take 1 tablet (1,000 mg total) by mouth 2 (two) times daily with a meal. 180 tablet 3  . oxybutynin (DITROPAN) 5 MG tablet TAKE 1 TABLET (5 MG TOTAL) BY MOUTH 2 (TWO) TIMES DAILY AS NEEDED (INCONTINENCE). 60 tablet 6  . pantoprazole (PROTONIX) 40 MG tablet Take 1 tablet (40 mg total) by mouth daily. 90 tablet 3  .  sitaGLIPtin (JANUVIA) 50 MG tablet TAKE 1 TABLET (50 MG TOTAL) BY MOUTH DAILY. 90 tablet 3   No current facility-administered medications on file prior to visit.     Review of Systems  Constitutional: Negative for activity change, appetite change, fatigue, fever and unexpected weight change.  HENT: Negative for congestion, ear pain, rhinorrhea, sinus pressure and sore throat.   Eyes: Negative for pain, redness and visual disturbance.  Respiratory: Negative for cough, shortness of breath and wheezing.   Cardiovascular: Negative for chest pain and palpitations.  Gastrointestinal: Negative for abdominal pain, blood in stool, constipation and diarrhea.  Endocrine: Negative for polydipsia and polyuria.  Genitourinary: Negative for dysuria, frequency and urgency.  Musculoskeletal: Negative for arthralgias, back pain and myalgias.  Skin: Negative for pallor and rash.  Allergic/Immunologic: Negative for environmental allergies.  Neurological: Negative for dizziness, tremors, seizures, syncope, facial asymmetry, speech difficulty, weakness, light-headedness, numbness and headaches.       All prior neuro symptoms are resolved   Hematological: Negative for adenopathy. Does not bruise/bleed easily.  Psychiatric/Behavioral: Negative for decreased concentration and dysphoric mood. The patient is not nervous/anxious.        Objective:   Physical Exam  Constitutional: She is oriented to person, place,  and time. She appears well-developed and well-nourished. No distress.  Chronically ill appearing elderly female   HENT:  Head: Normocephalic and atraumatic.  Nose: Nose normal.  Mouth/Throat: Oropharynx is clear and moist. No oropharyngeal exudate.  No sinus tenderness No temporal tenderness  No TMJ tenderness No ear abnormalities or tenderness in mastoid region   Hard of hearing   Eyes: Pupils are equal, round, and reactive to light. Conjunctivae and EOM are normal. Right eye exhibits no  discharge. Left eye exhibits no discharge. No scleral icterus.  No nystagmus  Neck: Normal range of motion and full passive range of motion without pain. Neck supple. No JVD present. Carotid bruit is present. No tracheal deviation present. No thyromegaly present.  Cardiovascular: Normal rate and regular rhythm.  Murmur heard. M loudest at RSB  Pulmonary/Chest: Effort normal and breath sounds normal. No respiratory distress. She has no wheezes. She has no rales.  Diffusely distant bs   Abdominal: Soft. Bowel sounds are normal. She exhibits no distension and no mass. There is no tenderness.  Musculoskeletal: She exhibits no edema or tenderness.  No pedal edema   Lymphadenopathy:    She has no cervical adenopathy.  Neurological: She is alert and oriented to person, place, and time. She has normal strength and normal reflexes. She displays no atrophy and no tremor. No cranial nerve deficit or sensory deficit. She exhibits normal muscle tone. She displays a negative Romberg sign. Coordination and gait normal.  No focal cerebellar signs  No facial droop   Skin: Skin is warm and dry. No rash noted. No pallor.  Psychiatric: She has a normal mood and affect. Her behavior is normal. Thought content normal.  Nl affect-pleasant  Alert and oriented           Assessment & Plan:   Problem List Items Addressed This Visit      Cardiovascular and Mediastinum   HTN (hypertension) - Primary    Elevated today and at the hospital  Will continue lisinopril and add amlodipine 5 mg (alert if side effects)  Avoiding diuretic in light of CKD F/u with pcp when he returns  Reviewed hospital records, lab results and studies in detail        Relevant Medications   amLODipine (NORVASC) 5 MG tablet   TIA (transient ischemic attack)    Unclear whether she actually had stroke symptoms or just pre syncope Reviewed hospital records, lab results and studies in detail   Very high risk Asa not started due to  hx of esophagitis and also allergy to nsaid  Rev carotids Strongly enc to quit smoking  Work to control bp  Order 2D echo for this and heart M  F/u with pcp       Relevant Medications   amLODipine (NORVASC) 5 MG tablet   Other Relevant Orders   ECHOCARDIOGRAM COMPLETE     Genitourinary   Renal insufficiency    Lab Results  Component Value Date   CREATININE 1.55 (H) 08/25/2017   Re check this today with better hydration  Suspect cause is multi factorial  Reviewed hospital records, lab results and studies in detail        Relevant Orders   Renal function panel     Other   Heart murmur, systolic    2D echo ordered       Relevant Orders   ECHOCARDIOGRAM COMPLETE   Smoker    Disc in detail risks of smoking and possible outcomes including copd, vascular/ heart  disease, cancer , respiratory and sinus infections  Pt voices understanding  In light of recent episode pt is open to the idea of quitting cold [place]- disc with daughter Will make a plan

## 2017-08-29 NOTE — Assessment & Plan Note (Signed)
Elevated today and at the hospital  Will continue lisinopril and add amlodipine 5 mg (alert if side effects)  Avoiding diuretic in light of CKD F/u with pcp when he returns  Reviewed hospital records, lab results and studies in detail

## 2017-08-29 NOTE — Assessment & Plan Note (Signed)
2D echo ordered

## 2017-08-29 NOTE — Assessment & Plan Note (Signed)
Unclear whether she actually had stroke symptoms or just pre syncope Reviewed hospital records, lab results and studies in detail   Very high risk Cheryl Bird not started due to hx of esophagitis and also allergy to nsaid  Rev carotids Strongly enc to quit smoking  Work to control bp  Order 2D echo for this and heart M  F/u with pcp

## 2017-08-29 NOTE — Assessment & Plan Note (Signed)
Disc in detail risks of smoking and possible outcomes including copd, vascular/ heart disease, cancer , respiratory and sinus infections  Pt voices understanding  In light of recent episode pt is open to the idea of quitting cold [place]- disc with daughter Will make a plan

## 2017-08-29 NOTE — Patient Instructions (Addendum)
Aim for 64 oz of fluid per day -mostly water   Please quit smoking asap!  That is your biggest risk factor for stroke   Your blood pressure is high  Continue current medicines Add amlodipine 5 mg daily  If any side effects let us know   I will do a referral for an echocardiogram (ultrasound of the heart) -our office will call you about this   We will have you follow up with Dr Darnell Level when he returns   Labs today for kidney function

## 2017-08-29 NOTE — Assessment & Plan Note (Signed)
Lab Results  Component Value Date   CREATININE 1.55 (H) 08/25/2017   Re check this today with better hydration  Suspect cause is multi factorial  Reviewed hospital records, lab results and studies in detail

## 2017-08-30 LAB — RENAL FUNCTION PANEL
Albumin: 3.9 g/dL (ref 3.5–5.2)
BUN: 25 mg/dL — ABNORMAL HIGH (ref 6–23)
CHLORIDE: 106 meq/L (ref 96–112)
CO2: 25 mEq/L (ref 19–32)
Calcium: 9 mg/dL (ref 8.4–10.5)
Creatinine, Ser: 1.31 mg/dL — ABNORMAL HIGH (ref 0.40–1.20)
GFR: 42.42 mL/min — AB (ref >60.00)
Glucose, Bld: 89 mg/dL (ref 70–99)
PHOSPHORUS: 3.7 mg/dL (ref 2.3–4.6)
Potassium: 5.5 mEq/L — ABNORMAL HIGH (ref 3.5–5.1)
SODIUM: 140 meq/L (ref 135–145)

## 2017-09-06 ENCOUNTER — Telehealth: Payer: Self-pay | Admitting: Radiology

## 2017-09-06 ENCOUNTER — Encounter: Payer: Self-pay | Admitting: Family Medicine

## 2017-09-06 ENCOUNTER — Ambulatory Visit (INDEPENDENT_AMBULATORY_CARE_PROVIDER_SITE_OTHER): Payer: Medicare Other | Admitting: Family Medicine

## 2017-09-06 VITALS — BP 136/66 | HR 89 | Temp 98.0°F | Ht 62.0 in | Wt 140.8 lb

## 2017-09-06 DIAGNOSIS — Z87891 Personal history of nicotine dependence: Secondary | ICD-10-CM | POA: Diagnosis not present

## 2017-09-06 DIAGNOSIS — I10 Essential (primary) hypertension: Secondary | ICD-10-CM | POA: Diagnosis not present

## 2017-09-06 DIAGNOSIS — K2100 Gastro-esophageal reflux disease with esophagitis, without bleeding: Secondary | ICD-10-CM

## 2017-09-06 DIAGNOSIS — G459 Transient cerebral ischemic attack, unspecified: Secondary | ICD-10-CM

## 2017-09-06 DIAGNOSIS — R011 Cardiac murmur, unspecified: Secondary | ICD-10-CM

## 2017-09-06 DIAGNOSIS — N289 Disorder of kidney and ureter, unspecified: Secondary | ICD-10-CM | POA: Diagnosis not present

## 2017-09-06 DIAGNOSIS — K2270 Barrett's esophagus without dysplasia: Secondary | ICD-10-CM | POA: Diagnosis not present

## 2017-09-06 DIAGNOSIS — M65331 Trigger finger, right middle finger: Secondary | ICD-10-CM

## 2017-09-06 DIAGNOSIS — I6523 Occlusion and stenosis of bilateral carotid arteries: Secondary | ICD-10-CM

## 2017-09-06 MED ORDER — LISINOPRIL 10 MG PO TABS: 10.0000 mg | ORAL_TABLET | Freq: Every day | ORAL | 1 refills | Status: DC

## 2017-09-06 MED ORDER — PANTOPRAZOLE SODIUM 40 MG PO TBEC: 40.0000 mg | DELAYED_RELEASE_TABLET | ORAL | 3 refills | Status: DC

## 2017-09-06 MED ORDER — ASPIRIN EC 81 MG PO TBEC: 81.0000 mg | DELAYED_RELEASE_TABLET | ORAL | Status: AC

## 2017-09-06 NOTE — Progress Notes (Signed)
BP 136/66 (BP Location: Left Arm, Patient Position: Sitting, Cuff Size: Normal)   Pulse 89   Temp 98 F (36.7 C) (Oral)   Ht 5\' 2"  (1.575 m)   Wt 140 lb 12 oz (63.8 kg)   SpO2 94%   BMI 25.74 kg/m    CC: f/u visit Subjective:    Patient ID: Cheryl Bird, female    DOB: [birth year], 72 y.o.   MRN: [id number]  HPI: Cheryl Bird is a 72 y.o. female presenting on 09/06/2017 for Follow-up (Here for follow up per Dr. Glori Bickers.)   See prior note for details. Recent TIA presenting with facial droop s/p ER evaluation, then saw Dr Glori Bickers last week. Notes reviewed. ER eval - sbp >200. MR brain - no acute abnormality, chronic ischemic changes. Diagnosed with TIA/dehydration.   Amlodipine 5mg  was added to antihypertensive regimen. Echocardiogram pending.  Working on full smoking cessation by the end of the month.   Labwork last week showed improving kidney function, but hyperkalemia so lisinopril was cut in half. Due for rpt labs today.   PPI started 05/2017 - may be causing diarrhea. She has even had some accidents due to lack of bowel control. So she has cut this to QOD dosing.   She threw her cigarettes away! May try nicotine patch.   Relevant past medical, surgical, family and social history reviewed and updated as indicated. Interim medical history since our last visit reviewed. Allergies and medications reviewed and updated. Outpatient Medications Prior to Visit  Medication Sig Dispense Refill  . albuterol (PROVENTIL HFA;VENTOLIN HFA) 108 (90 BASE) MCG/ACT inhaler Inhale 2 puffs into the lungs every 6 (six) hours as needed for wheezing. 1 Inhaler 3  . amLODipine (NORVASC) 5 MG tablet Take 1 tablet (5 mg total) by mouth daily. 30 tablet 3  . atorvastatin (LIPITOR) 20 MG tablet TAKE 1 TABLET (20 MG TOTAL) BY MOUTH NIGHTLY 90 tablet 3  . Calcium Carbonate-Vitamin D (CALCIUM 600/VITAMIN D) 600-400 MG-UNIT chew tablet Chew 1 tablet by mouth daily.    . Cholecalciferol (VITAMIN D3) 1000  units CAPS Take 2 capsules (2,000 Units total) by mouth daily. 60 capsule   . metFORMIN (GLUCOPHAGE) 1000 MG tablet Take 1 tablet (1,000 mg total) by mouth 2 (two) times daily with a meal. 180 tablet 3  . oxybutynin (DITROPAN) 5 MG tablet TAKE 1 TABLET (5 MG TOTAL) BY MOUTH 2 (TWO) TIMES DAILY AS NEEDED (INCONTINENCE). 60 tablet 6  . sitaGLIPtin (JANUVIA) 50 MG tablet TAKE 1 TABLET (50 MG TOTAL) BY MOUTH DAILY. 90 tablet 3  . lisinopril (PRINIVIL,ZESTRIL) 20 MG tablet TAKE 1 TABLET (20 MG TOTAL) BY MOUTH DAILY. (Patient taking differently: TAKES 1/2 TABLET DAILY) 90 tablet 3  . pantoprazole (PROTONIX) 40 MG tablet Take 1 tablet (40 mg total) by mouth daily. 90 tablet 3   No facility-administered medications prior to visit.      Per HPI unless specifically indicated in ROS section below Review of Systems     Objective:    BP 136/66 (BP Location: Left Arm, Patient Position: Sitting, Cuff Size: Normal)   Pulse 89   Temp 98 F (36.7 C) (Oral)   Ht 5\' 2"  (1.575 m)   Wt 140 lb 12 oz (63.8 kg)   SpO2 94%   BMI 25.74 kg/m   Wt Readings from Last 3 Encounters:  09/06/17 140 lb 12 oz (63.8 kg)  08/29/17 140 lb 12 oz (63.8 kg)  08/25/17 140 lb (63.5 kg)  Physical Exam  Constitutional: She appears well-developed and well-nourished. No distress.  HENT:  Mouth/Throat: Oropharynx is clear and moist. No oropharyngeal exudate.  New dentures  Neck: Normal range of motion. Neck supple. Carotid bruit is present (bilateral). No thyromegaly present.  Cardiovascular: Normal rate and regular rhythm.  Murmur (3/6 systolic) heard. Pulmonary/Chest: Effort normal and breath sounds normal. No respiratory distress. She has no wheezes. She has no rales.  Musculoskeletal: She exhibits no edema.  Neurological: She is alert.  No facial droop Sensation intact EOMI  Nursing note and vitals reviewed.  Results for orders placed or performed in visit on 08/29/17  Renal function panel  Result Value Ref  Range   Sodium 140 135 - 145 mEq/L   Potassium 5.5 (H) 3.5 - 5.1 mEq/L   Chloride 106 96 - 112 mEq/L   CO2 25 19 - 32 mEq/L   Calcium 9.0 8.4 - 10.5 mg/dL   Albumin 3.9 3.5 - 5.2 g/dL   BUN 25 (H) 6 - 23 mg/dL   Creatinine, Ser 1.31 (H) 0.40 - 1.20 mg/dL   Glucose, Bld 89 70 - 99 mg/dL   Phosphorus 3.7 2.3 - 4.6 mg/dL   GFR 42.42 (L) >60.00 mL/min   Lab Results  Component Value Date   HGBA1C 7.3 (H) 05/31/2017       Assessment & Plan:   Problem List Items Addressed This Visit    Barrett esophagus   Carotid stenosis    Reviewed recent Carotid US 06/2017 - will be due in 1 year.       Relevant Medications   aspirin EC 81 MG tablet   lisinopril (PRINIVIL,ZESTRIL) 10 MG tablet   Ex-smoker    Congratulated on smoking cessation to date! She will look into possible free nicotine patches through her insurance. Discussed should use lowest dose. Encouraged ongoing full cessation.       GERD (gastroesophageal reflux disease)    Doing better with PPI - however this may be causing diarrhea - will decrease pantoprazole to 40mg  QOD dosing. In h/o barrett's, continue PPI regularly.       Relevant Medications   pantoprazole (PROTONIX) 40 MG tablet   Heart murmur, systolic    Pending echo      HTN (hypertension)    Chronic, improved on current regimen (even on lower lisinopril). Continue this regimen. Update renal panel today.       Relevant Medications   aspirin EC 81 MG tablet   lisinopril (PRINIVIL,ZESTRIL) 10 MG tablet   Renal insufficiency    Update renal panel today.       Relevant Orders   Renal function panel   TIA (transient ischemic attack) - Primary    Presumed. Start aspirin 81mg  QOD dosing - on days she takes PPI. Continue working towards lipid and BP and glycemic control. Caution with h/o severe GERD and barrett's. She has quit smoking!!      Relevant Medications   aspirin EC 81 MG tablet   lisinopril (PRINIVIL,ZESTRIL) 10 MG tablet       Meds ordered this  encounter  Medications  . pantoprazole (PROTONIX) 40 MG tablet    Sig: Take 1 tablet (40 mg total) by mouth every other day.    Dispense:  45 tablet    Refill:  3  . aspirin EC 81 MG tablet    Sig: Take 1 tablet (81 mg total) by mouth every other day.  . lisinopril (PRINIVIL,ZESTRIL) 10 MG tablet    Sig: Take 1  tablet (10 mg total) by mouth daily.    Dispense:  90 tablet    Refill:  1    New sig   Orders Placed This Encounter  Procedures  . Renal function panel    Follow up plan: No follow-ups on file.  Ria Bush, MD

## 2017-09-06 NOTE — Assessment & Plan Note (Signed)
Doing better with PPI - however this may be causing diarrhea - will decrease pantoprazole to 40mg  QOD dosing. In h/o barrett's, continue PPI regularly.

## 2017-09-06 NOTE — Assessment & Plan Note (Signed)
Reviewed recent Carotid US 06/2017 - will be due in 1 year.

## 2017-09-06 NOTE — Assessment & Plan Note (Signed)
Chronic, improved on current regimen (even on lower lisinopril). Continue this regimen. Update renal panel today.

## 2017-09-06 NOTE — Assessment & Plan Note (Signed)
Update renal panel today.  

## 2017-09-06 NOTE — Assessment & Plan Note (Signed)
Congratulated on smoking cessation to date! She will look into possible free nicotine patches through her insurance. Discussed should use lowest dose. Encouraged ongoing full cessation.

## 2017-09-06 NOTE — Assessment & Plan Note (Addendum)
Presumed. Start aspirin 81mg  QOD dosing - on days she takes PPI. Continue working towards lipid and BP and glycemic control. Caution with h/o severe GERD and barrett's. She has quit smoking!!

## 2017-09-06 NOTE — Telephone Encounter (Signed)
Patient stated she has a right middle finger that is getting stuck and she has to manually pull it up. She forgot to tell you at her appt

## 2017-09-06 NOTE — Assessment & Plan Note (Signed)
Pending echo

## 2017-09-06 NOTE — Patient Instructions (Addendum)
Recheck labs today.  Continue lower lisinopril dose - new dose sent to pharmacy.  Ok to continue pantoprazole at 40mg  every other day dose.  Start enteric coated aspirin 81mg  every other day (on days you take pantoprazole).  Congratulations on stopping smoking! Keep it up! May try nicotine patch (39mcg patch should be enough).  Keep follow up visit in August.

## 2017-09-07 LAB — RENAL FUNCTION PANEL
ALBUMIN: 4 g/dL (ref 3.5–5.2)
BUN: 18 mg/dL (ref 6–23)
CALCIUM: 9.6 mg/dL (ref 8.4–10.5)
CO2: 25 mEq/L (ref 19–32)
CREATININE: 1.13 mg/dL (ref 0.40–1.20)
Chloride: 109 mEq/L (ref 96–112)
GFR: 50.31 mL/min — ABNORMAL LOW (ref >60.00)
GLUCOSE: 118 mg/dL — AB (ref 70–99)
Phosphorus: 3.2 mg/dL (ref 2.3–4.6)
Potassium: 5.7 mEq/L — ABNORMAL HIGH (ref 3.5–5.1)
Sodium: 141 mEq/L (ref 135–145)

## 2017-09-10 ENCOUNTER — Other Ambulatory Visit: Payer: Self-pay | Admitting: Family Medicine

## 2017-09-10 DIAGNOSIS — E875 Hyperkalemia: Secondary | ICD-10-CM

## 2017-09-10 NOTE — Telephone Encounter (Signed)
Sounds like trigger finger. If she'd like, I can refer her to hand surgeon for evaluation.

## 2017-09-11 NOTE — Telephone Encounter (Signed)
Referral placed.

## 2017-09-11 NOTE — Telephone Encounter (Signed)
Spoke with pt relaying Dr. G's message.  Pt verbalizes understanding and agrees to referral.  

## 2017-09-11 NOTE — Addendum Note (Signed)
Addended by: Ria Bush on: 09/11/2017 02:02 PM   Modules accepted: Orders

## 2017-09-14 ENCOUNTER — Ambulatory Visit (INDEPENDENT_AMBULATORY_CARE_PROVIDER_SITE_OTHER): Payer: Medicare Other

## 2017-09-14 ENCOUNTER — Other Ambulatory Visit: Payer: Self-pay

## 2017-09-14 DIAGNOSIS — R011 Cardiac murmur, unspecified: Secondary | ICD-10-CM

## 2017-09-14 DIAGNOSIS — G459 Transient cerebral ischemic attack, unspecified: Secondary | ICD-10-CM

## 2017-09-19 ENCOUNTER — Other Ambulatory Visit: Payer: Self-pay | Admitting: Family Medicine

## 2017-09-19 ENCOUNTER — Other Ambulatory Visit (INDEPENDENT_AMBULATORY_CARE_PROVIDER_SITE_OTHER): Payer: Medicare Other

## 2017-09-19 ENCOUNTER — Encounter: Payer: Self-pay | Admitting: Family Medicine

## 2017-09-19 DIAGNOSIS — E875 Hyperkalemia: Secondary | ICD-10-CM | POA: Diagnosis not present

## 2017-09-19 LAB — POTASSIUM: POTASSIUM: 4.6 meq/L (ref 3.5–5.1)

## 2017-10-02 DIAGNOSIS — M79641 Pain in right hand: Secondary | ICD-10-CM | POA: Diagnosis not present

## 2017-10-02 DIAGNOSIS — M65331 Trigger finger, right middle finger: Secondary | ICD-10-CM | POA: Diagnosis not present

## 2017-10-15 ENCOUNTER — Encounter: Payer: Self-pay | Admitting: Family Medicine

## 2017-10-15 DIAGNOSIS — M79641 Pain in right hand: Secondary | ICD-10-CM | POA: Insufficient documentation

## 2017-11-25 ENCOUNTER — Telehealth: Payer: Self-pay

## 2017-11-25 NOTE — Telephone Encounter (Signed)
Call pt on 11-25-17 at 11:30 left message

## 2017-11-27 ENCOUNTER — Telehealth: Payer: Self-pay | Admitting: Nurse Practitioner

## 2017-11-28 ENCOUNTER — Other Ambulatory Visit: Payer: Self-pay | Admitting: Family Medicine

## 2017-11-28 ENCOUNTER — Telehealth: Payer: Self-pay | Admitting: *Deleted

## 2017-11-28 DIAGNOSIS — IMO0002 Reserved for concepts with insufficient information to code with codable children: Secondary | ICD-10-CM

## 2017-11-28 DIAGNOSIS — N289 Disorder of kidney and ureter, unspecified: Secondary | ICD-10-CM

## 2017-11-28 DIAGNOSIS — Z122 Encounter for screening for malignant neoplasm of respiratory organs: Secondary | ICD-10-CM

## 2017-11-28 DIAGNOSIS — E1165 Type 2 diabetes mellitus with hyperglycemia: Secondary | ICD-10-CM

## 2017-11-28 DIAGNOSIS — E785 Hyperlipidemia, unspecified: Secondary | ICD-10-CM

## 2017-11-28 DIAGNOSIS — R7989 Other specified abnormal findings of blood chemistry: Secondary | ICD-10-CM

## 2017-11-28 DIAGNOSIS — E118 Type 2 diabetes mellitus with unspecified complications: Secondary | ICD-10-CM

## 2017-11-28 DIAGNOSIS — Z87891 Personal history of nicotine dependence: Secondary | ICD-10-CM

## 2017-11-28 NOTE — Telephone Encounter (Signed)
Patient has been notified that annual lung cancer screening low dose CT scan is due currently or will be in near future. Confirmed that patient is within the age range of 55-77, and asymptomatic, (no signs or symptoms of lung cancer). Patient denies illness that would prevent curative treatment for lung cancer if found. Verified smoking history, (former, quit 2019). The shared decision making visit was done 12/07/16. Patient is agreeable for CT scan being scheduled.

## 2017-11-29 ENCOUNTER — Other Ambulatory Visit (INDEPENDENT_AMBULATORY_CARE_PROVIDER_SITE_OTHER): Payer: Medicare Other

## 2017-11-29 DIAGNOSIS — E1165 Type 2 diabetes mellitus with hyperglycemia: Secondary | ICD-10-CM

## 2017-11-29 DIAGNOSIS — E785 Hyperlipidemia, unspecified: Secondary | ICD-10-CM | POA: Diagnosis not present

## 2017-11-29 DIAGNOSIS — N289 Disorder of kidney and ureter, unspecified: Secondary | ICD-10-CM | POA: Diagnosis not present

## 2017-11-29 DIAGNOSIS — E118 Type 2 diabetes mellitus with unspecified complications: Secondary | ICD-10-CM

## 2017-11-29 DIAGNOSIS — IMO0002 Reserved for concepts with insufficient information to code with codable children: Secondary | ICD-10-CM

## 2017-11-29 DIAGNOSIS — R7989 Other specified abnormal findings of blood chemistry: Secondary | ICD-10-CM

## 2017-11-29 LAB — CBC WITH DIFFERENTIAL/PLATELET
BASOS PCT: 0.8 % (ref 0.0–3.0)
Basophils Absolute: 0.1 10*3/uL (ref 0.0–0.1)
Eosinophils Absolute: 0.4 10*3/uL (ref 0.0–0.7)
Eosinophils Relative: 5.8 % — ABNORMAL HIGH (ref 0.0–5.0)
HCT: 38.9 % (ref 36.0–46.0)
Hemoglobin: 12.8 g/dL (ref 12.0–15.0)
LYMPHS ABS: 2.3 10*3/uL (ref 0.7–4.0)
Lymphocytes Relative: 29.2 % (ref 12.0–46.0)
MCHC: 33 g/dL (ref 30.0–36.0)
MCV: 88.7 fl (ref 78.0–100.0)
Monocytes Absolute: 0.7 10*3/uL (ref 0.1–1.0)
Monocytes Relative: 8.4 % (ref 3.0–12.0)
NEUTROS ABS: 4.4 10*3/uL (ref 1.4–7.7)
NEUTROS PCT: 55.8 % (ref 43.0–77.0)
PLATELETS: 212 10*3/uL (ref 150.0–400.0)
RBC: 4.38 Mil/uL (ref 3.87–5.11)
RDW: 13.4 % (ref 11.5–15.5)
WBC: 7.8 10*3/uL (ref 4.0–10.5)

## 2017-11-29 LAB — COMPREHENSIVE METABOLIC PANEL
ALT: 36 U/L — ABNORMAL HIGH (ref 0–35)
AST: 48 U/L — ABNORMAL HIGH (ref 0–37)
Albumin: 3.9 g/dL (ref 3.5–5.2)
Alkaline Phosphatase: 69 U/L (ref 39–117)
BUN: 19 mg/dL (ref 6–23)
CHLORIDE: 104 meq/L (ref 96–112)
CO2: 29 mEq/L (ref 19–32)
Calcium: 9.6 mg/dL (ref 8.4–10.5)
Creatinine, Ser: 1.1 mg/dL (ref 0.40–1.20)
GFR: 51.86 mL/min — ABNORMAL LOW (ref >60.00)
GLUCOSE: 176 mg/dL — AB (ref 70–99)
POTASSIUM: 4.3 meq/L (ref 3.5–5.1)
SODIUM: 141 meq/L (ref 135–145)
Total Bilirubin: 0.4 mg/dL (ref 0.2–1.2)
Total Protein: 6.6 g/dL (ref 6.0–8.3)

## 2017-11-29 LAB — LIPID PANEL
CHOL/HDL RATIO: 2
Cholesterol: 143 mg/dL (ref 0–200)
HDL: 59 mg/dL (ref >39.00)
LDL CALC: 64 mg/dL (ref 0–99)
NONHDL: 84.04
Triglycerides: 100 mg/dL (ref 0.0–149.0)
VLDL: 20 mg/dL (ref 0.0–40.0)

## 2017-11-29 LAB — MICROALBUMIN / CREATININE URINE RATIO
Creatinine,U: 68.9 mg/dL
MICROALB UR: 61 mg/dL — AB (ref 0.0–1.9)
Microalb Creat Ratio: 88.6 mg/g — ABNORMAL HIGH (ref 0.0–30.0)

## 2017-11-29 LAB — HEMOGLOBIN A1C: HEMOGLOBIN A1C: 7.2 % — AB (ref 4.6–6.5)

## 2017-11-29 LAB — TSH: TSH: 4.38 u[IU]/mL (ref 0.35–4.50)

## 2017-11-29 LAB — T4, FREE: Free T4: 0.78 ng/dL (ref 0.60–1.60)

## 2017-12-04 ENCOUNTER — Encounter: Payer: Self-pay | Admitting: Family Medicine

## 2017-12-04 ENCOUNTER — Ambulatory Visit (INDEPENDENT_AMBULATORY_CARE_PROVIDER_SITE_OTHER): Payer: Medicare Other | Admitting: Family Medicine

## 2017-12-04 VITALS — BP 122/70 | HR 92 | Temp 98.2°F | Ht 62.0 in | Wt 138.5 lb

## 2017-12-04 DIAGNOSIS — IMO0002 Reserved for concepts with insufficient information to code with codable children: Secondary | ICD-10-CM

## 2017-12-04 DIAGNOSIS — Z87891 Personal history of nicotine dependence: Secondary | ICD-10-CM | POA: Diagnosis not present

## 2017-12-04 DIAGNOSIS — R7989 Other specified abnormal findings of blood chemistry: Secondary | ICD-10-CM

## 2017-12-04 DIAGNOSIS — R740 Nonspecific elevation of levels of transaminase and lactic acid dehydrogenase [LDH]: Secondary | ICD-10-CM

## 2017-12-04 DIAGNOSIS — E1122 Type 2 diabetes mellitus with diabetic chronic kidney disease: Secondary | ICD-10-CM

## 2017-12-04 DIAGNOSIS — K2270 Barrett's esophagus without dysplasia: Secondary | ICD-10-CM

## 2017-12-04 DIAGNOSIS — M79641 Pain in right hand: Secondary | ICD-10-CM | POA: Diagnosis not present

## 2017-12-04 DIAGNOSIS — N183 Chronic kidney disease, stage 3 unspecified: Secondary | ICD-10-CM

## 2017-12-04 DIAGNOSIS — E1165 Type 2 diabetes mellitus with hyperglycemia: Secondary | ICD-10-CM

## 2017-12-04 DIAGNOSIS — I10 Essential (primary) hypertension: Secondary | ICD-10-CM | POA: Diagnosis not present

## 2017-12-04 DIAGNOSIS — G459 Transient cerebral ischemic attack, unspecified: Secondary | ICD-10-CM

## 2017-12-04 DIAGNOSIS — R7401 Elevation of levels of liver transaminase levels: Secondary | ICD-10-CM

## 2017-12-04 DIAGNOSIS — E118 Type 2 diabetes mellitus with unspecified complications: Secondary | ICD-10-CM

## 2017-12-04 MED ORDER — LISINOPRIL 2.5 MG PO TABS: 2.5000 mg | ORAL_TABLET | Freq: Every day | ORAL | 6 refills | Status: DC

## 2017-12-04 NOTE — Progress Notes (Signed)
BP 122/70 (BP Location: Left Arm, Patient Position: Sitting, Cuff Size: Normal)   Pulse 92   Temp 98.2 F (36.8 C) (Oral)   Ht 5\' 2"  (1.575 m)   Wt 138 lb 8 oz (62.8 kg)   SpO2 93%   BMI 25.33 kg/m    CC: 6 mo f/u visit Subjective:    Patient ID: Cheryl Bird, female    DOB: January 27, 1946, 72 y.o.   MRN: [id number]  HPI: Cheryl Bird is a 72 y.o. female presenting on 12/04/2017 for 6 mo follow up   Upcoming lung scan for lung cancer screening. She is going to pass by that office after appt today to make sure she has the correct location.   Bilateral 3rd finger pain/stiffness. Taping 3rd fingers together at night. No joint swelling, redness or warmth. She would like local referral (not in [place]) for further evaluation. 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   Presumed TIA 08/2017 in setting of marked hypertension s/p ER eval - MR brain without acute abnormality. Amlodipine was added to her regimen at that time. Aspirin 81mg  was added QOD on days she takes PPI. Lisinopril led to Cr bump and hyperkalemia - now off this medication. Endorses home readings 120/70s as well   Barrett's esophagus - due for rpt EGD. On pantoprazole 40mg  QOD - daily dosing may have led to diarrhea.   Smoking - fully quit 08/2017! Continues staying abstinent.   She now finally has her partial lower dentures  Relevant past medical, surgical, family and social history reviewed and updated as indicated. Interim medical history since our last visit reviewed. Allergies and medications reviewed and updated. Outpatient Medications Prior to Visit  Medication Sig Dispense Refill  . albuterol (PROVENTIL HFA;VENTOLIN HFA) 108 (90 BASE) MCG/ACT inhaler Inhale 2 puffs into the lungs every 6 (six) hours as needed for wheezing. 1 Inhaler 3  . amLODipine (NORVASC) 5 MG tablet Take 1 tablet (5 mg total) by mouth daily. 30 tablet 3  . aspirin EC 81 MG  tablet Take 1 tablet (81 mg total) by mouth every other day.    Marland Kitchen atorvastatin (LIPITOR) 20 MG tablet TAKE 1 TABLET (20 MG TOTAL) BY MOUTH NIGHTLY 90 tablet 3  . Calcium Carbonate-Vitamin D (CALCIUM 600/VITAMIN D) 600-400 MG-UNIT chew tablet Chew 1 tablet by mouth daily.    . Cholecalciferol (VITAMIN D3) 1000 units CAPS Take 2 capsules (2,000 Units total) by mouth daily. 60 capsule   . metFORMIN (GLUCOPHAGE) 1000 MG tablet Take 1 tablet (1,000 mg total) by mouth 2 (two) times daily with a meal. 180 tablet 3  . oxybutynin (DITROPAN) 5 MG tablet TAKE 1 TABLET (5 MG TOTAL) BY MOUTH 2 (TWO) TIMES DAILY AS NEEDED (INCONTINENCE). 60 tablet 6  . pantoprazole (PROTONIX) 40 MG tablet Take 1 tablet (40 mg total) by mouth every other day. 45 tablet 3  . sitaGLIPtin (JANUVIA) 50 MG tablet TAKE 1 TABLET (50 MG TOTAL) BY MOUTH DAILY. 90 tablet 3   No facility-administered medications prior to visit.      Per HPI unless specifically indicated in ROS section below Review of Systems     Objective:    BP 122/70 (BP Location: Left Arm, Patient Position: Sitting, Cuff Size: Normal)   Pulse 92   Temp 98.2 F (36.8 C) (Oral)   Ht 5\' 2"  (1.575 m)   Wt 138 lb 8 oz (62.8 kg)   SpO2 93%  BMI 25.33 kg/m   Wt Readings from Last 3 Encounters:  12/04/17 138 lb 8 oz (62.8 kg)  09/06/17 140 lb 12 oz (63.8 kg)  08/29/17 140 lb 12 oz (63.8 kg)    Physical Exam  Constitutional: She appears well-developed and well-nourished. No distress.  HENT:  Mouth/Throat: Oropharynx is clear and moist. No oropharyngeal exudate.  Cardiovascular: Normal rate and regular rhythm.  Murmur (2/6 systolic) heard. Pulmonary/Chest: Effort normal and breath sounds normal. No respiratory distress. She has no wheezes. She has no rales.  Musculoskeletal: She exhibits no edema.  Tender to palpation bilateral 3rd metacarpals at palpable nodule R>L overlying tendon  Psychiatric: She has a normal mood and affect.  Nursing note and vitals  reviewed.  Results for orders placed or performed in visit on 11/29/17  Microalbumin / creatinine urine ratio  Result Value Ref Range   Microalb, Ur 61.0 (H) 0.0 - 1.9 mg/dL   Creatinine,U 68.9 mg/dL   Microalb Creat Ratio 88.6 (H) 0.0 - 30.0 mg/g  CBC with Differential/Platelet  Result Value Ref Range   WBC 7.8 4.0 - 10.5 K/uL   RBC 4.38 3.87 - 5.11 Mil/uL   Hemoglobin 12.8 12.0 - 15.0 g/dL   HCT 38.9 36.0 - 46.0 %   MCV 88.7 78.0 - 100.0 fl   MCHC 33.0 30.0 - 36.0 g/dL   RDW 13.4 11.5 - 15.5 %   Platelets 212.0 150.0 - 400.0 K/uL   Neutrophils Relative % 55.8 43.0 - 77.0 %   Lymphocytes Relative 29.2 12.0 - 46.0 %   Monocytes Relative 8.4 3.0 - 12.0 %   Eosinophils Relative 5.8 (H) 0.0 - 5.0 %   Basophils Relative 0.8 0.0 - 3.0 %   Neutro Abs 4.4 1.4 - 7.7 K/uL   Lymphs Abs 2.3 0.7 - 4.0 K/uL   Monocytes Absolute 0.7 0.1 - 1.0 K/uL   Eosinophils Absolute 0.4 0.0 - 0.7 K/uL   Basophils Absolute 0.1 0.0 - 0.1 K/uL  Hemoglobin A1c  Result Value Ref Range   Hgb A1c MFr Bld 7.2 (H) 4.6 - 6.5 %  T4, free  Result Value Ref Range   Free T4 0.78 0.60 - 1.60 ng/dL  TSH  Result Value Ref Range   TSH 4.38 0.35 - 4.50 uIU/mL  Comprehensive metabolic panel  Result Value Ref Range   Sodium 141 135 - 145 mEq/L   Potassium 4.3 3.5 - 5.1 mEq/L   Chloride 104 96 - 112 mEq/L   CO2 29 19 - 32 mEq/L   Glucose, Bld 176 (H) 70 - 99 mg/dL   BUN 19 6 - 23 mg/dL   Creatinine, Ser 1.10 0.40 - 1.20 mg/dL   Total Bilirubin 0.4 0.2 - 1.2 mg/dL   Alkaline Phosphatase 69 39 - 117 U/L   AST 48 (H) 0 - 37 U/L   ALT 36 (H) 0 - 35 U/L   Total Protein 6.6 6.0 - 8.3 g/dL   Albumin 3.9 3.5 - 5.2 g/dL   Calcium 9.6 8.4 - 10.5 mg/dL   GFR 51.86 (L) >60.00 mL/min  Lipid panel  Result Value Ref Range   Cholesterol 143 0 - 200 mg/dL   Triglycerides 100.0 0.0 - 149.0 mg/dL   HDL 59.00 >39.00 mg/dL   VLDL 20.0 0.0 - 40.0 mg/dL   LDL Cholesterol 64 0 - 99 mg/dL   Total CHOL/HDL Ratio 2    NonHDL  84.04       Assessment & Plan:   Problem List Items  Addressed This Visit    Transaminitis    Persists - further evaluate with abd [place].       Relevant Orders   US Abdomen Complete   TIA (transient ischemic attack)    Continue aspirin, statin.       Relevant Medications   lisinopril (ZESTRIL) 2.5 MG tablet   Right hand pain    Saw hand surgery ([facility]) dx with CMC and DIP OA along with stenosing tenosynovitis R middle finger s/p steroid injection with benefit. Now bilateral hands worsening pain, would like local referral for re evaluation (doesn't want to drive to [place]). I suggested she see Dr Lorelei Pont and if referral needed would try to stay local in [place].       HTN (hypertension) - Primary    Chronic, stable only on amlodipine 5mg  daily. Will add low dose ACEI in h/o CKD. Monitor for recurrent hyperkalemia.       Relevant Medications   lisinopril (ZESTRIL) 2.5 MG tablet   Ex-smoker    Remains abstinent - congratulated      Diabetes mellitus type 2, uncontrolled, with complications (HCC)    Slow improvement. Continue januvia and metformin.       Relevant Medications   lisinopril (ZESTRIL) 2.5 MG tablet   CKD stage 3 due to type 2 diabetes mellitus ([place])    Progressive over the past year. Worsening microalbuminuria off lisinopril - restart low dose ACEI, caution in h/o Cr bump on ACEI. Check abd [place] including kidneys. Consider SPEP to complete evaluation.       Relevant Medications   lisinopril (ZESTRIL) 2.5 MG tablet   Barrett esophagus    Has not been contacted by GI. I asked her to call their office to schedule appt      Abnormal TSH    Latest TFTs stable.           Meds ordered this encounter  Medications  . lisinopril (ZESTRIL) 2.5 MG tablet    Sig: Take 1 tablet (2.5 mg total) by mouth daily.    Dispense:  30 tablet    Refill:  6   Orders Placed This Encounter  Procedures  . US Abdomen Complete    Standing Status:   Future    Standing Expiration  Date:   02/04/2019    Order Specific Question:   Reason for Exam (SYMPTOM  OR DIAGNOSIS REQUIRED)    Answer:   transaminitis    Order Specific Question:   Preferred imaging location?    Answer:   [facility]    Follow up plan: Return in about 3 months (around 03/06/2018) for follow up visit.  Ria Bush, MD

## 2017-12-04 NOTE — Assessment & Plan Note (Signed)
Remains abstinent - congratulated 

## 2017-12-04 NOTE — Patient Instructions (Addendum)
Call GI office to see when you're due for endoscopy.  Kidney function a bit worse. Start lowest dose lisinopril 2.5mg  daily - sent to pharmacy.  We will order abdominal ultrasound to check liver.  Good to see you today. Return as needed or in 3-4 months for follow up visit.  Ask to schedule appointment with Dr Lorelei Pont for evaluation of hand pain and possible tendon nodules

## 2017-12-04 NOTE — Assessment & Plan Note (Signed)
Slow improvement. Continue januvia and metformin.

## 2017-12-04 NOTE — Assessment & Plan Note (Signed)
Has not been contacted by GI. I asked her to call their office to schedule appt

## 2017-12-04 NOTE — Assessment & Plan Note (Signed)
Continue aspirin, statin.  

## 2017-12-04 NOTE — Assessment & Plan Note (Addendum)
Chronic, stable only on amlodipine 5mg  daily. Will add low dose ACEI in h/o CKD. Monitor for recurrent hyperkalemia.

## 2017-12-04 NOTE — Assessment & Plan Note (Addendum)
Persists - further evaluate with abd [place].

## 2017-12-04 NOTE — Assessment & Plan Note (Signed)
Progressive over the past year. Worsening microalbuminuria off lisinopril - restart low dose ACEI, caution in h/o Cr bump on ACEI. Check abd [place] including kidneys. Consider SPEP to complete evaluation.

## 2017-12-04 NOTE — Assessment & Plan Note (Signed)
Latest TFTs stable.

## 2017-12-04 NOTE — Assessment & Plan Note (Addendum)
Saw hand surgery Cheryl Bird) dx with CMC and DIP OA along with stenosing tenosynovitis R middle finger s/p steroid injection with benefit. Now bilateral hands worsening pain, would like local referral for re evaluation (doesn't want to drive to [place]). I suggested she see Dr Lorelei Pont and if referral needed would try to stay local in [place].

## 2017-12-06 ENCOUNTER — Ambulatory Visit: Payer: Medicare Other

## 2017-12-06 ENCOUNTER — Ambulatory Visit
Admission: RE | Admit: 2017-12-06 | Discharge: 2017-12-06 | Disposition: A | Discharge status: Home / Self Care | Payer: Medicare Other | Source: Ambulatory Visit | Attending: Family Medicine | Admitting: Family Medicine

## 2017-12-06 ENCOUNTER — Ambulatory Visit
Admission: RE | Admit: 2017-12-06 | Discharge: 2017-12-06 | Disposition: A | Discharge status: Home / Self Care | Payer: Medicare Other | Source: Ambulatory Visit | Attending: Nurse Practitioner | Admitting: Nurse Practitioner

## 2017-12-06 DIAGNOSIS — R7401 Elevation of levels of liver transaminase levels: Secondary | ICD-10-CM

## 2017-12-06 DIAGNOSIS — I700 Atherosclerosis of aorta: Secondary | ICD-10-CM | POA: Insufficient documentation

## 2017-12-06 DIAGNOSIS — R740 Nonspecific elevation of levels of transaminase and lactic acid dehydrogenase [LDH]: Secondary | ICD-10-CM | POA: Insufficient documentation

## 2017-12-06 DIAGNOSIS — J439 Emphysema, unspecified: Secondary | ICD-10-CM | POA: Insufficient documentation

## 2017-12-06 DIAGNOSIS — Z87891 Personal history of nicotine dependence: Secondary | ICD-10-CM

## 2017-12-06 DIAGNOSIS — Z122 Encounter for screening for malignant neoplasm of respiratory organs: Secondary | ICD-10-CM | POA: Diagnosis not present

## 2017-12-07 ENCOUNTER — Encounter: Payer: Self-pay | Admitting: Family Medicine

## 2017-12-07 ENCOUNTER — Encounter: Payer: Self-pay | Admitting: *Deleted

## 2017-12-07 ENCOUNTER — Ambulatory Visit (INDEPENDENT_AMBULATORY_CARE_PROVIDER_SITE_OTHER): Payer: Medicare Other | Admitting: Family Medicine

## 2017-12-07 ENCOUNTER — Ambulatory Visit: Admission: RE | Admit: 2017-12-07 | Payer: Medicare Other | Source: Ambulatory Visit

## 2017-12-07 VITALS — BP 130/60 | HR 92 | Temp 98.6°F | Ht 62.0 in | Wt 138.5 lb

## 2017-12-07 DIAGNOSIS — M19049 Primary osteoarthritis, unspecified hand: Secondary | ICD-10-CM

## 2017-12-07 DIAGNOSIS — M65332 Trigger finger, left middle finger: Secondary | ICD-10-CM

## 2017-12-07 DIAGNOSIS — M65331 Trigger finger, right middle finger: Secondary | ICD-10-CM

## 2017-12-07 MED ORDER — METHYLPREDNISOLONE ACETATE 40 MG/ML IJ SUSP
20.0000 mg | Freq: Once | INTRAMUSCULAR | Status: AC
Start: 2017-12-07 — End: 2017-12-07
  Administered 2017-12-07: 20 mg via INTRA_ARTICULAR

## 2017-12-07 MED ORDER — METHYLPREDNISOLONE ACETATE 40 MG/ML IJ SUSP
20.0000 mg | Freq: Once | INTRAMUSCULAR | Status: AC
  Administered 2017-12-07: 20 mg via INTRA_ARTICULAR

## 2017-12-07 NOTE — Progress Notes (Signed)
Dr. Frederico Hamman T. Jerid Catherman, MD, [facility] [place], [address] Phone: [phone] Fax: [phone]  12/07/2017  Patient: Cheryl Bird, MRN: [id number], DOB: May 05, 1945, 72 y.o.  Primary Physician:  Ria Bush, MD   Chief Complaint  Patient presents with  . Hand Pain    Bilateral ? Trigger Finger-both middle fingers   Subjective:   Cheryl Bird is a 72 y.o. very pleasant female patient who presents with Cheryl following:  B hand pain: R and L 3rd trigger fingers  10/02/2017 had trigger finger injection on Cheryl left. She saw Dr. Fredna Dow at [facility] in [place].   She is still having some persistent triggering and pain in both of her third fingers now.  She also has some pain in her knuckles relatively diffusely in both hands.  B 3rd digit trigger finger injections  Past Medical History, Surgical History, Social History, Family History, Problem List, Medications, and Allergies have been reviewed and updated if relevant.  Patient Active Problem List   Diagnosis Date Noted  . Right hand pain 10/15/2017  . Hyperkalemia 09/19/2017  . TIA (transient ischemic attack) 08/29/2017  . Heart murmur, systolic 93/26/7124  . Transaminitis 06/05/2017  . CKD stage 3 due to type 2 diabetes mellitus ([place]) 06/05/2017  . CAD (coronary artery disease) 12/10/2016  . Thoracic aortic atherosclerosis ([place]) 12/10/2016  . Carotid stenosis 06/04/2016  . Background diabetic retinopathy ([place]) 02/01/2016  . Urinary incontinence, mixed 11/30/2015  . Health maintenance examination 06/02/2015  . Osteopenia 05/27/2015  . Advanced care planning/counseling discussion 05/28/2014  . Abnormal TSH 05/28/2014  . Rhinorrhea 01/09/2014  . COPD, moderate ([place]) 02/12/2013  . HTN (hypertension)   . GERD (gastroesophageal reflux disease)   . Barrett esophagus 02/24/2012  . Medicare annual wellness visit, subsequent 12/24/2011  . Diabetes  mellitus type 2, uncontrolled, with complications ([place])   . HLD (hyperlipidemia)   . Ex-smoker     Past Medical History:  Diagnosis Date  . Adenomatous rectal polyp 09/2004   Nicolasa Ducking)  . Background diabetic retinopathy ([place]) 01/2016  . Barrett esophagus 02/2012   on EGD biopsy Gustavo Lah)  . Carotid stenosis 06/04/2016   40-59% RICA stenosis. 5-80% LICA stenosis. Repeat yearly (06/2016)  . COPD, moderate ([place]) 02/12/2013   Spirometry - 01/2013: FEV1 = 59%, post alb 78%, ratio 0.6, mod severe obstruction, good response to bronchodilator   . Diabetes type 2, controlled ([place])    DMSE @ [facility] 11/2011  . Diverticulosis 09/2004   by colonoscopy  . Ex-smoker 01/2013  . GERD (gastroesophageal reflux disease)   . History of cleft palate   . HLD (hyperlipidemia)   . HTN (hypertension)    dx per prior records  . Osteopenia 05/2015   T -1.4 femur, foreamr -0.7  . Right-sided sensorineural hearing loss    from infection as child, hsa been seen by Dr. Pryor Ochoa    Past Surgical History:  Procedure Laterality Date  . BLADDER SUSPENSION    . CATARACT EXTRACTION  2013   Right  . COLONOSCOPY  10/08/2004   rectal polyp (tubular adenoma), diverticulosis - Dr. Nicolasa Ducking  . COLONOSCOPY  02/2012   multiple polyps - tubular and sessile adenomas, diverticulosis, decreased rectal tone ([place]) rec rpt 3 yrs  . COLONOSCOPY  2017   stool present, to reschedule Gustavo Lah)  . COLONOSCOPY WITH PROPOFOL N/A 12/21/2015   Procedure: COLONOSCOPY WITH PROPOFOL;  Surgeon: Lollie Sails, MD;  [facility];  Service: Endoscopy;  Laterality: N/A;  . COLONOSCOPY WITH PROPOFOL N/A 05/30/2016   TAx3, rpt 3 yrs Lollie Sails, MD)  . ESOPHAGOGASTRODUODENOSCOPY  02/2012   irregular z line (+barretts), normal stomach and duodenum, rpt EGD 1 yr  . ESOPHAGOGASTRODUODENOSCOPY  05/2013   irregular z line (+barretts), HH, reflux gastroesophagitis, rpt 2 yrs  . ESOPHAGOGASTRODUODENOSCOPY  11/2015   chronic  gastritis, reflux esophagitis, + barrett's, + H pylori, rpt 2 yrs ([place])  . ESOPHAGOGASTRODUODENOSCOPY (EGD) WITH PROPOFOL N/A 12/21/2015   Procedure: ESOPHAGOGASTRODUODENOSCOPY (EGD) WITH PROPOFOL;  Surgeon: Lollie Sails, MD;  Location: [facility] ENDOSCOPY;  Service: Endoscopy;  Laterality: N/A;  . EYE SURGERY    . TONSILLECTOMY AND ADENOIDECTOMY    . TUBAL LIGATION      Social History   Socioeconomic History  . Marital status: Single    Spouse name: Not on file  . Number of children: Not on file  . Years of education: Not on file  . Highest education level: Not on file  Occupational History  . Not on file  Social Needs  . Financial resource strain: Not on file  . Food insecurity:    Worry: Not on file    Inability: Not on file  . Transportation needs:    Medical: Not on file    Non-medical: Not on file  Tobacco Use  . Smoking status: Current Some Day Smoker    Packs/day: 1.00    Years: 56.00    Pack years: 56.00    Types: Cigarettes    Start date: 04/25/1968  . Smokeless tobacco: Never Used  . Tobacco comment: smokes 1 pack week currently  Substance and Sexual Activity  . Alcohol use: No  . Drug use: No  . Sexual activity: Never  Lifestyle  . Physical activity:    Days per week: Not on file    Minutes per session: Not on file  . Stress: Not on file  Relationships  . Social connections:    Talks on phone: Not on file    Gets together: Not on file    Attends religious service: Not on file    Active member of club or organization: Not on file    Attends meetings of clubs or organizations: Not on file    Relationship status: Not on file  . Intimate partner violence:    Fear of current or ex partner: Not on file    Emotionally abused: Not on file    Physically abused: Not on file    Forced sexual activity: Not on file  Other Topics Concern  . Not on file  Social History Narrative   Caffeine: 2 cups coffee/day   Grew up in [place]   Occupation: retired    Edu: HS   Activity: walks daily 25min   Diet: good water, fruits/vegetables daily      Advanced directives: pt aware of this. Would want HCPOA to be daughter. Son in Sports coach is Therapist, sports of finances. Does want to stay on life support if reversible, otherwise doesn't want prolonged life support. Has spoken with daughter/son in law about this. Has living will/advanced directive at home but it's not notarized.    Family History  Problem Relation Age of Onset  . Diabetes Maternal Aunt   . Alcohol abuse Mother   . Heart disease Mother        enlarged heart  . Kidney failure Mother   . Hypertension Mother   . Coronary artery disease Father 79  .  Coronary artery disease Brother 4  . Cancer Sister        kidney   . Stroke Neg Hx   . Breast cancer Neg Hx     Allergies  Allergen Reactions  . Aleve [Naproxen Sodium] Itching    Medication list reviewed and updated in full in [place].  GEN: No fevers, chills. Nontoxic. Primarily MSK c/o today. MSK: Detailed in Cheryl HPI GI: tolerating PO intake without difficulty Neuro: No numbness, parasthesias, or tingling associated. Otherwise Cheryl pertinent positives of Cheryl ROS are noted above.   Objective:   BP 130/60   Pulse 92   Temp 98.6 F (37 C) (Oral)   Ht 5\' 2"  (1.575 m)   Wt 138 lb 8 oz (62.8 kg)   BMI 25.33 kg/m    GEN: WDWN, NAD, Non-toxic, Alert & Oriented x 3 HEENT: Atraumatic, Normocephalic.  Ears and Nose: No external deformity. EXTR: No clubbing/cyanosis/edema NEURO: Normal gait.  PSYCH: Normally interactive. Conversant. Not depressed or anxious appearing.  Calm demeanor.    At Cheryl third digit on both hands Cheryl patient does have a palpable nodule just proximal to Cheryl A1 pulley.  She also has some intermittent Heberden's nodes as well as some other classic osteoarthritic changes of Cheryl hand.  She has some minor restriction of motion.  Radiology:  Assessment and Plan:   Trigger finger, left middle finger - Plan:  methylPREDNISolone acetate (DEPO-MEDROL) injection 20 mg  Trigger finger, right middle finger - Plan: methylPREDNISolone acetate (DEPO-MEDROL) injection 20 mg  Hand arthritis  I reviewed with Cheryl Bird Cheryl structures involved and how they related to their diagnosis .  We discussed Cheryl pathophysiology of trigger fingers. Discussed Cheryl inflammatory nature of nodule creation and likely nodule abutting Cheryl A1 pulley system, this causing Cheryl patient's discomfort and sensations. We discussed that treatments for this include direct injection into Cheryl tendon sheath to attempt to shrink catching tissue. This can be done 1-2 times. Other treatments include surgical release. If Cheryl patient fails to trigger finger injections, I would recommend trigger finger release if Cheryl patient desires relief of Cheryl symptoms.  Trigger Finger Injection, R Verbal consent was obtained. Risks (including rare risk of infection, potential risk for skin lightening and potential atrophy), benefits and alternatives were discussed. Prepped with Chloraprep and Ethyl Chloride used for anesthesia. Under sterile conditions, patient injected at palmar crease aiming distally with 45 degree angle towards nodule; injected directly into tendon sheath. Medication flowed freely without resistance.  Needle size: 22 gauge 1 1/2 inch Injection: 1/2 cc of Lidocaine 1% and Depo-Medrol 20 mg   Trigger Finger Injection, L Verbal consent was obtained. Risks (including rare risk of infection, potential risk for skin lightening and potential atrophy), benefits and alternatives were discussed. Prepped with Chloraprep and Ethyl Chloride used for anesthesia. Under sterile conditions, patient injected at palmar crease aiming distally with 45 degree angle towards nodule; injected directly into tendon sheath. Medication flowed freely without resistance.  Needle size: 22 gauge 1 1/2 inch Injection: 1/2 cc of Lidocaine 1% and Depo-Medrol 20 mg    Follow-up:  prn only based on symptoms  Meds ordered this encounter  Medications  . methylPREDNISolone acetate (DEPO-MEDROL) injection 20 mg  . methylPREDNISolone acetate (DEPO-MEDROL) injection 20 mg   Signed,  Braedan Meuth T. Naly Schwanz, MD   Allergies as of 12/07/2017      Reactions   Aleve [naproxen Sodium] Itching      Medication List  Accurate as of 12/07/17  5:45 PM. Always use your most recent med list.          albuterol 108 (90 Base) MCG/ACT inhaler Commonly known as:  PROVENTIL HFA;VENTOLIN HFA Inhale 2 puffs into Cheryl lungs every 6 (six) hours as needed for wheezing.   amLODipine 5 MG tablet Commonly known as:  NORVASC Take 1 tablet (5 mg total) by mouth daily.   aspirin EC 81 MG tablet Take 1 tablet (81 mg total) by mouth every other day.   atorvastatin 20 MG tablet Commonly known as:  LIPITOR TAKE 1 TABLET (20 MG TOTAL) BY MOUTH NIGHTLY   Calcium Carbonate-Vitamin D 600-400 MG-UNIT chew tablet Chew 1 tablet by mouth daily.   lisinopril 2.5 MG tablet Commonly known as:  PRINIVIL,ZESTRIL Take 1 tablet (2.5 mg total) by mouth daily.   metFORMIN 1000 MG tablet Commonly known as:  GLUCOPHAGE Take 1 tablet (1,000 mg total) by mouth 2 (two) times daily with a meal.   oxybutynin 5 MG tablet Commonly known as:  DITROPAN TAKE 1 TABLET (5 MG TOTAL) BY MOUTH 2 (TWO) TIMES DAILY AS NEEDED (INCONTINENCE).   pantoprazole 40 MG tablet Commonly known as:  PROTONIX Take 1 tablet (40 mg total) by mouth every other day.   sitaGLIPtin 50 MG tablet Commonly known as:  JANUVIA TAKE 1 TABLET (50 MG TOTAL) BY MOUTH DAILY.   Vitamin D3 1000 units Caps Take 2 capsules (2,000 Units total) by mouth daily.

## 2017-12-13 ENCOUNTER — Encounter: Payer: Self-pay | Admitting: *Deleted

## 2017-12-20 ENCOUNTER — Other Ambulatory Visit: Payer: Self-pay

## 2017-12-20 MED ORDER — AMLODIPINE BESYLATE 5 MG PO TABS: 5.0000 mg | ORAL_TABLET | Freq: Every day | ORAL | 3 refills | Status: DC

## 2017-12-20 NOTE — Telephone Encounter (Signed)
Sent refill

## 2017-12-27 ENCOUNTER — Encounter: Payer: Self-pay | Admitting: Family Medicine

## 2017-12-27 DIAGNOSIS — J439 Emphysema, unspecified: Secondary | ICD-10-CM | POA: Insufficient documentation

## 2018-02-02 DIAGNOSIS — R945 Abnormal results of liver function studies: Secondary | ICD-10-CM | POA: Diagnosis not present

## 2018-02-02 DIAGNOSIS — K2270 Barrett's esophagus without dysplasia: Secondary | ICD-10-CM | POA: Diagnosis not present

## 2018-02-08 DIAGNOSIS — R945 Abnormal results of liver function studies: Secondary | ICD-10-CM | POA: Diagnosis not present

## 2018-03-06 ENCOUNTER — Ambulatory Visit (INDEPENDENT_AMBULATORY_CARE_PROVIDER_SITE_OTHER): Payer: Medicare Other | Admitting: Family Medicine

## 2018-03-06 ENCOUNTER — Encounter: Payer: Self-pay | Admitting: Family Medicine

## 2018-03-06 VITALS — BP 126/76 | HR 96 | Temp 97.8°F | Ht 62.0 in | Wt 134.2 lb

## 2018-03-06 DIAGNOSIS — I10 Essential (primary) hypertension: Secondary | ICD-10-CM

## 2018-03-06 DIAGNOSIS — R740 Nonspecific elevation of levels of transaminase and lactic acid dehydrogenase [LDH]: Secondary | ICD-10-CM

## 2018-03-06 DIAGNOSIS — Z23 Encounter for immunization: Secondary | ICD-10-CM

## 2018-03-06 DIAGNOSIS — K2270 Barrett's esophagus without dysplasia: Secondary | ICD-10-CM

## 2018-03-06 DIAGNOSIS — Z87891 Personal history of nicotine dependence: Secondary | ICD-10-CM

## 2018-03-06 DIAGNOSIS — K2100 Gastro-esophageal reflux disease with esophagitis, without bleeding: Secondary | ICD-10-CM

## 2018-03-06 DIAGNOSIS — N183 Chronic kidney disease, stage 3 (moderate): Secondary | ICD-10-CM

## 2018-03-06 DIAGNOSIS — E118 Type 2 diabetes mellitus with unspecified complications: Secondary | ICD-10-CM | POA: Diagnosis not present

## 2018-03-06 DIAGNOSIS — J432 Centrilobular emphysema: Secondary | ICD-10-CM

## 2018-03-06 DIAGNOSIS — L84 Corns and callosities: Secondary | ICD-10-CM

## 2018-03-06 DIAGNOSIS — E1122 Type 2 diabetes mellitus with diabetic chronic kidney disease: Secondary | ICD-10-CM

## 2018-03-06 DIAGNOSIS — E113299 Type 2 diabetes mellitus with mild nonproliferative diabetic retinopathy without macular edema, unspecified eye: Secondary | ICD-10-CM

## 2018-03-06 DIAGNOSIS — R7401 Elevation of levels of liver transaminase levels: Secondary | ICD-10-CM

## 2018-03-06 LAB — POCT GLYCOSYLATED HEMOGLOBIN (HGB A1C): HEMOGLOBIN A1C: 6.6 % — AB (ref 4.0–5.6)

## 2018-03-06 LAB — RENAL FUNCTION PANEL
ALBUMIN: 4 g/dL (ref 3.5–5.2)
BUN: 20 mg/dL (ref 6–23)
CO2: 27 mEq/L (ref 19–32)
Calcium: 9.7 mg/dL (ref 8.4–10.5)
Chloride: 103 mEq/L (ref 96–112)
Creatinine, Ser: 1.01 mg/dL (ref 0.40–1.20)
GFR: 57.18 mL/min — ABNORMAL LOW (ref >60.00)
Glucose, Bld: 161 mg/dL — ABNORMAL HIGH (ref 70–99)
Phosphorus: 3.8 mg/dL (ref 2.3–4.6)
Potassium: 4.5 mEq/L (ref 3.5–5.1)
SODIUM: 139 meq/L (ref 135–145)

## 2018-03-06 NOTE — Progress Notes (Signed)
BP 126/76 (BP Location: Left Arm, Patient Position: Sitting, Cuff Size: Normal)   Pulse 96   Temp 97.8 F (36.6 C) (Oral)   Ht 5\' 2"  (1.575 m)   Wt 134 lb 4 oz (60.9 kg)   SpO2 94%   BMI 24.55 kg/m    CC: 3 mo f/u visit Subjective:    Patient ID: Cheryl Bird, female    DOB: Jul 22, 1945, 72 y.o.   MRN: [id number]  HPI: CADYN Bird is a 72 y.o. female presenting on 03/06/2018 for Follow-up (Here for 3 mo f/u.)   Just became new great-grandmother!  Increased stress over last 3 weeks - sister suffering from kidney cancer on dialysis. Quit smoking 08/2017, but increased stress has led to relapse in cigarettes (some day smoking).   Barrett's - upcoming EGD next month Gustavo Lah)  DM - does regularly check sugars and endorses well controlled - fasting <120, post dinner 130s. Compliant with antihyperglycemic regimen which includes: metformin 1000mg  bid and januvia 50mg  daily. Denies low sugars or hypoglycemic symptoms. Denies paresthesias. Last diabetic eye exam 01/2017, due, scheduled later this month. Pneumovax: 2013. Prevnar: 2015. Glucometer brand: unsure ?accu-check. DSME: completed remotely. Lab Results  Component Value Date   HGBA1C 6.6 (A) 03/06/2018   Diabetic Foot Exam - Simple   Simple Foot Form Diabetic Foot exam was performed with the following findings:  Yes 03/06/2018 11:40 AM  Visual Inspection See comments:  Yes Sensation Testing Intact to touch and monofilament testing bilaterally:  Yes Pulse Check Posterior Tibialis and Dorsalis pulse intact bilaterally:  Yes Comments 2+ DP bilaterally Corn lateral R pinky toe, tender    Lab Results  Component Value Date   MICROALBUR 61.0 (H) 11/29/2017     Relevant past medical, surgical, family and social history reviewed and updated as indicated. Interim medical history since our last visit reviewed. Allergies and medications reviewed and updated. Outpatient Medications Prior to Visit  Medication Sig Dispense  Refill  . albuterol (PROVENTIL HFA;VENTOLIN HFA) 108 (90 BASE) MCG/ACT inhaler Inhale 2 puffs into the lungs every 6 (six) hours as needed for wheezing. 1 Inhaler 3  . amLODipine (NORVASC) 5 MG tablet Take 1 tablet (5 mg total) by mouth daily. 30 tablet 3  . aspirin EC 81 MG tablet Take 1 tablet (81 mg total) by mouth every other day.    [place] Kitchen atorvastatin (LIPITOR) 20 MG tablet TAKE 1 TABLET (20 MG TOTAL) BY MOUTH NIGHTLY 90 tablet 3  . Calcium Carbonate-Vitamin D (CALCIUM 600/VITAMIN D) 600-400 MG-UNIT chew tablet Chew 1 tablet by mouth daily.    . Cholecalciferol (VITAMIN D3) 1000 units CAPS Take 2 capsules (2,000 Units total) by mouth daily. 60 capsule   . lisinopril (ZESTRIL) 2.5 MG tablet Take 1 tablet (2.5 mg total) by mouth daily. 30 tablet 6  . metFORMIN (GLUCOPHAGE) 1000 MG tablet Take 1 tablet (1,000 mg total) by mouth 2 (two) times daily with a meal. 180 tablet 3  . oxybutynin (DITROPAN) 5 MG tablet TAKE 1 TABLET (5 MG TOTAL) BY MOUTH 2 (TWO) TIMES DAILY AS NEEDED (INCONTINENCE). 60 tablet 6  . pantoprazole (PROTONIX) 40 MG tablet Take 1 tablet (40 mg total) by mouth every other day. 45 tablet 3  . sitaGLIPtin (JANUVIA) 50 MG tablet TAKE 1 TABLET (50 MG TOTAL) BY MOUTH DAILY. 90 tablet 3   No facility-administered medications prior to visit.      Per HPI unless specifically indicated in ROS section below Review of Systems  Objective:    BP 126/76 (BP Location: Left Arm, Patient Position: Sitting, Cuff Size: Normal)   Pulse 96   Temp 97.8 F (36.6 C) (Oral)   Ht 5\' 2"  (1.575 m)   Wt 134 lb 4 oz (60.9 kg)   SpO2 94%   BMI 24.55 kg/m   Wt Readings from Last 3 Encounters:  03/06/18 134 lb 4 oz (60.9 kg)  12/07/17 138 lb 8 oz (62.8 kg)  12/06/17 138 lb 12.8 oz (63 kg)    Physical Exam  Constitutional: She appears well-developed and well-nourished. No distress.  HENT:  Head: Normocephalic and atraumatic.  Right Ear: External ear normal.  Left Ear: External ear normal.    Nose: Nose normal.  Mouth/Throat: Oropharynx is clear and moist. No oropharyngeal exudate.  Eyes: Pupils are equal, round, and reactive to light. Conjunctivae and EOM are normal. No scleral icterus.  Neck: Normal range of motion. Neck supple.  Cardiovascular: Normal rate, regular rhythm and intact distal pulses.  Murmur (2/6 systolic murmur) heard. Pulmonary/Chest: Effort normal and breath sounds normal. No respiratory distress. She has no wheezes. She has no rales.  Musculoskeletal: She exhibits no edema.  See HPI for foot exam if done  Lymphadenopathy:    She has no cervical adenopathy.  Skin: Skin is warm and dry. No rash noted.  Psychiatric: She has a normal mood and affect.  Nursing note and vitals reviewed.  Results for orders placed or performed in visit on 03/06/18  POCT glycosylated hemoglobin (Hb A1C)  Result Value Ref Range   Hemoglobin A1C 6.6 (A) 4.0 - 5.6 %   HbA1c POC (<> result, manual entry)     HbA1c, POC (prediabetic range)     HbA1c, POC (controlled diabetic range)     Lab Results  Component Value Date   CREATININE 1.10 11/29/2017   BUN 19 11/29/2017   NA 141 11/29/2017   K 4.3 11/29/2017   CL 104 11/29/2017   CO2 29 11/29/2017   Abd US - WNL, normal kidneys and liver.    Assessment & Plan:   Problem List Items Addressed This Visit    Transaminitis    Recent abd US WNL.  No signs of cirrhosis.       HTN (hypertension)    Chronic, stable. Continue current regimen. Low dose lisinopril added last visit. Update Cr       GERD (gastroesophageal reflux disease)    Continue QOD PPI. Upcoming EGD next month with [facility]      Ex-smoker    Has relapsed to some day smoking. Encouraged full cessation.       Emphysema of lung (HCC)   Corn of toe    Discussed careful pumice stone use in diabetic.  rec regular moisturizer use      Controlled diabetes mellitus type 2 with complications ([place]) - Primary    Chronic, improved control based on today's A1c.  Congratulated. Continue current regimen. Foot exam today. Update Cr. She has eye exam scheduled for later this month       Relevant Orders   POCT glycosylated hemoglobin (Hb A1C) (Completed)   Renal function panel   CKD stage 3 due to type 2 diabetes mellitus ([place])    Update Cr with addition of low dose lisinopril.       Barrett esophagus    Rpt EGD planned next month.  Continue QOD PPI.       Background diabetic retinopathy ([place])    Other Visit Diagnoses  Need for influenza vaccination       Relevant Orders   Flu Vaccine QUAD 36+ mos IM (Completed)       No orders of the defined types were placed in this encounter.  Orders Placed This Encounter  Procedures  . Flu Vaccine QUAD 36+ mos IM  . Renal function panel  . POCT glycosylated hemoglobin (Hb A1C)    Follow up plan: Return in about 3 months (around 06/06/2018) for annual exam, prior fasting for blood work, medicare wellness visit.  Ria Bush, MD

## 2018-03-06 NOTE — Assessment & Plan Note (Addendum)
Rpt EGD planned next month.  Continue QOD PPI.

## 2018-03-06 NOTE — Assessment & Plan Note (Addendum)
Discussed careful pumice stone use in diabetic.  rec regular moisturizer use

## 2018-03-06 NOTE — Assessment & Plan Note (Addendum)
Recent abd US WNL.  No signs of cirrhosis.

## 2018-03-06 NOTE — Assessment & Plan Note (Signed)
Continue QOD PPI. Upcoming EGD next month with [facility]

## 2018-03-06 NOTE — Assessment & Plan Note (Addendum)
Has relapsed to some day smoking. Encouraged full cessation.

## 2018-03-06 NOTE — Assessment & Plan Note (Signed)
Chronic, stable. Continue current regimen. Low dose lisinopril added last visit. Update Cr

## 2018-03-06 NOTE — Assessment & Plan Note (Signed)
Chronic, improved control based on today's A1c. Congratulated. Continue current regimen. Foot exam today. Update Cr. She has eye exam scheduled for later this month

## 2018-03-06 NOTE — Assessment & Plan Note (Signed)
Update Cr with addition of low dose lisinopril.

## 2018-03-06 NOTE — Patient Instructions (Addendum)
Flu shot today Labs today. A1c was doing well! Continue current medicines.  Ask eye doctor to send me copy of report to update your chart.  May try pumice stone infrequently to corn on right pinky toe, moisturize skin regularly. Use callus pads to area as well.  Return in 3-4 months for wellness visit and physical.

## 2018-03-18 ENCOUNTER — Other Ambulatory Visit: Payer: Self-pay | Admitting: Family Medicine

## 2018-03-30 ENCOUNTER — Ambulatory Visit: Payer: Medicare Other | Admitting: Anesthesiology

## 2018-03-30 ENCOUNTER — Encounter: Payer: Self-pay | Admitting: *Deleted

## 2018-03-30 ENCOUNTER — Ambulatory Visit
Admission: RE | Admit: 2018-03-30 | Discharge: 2018-03-30 | Disposition: A | Discharge status: Home / Self Care | Payer: Medicare Other | Source: Ambulatory Visit | Attending: Gastroenterology | Admitting: Gastroenterology

## 2018-03-30 ENCOUNTER — Encounter
Admission: RE | Disposition: A | Discharge status: Home / Self Care | Payer: Self-pay | Source: Ambulatory Visit | Attending: Gastroenterology

## 2018-03-30 DIAGNOSIS — K228 Other specified diseases of esophagus: Secondary | ICD-10-CM | POA: Diagnosis not present

## 2018-03-30 DIAGNOSIS — Z87891 Personal history of nicotine dependence: Secondary | ICD-10-CM | POA: Insufficient documentation

## 2018-03-30 DIAGNOSIS — I10 Essential (primary) hypertension: Secondary | ICD-10-CM | POA: Insufficient documentation

## 2018-03-30 DIAGNOSIS — I129 Hypertensive chronic kidney disease with stage 1 through stage 4 chronic kidney disease, or unspecified chronic kidney disease: Secondary | ICD-10-CM | POA: Diagnosis not present

## 2018-03-30 DIAGNOSIS — K2900 Acute gastritis without bleeding: Secondary | ICD-10-CM | POA: Diagnosis not present

## 2018-03-30 DIAGNOSIS — K2970 Gastritis, unspecified, without bleeding: Secondary | ICD-10-CM | POA: Insufficient documentation

## 2018-03-30 DIAGNOSIS — Z886 Allergy status to analgesic agent status: Secondary | ICD-10-CM | POA: Insufficient documentation

## 2018-03-30 DIAGNOSIS — J449 Chronic obstructive pulmonary disease, unspecified: Secondary | ICD-10-CM | POA: Insufficient documentation

## 2018-03-30 DIAGNOSIS — H905 Unspecified sensorineural hearing loss: Secondary | ICD-10-CM | POA: Diagnosis not present

## 2018-03-30 DIAGNOSIS — E1122 Type 2 diabetes mellitus with diabetic chronic kidney disease: Secondary | ICD-10-CM | POA: Diagnosis not present

## 2018-03-30 DIAGNOSIS — K3189 Other diseases of stomach and duodenum: Secondary | ICD-10-CM | POA: Diagnosis not present

## 2018-03-30 DIAGNOSIS — Z7982 Long term (current) use of aspirin: Secondary | ICD-10-CM | POA: Insufficient documentation

## 2018-03-30 DIAGNOSIS — K210 Gastro-esophageal reflux disease with esophagitis: Secondary | ICD-10-CM | POA: Insufficient documentation

## 2018-03-30 DIAGNOSIS — E785 Hyperlipidemia, unspecified: Secondary | ICD-10-CM | POA: Insufficient documentation

## 2018-03-30 DIAGNOSIS — K2270 Barrett's esophagus without dysplasia: Secondary | ICD-10-CM | POA: Diagnosis not present

## 2018-03-30 DIAGNOSIS — E11319 Type 2 diabetes mellitus with unspecified diabetic retinopathy without macular edema: Secondary | ICD-10-CM | POA: Insufficient documentation

## 2018-03-30 DIAGNOSIS — K2950 Unspecified chronic gastritis without bleeding: Secondary | ICD-10-CM | POA: Diagnosis not present

## 2018-03-30 DIAGNOSIS — M8580 Other specified disorders of bone density and structure, unspecified site: Secondary | ICD-10-CM | POA: Insufficient documentation

## 2018-03-30 DIAGNOSIS — Z79899 Other long term (current) drug therapy: Secondary | ICD-10-CM | POA: Insufficient documentation

## 2018-03-30 DIAGNOSIS — Z7984 Long term (current) use of oral hypoglycemic drugs: Secondary | ICD-10-CM | POA: Insufficient documentation

## 2018-03-30 HISTORY — PX: ESOPHAGOGASTRODUODENOSCOPY (EGD) WITH PROPOFOL: SHX5813

## 2018-03-30 HISTORY — DX: Benign neoplasm of colon, unspecified: D12.6

## 2018-03-30 LAB — PROTIME-INR
INR: 0.9
Prothrombin Time: 12.1 seconds (ref 11.4–15.2)

## 2018-03-30 LAB — GLUCOSE, CAPILLARY: Glucose-Capillary: 167 mg/dL — ABNORMAL HIGH (ref 70–99)

## 2018-03-30 SURGERY — ESOPHAGOGASTRODUODENOSCOPY (EGD) WITH PROPOFOL
Anesthesia: General

## 2018-03-30 MED ORDER — PROPOFOL 10 MG/ML IV BOLUS
INTRAVENOUS | Status: DC | PRN
  Administered 2018-03-30: 100 mg via INTRAVENOUS

## 2018-03-30 MED ORDER — LIDOCAINE HCL (PF) 1 % IJ SOLN
INTRAMUSCULAR | Status: AC
  Administered 2018-03-30: 0.3 mL
  Filled 2018-03-30: qty 2

## 2018-03-30 MED ORDER — PROPOFOL 500 MG/50ML IV EMUL
INTRAVENOUS | Status: DC | PRN
  Administered 2018-03-30: 170 ug/kg/min via INTRAVENOUS

## 2018-03-30 MED ORDER — PROPOFOL 500 MG/50ML IV EMUL
INTRAVENOUS | Status: AC
  Filled 2018-03-30: qty 50

## 2018-03-30 MED ORDER — LIDOCAINE 2% (20 MG/ML) 5 ML SYRINGE
INTRAMUSCULAR | Status: DC | PRN
  Administered 2018-03-30: 30 mg via INTRAVENOUS

## 2018-03-30 MED ORDER — FENTANYL CITRATE (PF) 100 MCG/2ML IJ SOLN
INTRAMUSCULAR | Status: DC | PRN
  Administered 2018-03-30: 50 ug via INTRAVENOUS

## 2018-03-30 MED ORDER — FENTANYL CITRATE (PF) 100 MCG/2ML IJ SOLN
INTRAMUSCULAR | Status: AC
  Filled 2018-03-30: qty 2

## 2018-03-30 MED ORDER — SODIUM CHLORIDE 0.9 % IV SOLN
INTRAVENOUS | Status: DC
  Administered 2018-03-30: 1000 mL via INTRAVENOUS

## 2018-03-30 MED ORDER — PHENYLEPHRINE HCL 10 MG/ML IJ SOLN
INTRAMUSCULAR | Status: DC | PRN
  Administered 2018-03-30 (×2): 100 ug via INTRAVENOUS

## 2018-03-30 NOTE — Anesthesia Postprocedure Evaluation (Signed)
Anesthesia Post Note  Patient: Cheryl Bird  Procedure(s) Performed: ESOPHAGOGASTRODUODENOSCOPY (EGD) WITH PROPOFOL (N/A )  Patient location during evaluation: Endoscopy Anesthesia Type: General Level of consciousness: awake and alert Pain management: pain level controlled Vital Signs Assessment: post-procedure vital signs reviewed and stable Respiratory status: spontaneous breathing and respiratory function stable Cardiovascular status: stable Anesthetic complications: no     Last Vitals:  Vitals:   03/30/18 1210 03/30/18 1212  BP: 121/61 121/61  Pulse: 68 67  Resp: 17 19  Temp: (!) 36.2 C (!) 36.1 C  SpO2: 96% 97%    Last Pain:  Vitals:   03/30/18 1220  TempSrc:   PainSc: 0-No pain                 KEPHART,WILLIAM K

## 2018-03-30 NOTE — Transfer of Care (Signed)
Immediate Anesthesia Transfer of Care Note  Patient: Cheryl Bird  Procedure(s) Performed: ESOPHAGOGASTRODUODENOSCOPY (EGD) WITH PROPOFOL (N/A )  Patient Location: PACU and Endoscopy Unit  Anesthesia Type:General  Level of Consciousness: sedated  Airway & Oxygen Therapy: Patient Spontanous Breathing and Patient connected to face mask oxygen  Post-op Assessment: Report given to RN and Post -op Vital signs reviewed and stable  Post vital signs: Reviewed and stable  Last Vitals:  Vitals Value Taken Time  BP 121/61 03/30/2018 12:10 PM  Temp    Pulse 68 03/30/2018 12:11 PM  Resp 17 03/30/2018 12:11 PM  SpO2 96 % 03/30/2018 12:11 PM  Vitals shown include unvalidated device data.  Last Pain:  Vitals:   03/30/18 1035  TempSrc: Tympanic         Complications: No apparent anesthesia complications

## 2018-03-30 NOTE — Anesthesia Post-op Follow-up Note (Signed)
Anesthesia QCDR form completed.        

## 2018-03-30 NOTE — H&P (Signed)
Outpatient short stay form Pre-procedure 03/30/2018 11:35 AM Lollie Sails MD  Primary Physician: Dr. Ria Bush  Reason for visit: EGD  History of present illness: Patient is a 72 year old female presenting today for follow-up on Barrett's esophagus.  Her last EGD was 12/21/2015 showing Barrett's without dysplasia.  She is continuing to take her proton pump inhibitor though rather it regularly.  We discussed the need to use this regularly.  She takes a 81 mg aspirin daily the last time yesterday.  She takes no other aspirin products or blood thinning agent.    Current Facility-Administered Medications:  .  0.9 %  sodium chloride infusion, , Intravenous, Continuous, Lollie Sails, MD, Last Rate: 20 mL/hr at 03/30/18 1123  Medications Prior to Admission  Medication Sig Dispense Refill Last Dose  . aspirin EC 81 MG tablet Take 1 tablet (81 mg total) by mouth every other day.   Past Week at Unknown time  . atorvastatin (LIPITOR) 20 MG tablet TAKE 1 TABLET (20 MG TOTAL) BY MOUTH NIGHTLY 90 tablet 3 03/29/2018 at Unknown time  . Calcium Carbonate-Vitamin D (CALCIUM 600/VITAMIN D) 600-400 MG-UNIT chew tablet Chew 1 tablet by mouth daily.   Past Week at Unknown time  . Cholecalciferol (VITAMIN D3) 1000 units CAPS Take 2 capsules (2,000 Units total) by mouth daily. 60 capsule  Past Week at Unknown time  . lisinopril (ZESTRIL) 2.5 MG tablet Take 1 tablet (2.5 mg total) by mouth daily. (Patient taking differently: Take 10 mg by mouth daily. ) 30 tablet 6 03/29/2018 at Unknown time  . metFORMIN (GLUCOPHAGE) 1000 MG tablet Take 1 tablet (1,000 mg total) by mouth 2 (two) times daily with a meal. 180 tablet 3 03/29/2018 at Unknown time  . sitaGLIPtin (JANUVIA) 50 MG tablet TAKE 1 TABLET (50 MG TOTAL) BY MOUTH DAILY. 90 tablet 3 03/29/2018 at Unknown time  . albuterol (PROVENTIL HFA;VENTOLIN HFA) 108 (90 BASE) MCG/ACT inhaler Inhale 2 puffs into the lungs every 6 (six) hours as needed for wheezing.  1 Inhaler 3  at prn  . oxybutynin (DITROPAN) 5 MG tablet TAKE 1 TABLET (5 MG TOTAL) BY MOUTH 2 (TWO) TIMES DAILY AS NEEDED (INCONTINENCE). 60 tablet 6  at prn  . pantoprazole (PROTONIX) 40 MG tablet Take 1 tablet (40 mg total) by mouth every other day. 45 tablet 3  at prn     Allergies  Allergen Reactions  . Aleve [Naproxen Sodium] Itching     Past Medical History:  Diagnosis Date  . Adenomatous rectal polyp 09/2004   Nicolasa Ducking)  . Background diabetic retinopathy ([place]) 01/2016  . Barrett esophagus 02/2012   on EGD biopsy Gustavo Lah)  . Carotid stenosis 06/04/2016   40-59% RICA stenosis. 0-62% LICA stenosis. Repeat yearly (06/2016)  . COPD, moderate ([place]) 02/12/2013   Spirometry - 01/2013: FEV1 = 59%, post alb 78%, ratio 0.6, mod severe obstruction, good response to bronchodilator   . Diabetes type 2, controlled ([place])    DMSE @ [facility] 11/2011  . Diverticulosis 09/2004   by colonoscopy  . Ex-smoker 01/2013  . GERD (gastroesophageal reflux disease)   . History of cleft palate   . HLD (hyperlipidemia)   . HTN (hypertension)    dx per prior records  . Osteopenia 05/2015   T -1.4 femur, foreamr -0.7  . Right-sided sensorineural hearing loss    from infection as child, hsa been seen by Dr. Pryor Ochoa  . Tubular adenoma of colon     Review of systems:  Physical Exam    Heart and lungs: Regular rate and rhythm without rub or gallop, lungs are bilaterally clear.    HEENT: Normocephalic atraumatic eyes anicteric    Other:    Pertinant exam for procedure: Soft nontender nondistended bowel sounds positive normoactive.    Planned proceedures: EGD and indicated procedures. I have discussed the risks benefits and complications of procedures to include not limited to bleeding, infection, perforation and the risk of sedation and the patient wishes to proceed.    Lollie Sails, MD Gastroenterology 03/30/2018  11:35 AM

## 2018-03-30 NOTE — Op Note (Signed)
[facility] Gastroenterology Patient Name: Cheryl Bird Procedure Date: 03/30/2018 11:30 AM MRN: [id number] Account #: [id number] Date of Birth: 06-19-1945 Admit Type: Outpatient Age: 72 Room: [facility] ENDO ROOM 3 Gender: Female Note Status: Finalized Procedure:            Upper GI endoscopy Indications:          Follow-up of Barrett's esophagus Providers:            Lollie Sails, MD Referring MD:         Ria Bush (Referring MD) Medicines:            Monitored Anesthesia Care Complications:        No immediate complications. Procedure:            Pre-Anesthesia Assessment:                       - ASA Grade Assessment: III - A patient with severe                        systemic disease.                       After obtaining informed consent, the endoscope was                        passed under direct vision. Throughout the procedure,                        the patient's blood pressure, pulse, and oxygen                        saturations were monitored continuously. The Endoscope                        was introduced through the mouth, and advanced to the                        third part of duodenum. The upper GI endoscopy was                        accomplished without difficulty. The patient tolerated                        the procedure well. Findings:      There were esophageal mucosal changes consistent with short-segment       Barrett's esophagus present in the lower third of the esophagus. The       maximum longitudinal extent of these mucosal changes was 1 cm in length.       Mucosa was biopsied with a cold forceps for histology in 4 quadrants at       35 cm from the incisors. One specimen bottle was sent to pathology.      The exam of the esophagus was otherwise normal.      Diffuse and patchy mild inflammation characterized by adherent blood,       congestion (edema) and erythema was found in the gastric body and in the       gastric  antrum. Biopsies were taken with a cold forceps for histology.       Biopsies were taken with a cold forceps for Helicobacter pylori testing.  The examined duodenum was normal.      The cardia and gastric fundus were normal on retroflexion. Impression:           - Esophageal mucosal changes consistent with                        short-segment Barrett's esophagus. Biopsied.                       - Gastritis. Biopsied.                       - Normal examined duodenum. Recommendation:       - Await pathology results.                       - Continue present medications.                       - Telephone GI clinic for pathology results in 1 week. Procedure Code(s):    --- Professional ---                       [id number], Esophagogastroduodenoscopy, flexible, transoral;                        with biopsy, single or multiple Diagnosis Code(s):    --- Professional ---                       K22.70, Barrett's esophagus without dysplasia                       K29.70, Gastritis, unspecified, without bleeding CPT copyright 2018 American Medical Association. All rights reserved. The codes documented in this report are preliminary and upon coder review may  be revised to meet current compliance requirements. Lollie Sails, MD 03/30/2018 12:03:50 PM This report has been signed electronically. Number of Addenda: 0 Note Initiated On: 03/30/2018 11:30 AM      [facility]

## 2018-03-30 NOTE — Anesthesia Preprocedure Evaluation (Signed)
Anesthesia Evaluation  Patient identified by MRN, date of birth, ID band Patient awake    Reviewed: Allergy & Precautions, NPO status , Patient's Chart, lab work & pertinent test results  History of Anesthesia Complications Negative for: history of anesthetic complications  Airway Mallampati: III       Dental  (+) Partial Lower, Upper Dentures   Pulmonary neg sleep apnea, COPD,  COPD inhaler, Current Smoker,           Cardiovascular hypertension, Pt. on medications (-) Past MI and (-) CHF (-) dysrhythmias + Valvular Problems/Murmurs (murmur, no tx)      Neuro/Psych neg Seizures TIA   GI/Hepatic Neg liver ROS, GERD  Medicated,  Endo/Other  diabetes, Type 2, Oral Hypoglycemic Agents  Renal/GU Renal InsufficiencyRenal disease     Musculoskeletal   Abdominal   Peds  Hematology   Anesthesia Other Findings   Reproductive/Obstetrics                             Anesthesia Physical Anesthesia Plan  ASA: III  Anesthesia Plan: General   Post-op Pain Management:    Induction: Intravenous  PONV Risk Score and Plan: 2 and Propofol infusion and TIVA  Airway Management Planned: Nasal Cannula  Additional Equipment:   Intra-op Plan:   Post-operative Plan:   Informed Consent: I have reviewed the patients History and Physical, chart, labs and discussed the procedure including the risks, benefits and alternatives for the proposed anesthesia with the patient or authorized representative who has indicated his/her understanding and acceptance.     Plan Discussed with:   Anesthesia Plan Comments:         Anesthesia Quick Evaluation

## 2018-04-02 ENCOUNTER — Encounter: Payer: Self-pay | Admitting: Gastroenterology

## 2018-04-03 LAB — SURGICAL PATHOLOGY

## 2018-04-05 ENCOUNTER — Encounter: Payer: Self-pay | Admitting: Family Medicine

## 2018-04-19 DIAGNOSIS — E113293 Type 2 diabetes mellitus with mild nonproliferative diabetic retinopathy without macular edema, bilateral: Secondary | ICD-10-CM | POA: Diagnosis not present

## 2018-04-19 LAB — HM DIABETES EYE EXAM

## 2018-04-20 ENCOUNTER — Encounter: Payer: Self-pay | Admitting: Family Medicine

## 2018-05-01 DIAGNOSIS — R0981 Nasal congestion: Secondary | ICD-10-CM | POA: Diagnosis not present

## 2018-05-01 DIAGNOSIS — R945 Abnormal results of liver function studies: Secondary | ICD-10-CM | POA: Diagnosis not present

## 2018-05-01 DIAGNOSIS — K2270 Barrett's esophagus without dysplasia: Secondary | ICD-10-CM | POA: Diagnosis not present

## 2018-05-03 ENCOUNTER — Encounter: Payer: Self-pay | Admitting: Family Medicine

## 2018-05-03 ENCOUNTER — Ambulatory Visit (INDEPENDENT_AMBULATORY_CARE_PROVIDER_SITE_OTHER): Payer: Medicare Other | Admitting: Family Medicine

## 2018-05-03 VITALS — BP 136/78 | HR 98 | Temp 97.8°F | Ht 62.0 in | Wt 130.0 lb

## 2018-05-03 DIAGNOSIS — H6593 Unspecified nonsuppurative otitis media, bilateral: Secondary | ICD-10-CM | POA: Insufficient documentation

## 2018-05-03 DIAGNOSIS — H6503 Acute serous otitis media, bilateral: Secondary | ICD-10-CM

## 2018-05-03 DIAGNOSIS — J019 Acute sinusitis, unspecified: Secondary | ICD-10-CM | POA: Diagnosis not present

## 2018-05-03 MED ORDER — FLUTICASONE PROPIONATE 50 MCG/ACT NA SUSP: 2.0000 | Freq: Every day | NASAL | 1 refills | Status: DC

## 2018-05-03 MED ORDER — AMOXICILLIN-POT CLAVULANATE 875-125 MG PO TABS: 1.0000 | ORAL_TABLET | Freq: Two times a day (BID) | ORAL | 0 refills | Status: AC

## 2018-05-03 NOTE — Assessment & Plan Note (Signed)
Will treat for bacterial infection given duration and progression of symptoms. augmentin 7d course. Update with effect. Add flonase

## 2018-05-03 NOTE — Patient Instructions (Addendum)
I think you have a sinus infection and fluid in the ears.  Treat with augmentin course, take flonase nasal spray.  If not improving, let me know for referral to ear doctor.

## 2018-05-03 NOTE — Assessment & Plan Note (Signed)
Start flonase. Update with effect. Known R hearing loss, now with new L sided hearing loss. Consider ENT eval if not improving with treatment.

## 2018-05-03 NOTE — Progress Notes (Signed)
BP 136/78 (BP Location: Left Arm, Patient Position: Sitting, Cuff Size: Normal)   Pulse 98   Temp 97.8 F (36.6 C) (Oral)   Ht 5\' 2"  (1.575 m)   Wt 130 lb (59 kg)   SpO2 95%   BMI 23.78 kg/m    CC: URI? Subjective:    Patient ID: Cheryl Bird, female    DOB: [birth year], 73 y.o.   MRN: [id number]  HPI: Cheryl Bird is a 73 y.o. female presenting on 05/03/2018 for Sinus Problem (C/o sinus pressure, drainage, bilateral ear pressure and loss of appetite. Sxs started 2 wks ago. )   2 wk h/o sinus congestion, muffled hearing with significant bilateral decrease in hearing, "all stopped up". Ears popping. Some dizziness. Mild productive cough. No appetite.   No fevers/chills, ear or tooth pain, ST.   Known nerve deafness of R ear (h/o mastoiditis age 61yo) She has previously seen Dr Pryor Ochoa  No known h/o asthma.  No sick contacts at home.  Current smoker <1 ppd.  Lab Results  Component Value Date   HGBA1C 6.6 (A) 03/06/2018        Relevant past medical, surgical, family and social history reviewed and updated as indicated. Interim medical history since our last visit reviewed. Allergies and medications reviewed and updated. Outpatient Medications Prior to Visit  Medication Sig Dispense Refill  . albuterol (PROVENTIL HFA;VENTOLIN HFA) 108 (90 BASE) MCG/ACT inhaler Inhale 2 puffs into the lungs every 6 (six) hours as needed for wheezing. 1 Inhaler 3  . aspirin EC 81 MG tablet Take 1 tablet (81 mg total) by mouth every other day.    Marland Kitchen atorvastatin (LIPITOR) 20 MG tablet TAKE 1 TABLET (20 MG TOTAL) BY MOUTH NIGHTLY 90 tablet 3  . Calcium Carbonate-Vitamin D (CALCIUM 600/VITAMIN D) 600-400 MG-UNIT chew tablet Chew 1 tablet by mouth daily.    . Cholecalciferol (VITAMIN D3) 1000 units CAPS Take 2 capsules (2,000 Units total) by mouth daily. 60 capsule   . lisinopril (ZESTRIL) 2.5 MG tablet Take 1 tablet (2.5 mg total) by mouth daily. (Patient taking differently: Take 10 mg by mouth  daily. ) 30 tablet 6  . metFORMIN (GLUCOPHAGE) 1000 MG tablet Take 1 tablet (1,000 mg total) by mouth 2 (two) times daily with a meal. 180 tablet 3  . oxybutynin (DITROPAN) 5 MG tablet TAKE 1 TABLET (5 MG TOTAL) BY MOUTH 2 (TWO) TIMES DAILY AS NEEDED (INCONTINENCE). 60 tablet 6  . pantoprazole (PROTONIX) 40 MG tablet Take 1 tablet (40 mg total) by mouth every other day. 45 tablet 3  . sitaGLIPtin (JANUVIA) 50 MG tablet TAKE 1 TABLET (50 MG TOTAL) BY MOUTH DAILY. 90 tablet 3   No facility-administered medications prior to visit.      Per HPI unless specifically indicated in ROS section below Review of Systems Objective:    BP 136/78 (BP Location: Left Arm, Patient Position: Sitting, Cuff Size: Normal)   Pulse 98   Temp 97.8 F (36.6 C) (Oral)   Ht 5\' 2"  (1.575 m)   Wt 130 lb (59 kg)   SpO2 95%   BMI 23.78 kg/m   Wt Readings from Last 3 Encounters:  05/03/18 130 lb (59 kg)  03/30/18 130 lb (59 kg)  03/06/18 134 lb 4 oz (60.9 kg)    Physical Exam Vitals signs and nursing note reviewed.  Constitutional:      General: She is not in acute distress.    Appearance: She is well-developed.  HENT:  Head: Normocephalic and atraumatic.     Right Ear: Ear canal and external ear normal. Decreased hearing noted.     Left Ear: Ear canal and external ear normal. Decreased hearing noted.     Ears:     Comments: Retracted TMs bilaterally, L TM injected with fluid behind TM, R TM with ?fluid vs growth behind TM anteriorly    Nose: Rhinorrhea present. No mucosal edema.     Right Sinus: No maxillary sinus tenderness or frontal sinus tenderness.     Left Sinus: No maxillary sinus tenderness or frontal sinus tenderness.     Comments: Pale nasal mucosa     Mouth/Throat:     Pharynx: Uvula midline. No oropharyngeal exudate or posterior oropharyngeal erythema.     Tonsils: No tonsillar abscesses.  Eyes:     General: No scleral icterus.    Conjunctiva/sclera: Conjunctivae normal.     Pupils:  Pupils are equal, round, and reactive to light.  Neck:     Musculoskeletal: Normal range of motion and neck supple.  Cardiovascular:     Rate and Rhythm: Normal rate and regular rhythm.     Heart sounds: Murmur (2/6 systolic) present.  Pulmonary:     Effort: Pulmonary effort is normal. No respiratory distress.     Breath sounds: Normal breath sounds. No wheezing or rales.     Comments: Lungs overall clear Lymphadenopathy:     Cervical: No cervical adenopathy.  Skin:    General: Skin is warm and dry.     Findings: No rash.       Assessment & Plan:   Problem List Items Addressed This Visit    Bilateral serous otitis media    Start flonase. Update with effect. Known R hearing loss, now with new L sided hearing loss. Consider ENT eval if not improving with treatment.      Relevant Medications   amoxicillin-clavulanate (AUGMENTIN) 875-125 MG tablet   Acute sinusitis - Primary    Will treat for bacterial infection given duration and progression of symptoms. augmentin 7d course. Update with effect. Add flonase       Relevant Medications   amoxicillin-clavulanate (AUGMENTIN) 875-125 MG tablet   fluticasone (FLONASE) 50 MCG/ACT nasal spray       Meds ordered this encounter  Medications  . amoxicillin-clavulanate (AUGMENTIN) 875-125 MG tablet    Sig: Take 1 tablet by mouth 2 (two) times daily for 7 days.    Dispense:  14 tablet    Refill:  0  . fluticasone (FLONASE) 50 MCG/ACT nasal spray    Sig: Place 2 sprays into both nostrils daily.    Dispense:  16 g    Refill:  1   No orders of the defined types were placed in this encounter.   Follow up plan: Return if symptoms worsen or fail to improve.  Ria Bush, MD

## 2018-05-10 ENCOUNTER — Other Ambulatory Visit: Payer: Self-pay | Admitting: Family Medicine

## 2018-05-10 DIAGNOSIS — Z1231 Encounter for screening mammogram for malignant neoplasm of breast: Secondary | ICD-10-CM

## 2018-05-24 ENCOUNTER — Other Ambulatory Visit: Payer: Self-pay | Admitting: Family Medicine

## 2018-05-25 ENCOUNTER — Other Ambulatory Visit: Payer: Self-pay | Admitting: Family Medicine

## 2018-06-08 ENCOUNTER — Other Ambulatory Visit: Payer: Self-pay | Admitting: Family Medicine

## 2018-06-08 ENCOUNTER — Ambulatory Visit (INDEPENDENT_AMBULATORY_CARE_PROVIDER_SITE_OTHER): Payer: Medicare Other

## 2018-06-08 VITALS — BP 118/64 | HR 89 | Temp 97.5°F | Ht 61.5 in | Wt 129.9 lb

## 2018-06-08 DIAGNOSIS — E785 Hyperlipidemia, unspecified: Secondary | ICD-10-CM | POA: Diagnosis not present

## 2018-06-08 DIAGNOSIS — R7989 Other specified abnormal findings of blood chemistry: Secondary | ICD-10-CM

## 2018-06-08 DIAGNOSIS — E118 Type 2 diabetes mellitus with unspecified complications: Secondary | ICD-10-CM | POA: Diagnosis not present

## 2018-06-08 DIAGNOSIS — N183 Chronic kidney disease, stage 3 unspecified: Secondary | ICD-10-CM

## 2018-06-08 DIAGNOSIS — E1122 Type 2 diabetes mellitus with diabetic chronic kidney disease: Secondary | ICD-10-CM | POA: Diagnosis not present

## 2018-06-08 DIAGNOSIS — Z0000 Encounter for general adult medical examination without abnormal findings: Secondary | ICD-10-CM | POA: Diagnosis not present

## 2018-06-08 LAB — MICROALBUMIN / CREATININE URINE RATIO
Creatinine,U: 105.4 mg/dL
Microalb Creat Ratio: 297.7 mg/g — ABNORMAL HIGH (ref 0.0–30.0)
Microalb, Ur: 313.7 mg/dL — ABNORMAL HIGH (ref 0.0–1.9)

## 2018-06-08 LAB — CBC WITH DIFFERENTIAL/PLATELET
Basophils Absolute: 0.1 10*3/uL (ref 0.0–0.1)
Basophils Relative: 0.8 % (ref 0.0–3.0)
Eosinophils Absolute: 0.3 10*3/uL (ref 0.0–0.7)
Eosinophils Relative: 3.7 % (ref 0.0–5.0)
HCT: 38 % (ref 36.0–46.0)
Hemoglobin: 12.5 g/dL (ref 12.0–15.0)
LYMPHS ABS: 1.3 10*3/uL (ref 0.7–4.0)
Lymphocytes Relative: 14.5 % (ref 12.0–46.0)
MCHC: 32.9 g/dL (ref 30.0–36.0)
MCV: 88.7 fl (ref 78.0–100.0)
Monocytes Absolute: 0.7 10*3/uL (ref 0.1–1.0)
Monocytes Relative: 8.5 % (ref 3.0–12.0)
NEUTROS PCT: 72.5 % (ref 43.0–77.0)
Neutro Abs: 6.3 10*3/uL (ref 1.4–7.7)
Platelets: 235 10*3/uL (ref 150.0–400.0)
RBC: 4.28 Mil/uL (ref 3.87–5.11)
RDW: 14.2 % (ref 11.5–15.5)
WBC: 8.7 10*3/uL (ref 4.0–10.5)

## 2018-06-08 LAB — COMPREHENSIVE METABOLIC PANEL
ALT: 39 U/L — ABNORMAL HIGH (ref 0–35)
AST: 45 U/L — ABNORMAL HIGH (ref 0–37)
Albumin: 3.9 g/dL (ref 3.5–5.2)
Alkaline Phosphatase: 83 U/L (ref 39–117)
BUN: 20 mg/dL (ref 6–23)
CO2: 28 mEq/L (ref 19–32)
Calcium: 9.2 mg/dL (ref 8.4–10.5)
Chloride: 102 mEq/L (ref 96–112)
Creatinine, Ser: 1.16 mg/dL (ref 0.40–1.20)
GFR: 45.82 mL/min — ABNORMAL LOW (ref >60.00)
Glucose, Bld: 138 mg/dL — ABNORMAL HIGH (ref 70–99)
Potassium: 4.6 mEq/L (ref 3.5–5.1)
Sodium: 139 mEq/L (ref 135–145)
Total Bilirubin: 0.4 mg/dL (ref 0.2–1.2)
Total Protein: 6.8 g/dL (ref 6.0–8.3)

## 2018-06-08 LAB — LIPID PANEL
Cholesterol: 152 mg/dL (ref 0–200)
HDL: 67.7 mg/dL (ref >39.00)
LDL Cholesterol: 66 mg/dL (ref 0–99)
NonHDL: 84.13
Total CHOL/HDL Ratio: 2
Triglycerides: 91 mg/dL (ref 0.0–149.0)
VLDL: 18.2 mg/dL (ref 0.0–40.0)

## 2018-06-08 LAB — VITAMIN D 25 HYDROXY (VIT D DEFICIENCY, FRACTURES): VITD: 18.63 ng/mL — AB (ref 30.00–100.00)

## 2018-06-08 LAB — HEMOGLOBIN A1C: Hgb A1c MFr Bld: 7.2 % — ABNORMAL HIGH (ref 4.6–6.5)

## 2018-06-08 LAB — TSH: TSH: 5.97 u[IU]/mL — ABNORMAL HIGH (ref 0.35–4.50)

## 2018-06-08 NOTE — Progress Notes (Signed)
PCP notes:  Health maintenance:  A1C - completed  Abnormal screenings:   Hearing - failed  Hearing Screening   125Hz  250Hz  500Hz  1000Hz  2000Hz  3000Hz  4000Hz  6000Hz  8000Hz   Right ear:   40 0 0  0    Left ear:   0 0 0  0     Patient concerns:   None  Nurse concerns:  None  Next PCP appt:   06/15/18 @ 1500  I reviewed health advisor's note, was available for consultation on the day of service listed in this note, and agree with documentation and plan. Elsie Stain, MD.

## 2018-06-08 NOTE — Progress Notes (Signed)
Subjective:   Cheryl Bird is a 73 y.o. female who presents for Medicare Annual (Subsequent) preventive examination.  Review of Systems:  N/A Cardiac Risk Factors include: advanced age (>11men, >83 women);diabetes mellitus;dyslipidemia;hypertension;smoking/ tobacco exposure     Objective:     Vitals: BP 118/64 (BP Location: Right Arm, Patient Position: Sitting, Cuff Size: Normal)   Pulse 89   Temp (!) 97.5 F (36.4 C) (Oral)   Ht 5' 1.5" (1.562 m) Comment: shoes  Wt 129 lb 14.4 oz (58.9 kg)   SpO2 93%   BMI 24.15 kg/m   Body mass index is 24.15 kg/m.  Advanced Directives 06/08/2018 03/30/2018 08/25/2017 05/31/2017 05/30/2016 05/27/2016 12/21/2015  Does Patient Have a Medical Advance Directive? No Yes No No No No No  Would patient like information on creating a medical advance directive? No - Patient declined - No - Patient declined No - Patient declined - - No - patient declined information    Tobacco Social History   Tobacco Use  Smoking Status Current Some Day Smoker  . Packs/day: 1.00  . Years: 56.00  . Pack years: 56.00  . Types: Cigarettes  . Start date: 04/25/1968  Smokeless Tobacco Never Used  Tobacco Comment   smokes 1 pack week currently     Ready to quit: No Counseling given: No Comment: smokes 1 pack week currently   Clinical Intake:  Pre-visit preparation completed: Yes  Pain : No/denies pain Pain Score: 0-No pain     Nutritional Status: BMI of 19-24  Normal Nutritional Risks: None Diabetes: Yes CBG done?: No Did pt. bring in CBG monitor from home?: No  How often do you need to have someone help you when you read instructions, pamphlets, or other written materials from your doctor or pharmacy?: 1 - Never What is the last grade level you completed in school?: GED + 2 yrs college  Interpreter Needed?: No  Comments: pt lives alone Information entered by :: LPinson, LPN  Past Medical History:  Diagnosis Date  . Adenomatous rectal polyp 09/2004     Nicolasa Ducking)  . Background diabetic retinopathy ([place]) 01/2016  . Barrett esophagus 02/2012   on EGD biopsy Gustavo Lah)  . Carotid stenosis 06/04/2016   40-59% RICA stenosis. 2-59% LICA stenosis. Repeat yearly (06/2016)  . COPD, moderate ([place]) 02/12/2013   Spirometry - 01/2013: FEV1 = 59%, post alb 78%, ratio 0.6, mod severe obstruction, good response to bronchodilator   . Diabetes type 2, controlled ([place])    DMSE @ [facility] 11/2011  . Diverticulosis 09/2004   by colonoscopy  . Ex-smoker 01/2013  . GERD (gastroesophageal reflux disease)   . History of cleft palate   . HLD (hyperlipidemia)   . HTN (hypertension)    dx per prior records  . Osteopenia 05/2015   T -1.4 femur, foreamr -0.7  . Right-sided sensorineural hearing loss    from infection as child, hsa been seen by Dr. Pryor Ochoa  . Tubular adenoma of colon    Past Surgical History:  Procedure Laterality Date  . BLADDER SUSPENSION    . CATARACT EXTRACTION  2013   Right  . COLONOSCOPY  10/08/2004   rectal polyp (tubular adenoma), diverticulosis - Dr. Nicolasa Ducking  . COLONOSCOPY  02/2012   multiple polyps - tubular and sessile adenomas, diverticulosis, decreased rectal tone ([place]) rec rpt 3 yrs  . COLONOSCOPY  2017   stool present, to reschedule Gustavo Lah)  . COLONOSCOPY WITH PROPOFOL N/A 12/21/2015   Procedure: COLONOSCOPY WITH PROPOFOL;  Surgeon: Hassell Done  Kassie Mends, MD;  Location: [facility] ENDOSCOPY;  Service: Endoscopy;  Laterality: N/A;  . COLONOSCOPY WITH PROPOFOL N/A 05/30/2016   TAx3, rpt 3 yrs Lollie Sails, MD)  . ESOPHAGOGASTRODUODENOSCOPY  02/2012   irregular z line (+barretts), normal stomach and duodenum, rpt EGD 1 yr  . ESOPHAGOGASTRODUODENOSCOPY  05/2013   irregular z line (+barretts), HH, reflux gastroesophagitis, rpt 2 yrs  . ESOPHAGOGASTRODUODENOSCOPY  11/2015   chronic gastritis, reflux esophagitis, + barrett's, + H pylori, rpt 2 yrs ([place])  . ESOPHAGOGASTRODUODENOSCOPY (EGD) WITH PROPOFOL N/A 12/21/2015   Procedure:  ESOPHAGOGASTRODUODENOSCOPY (EGD) WITH PROPOFOL;  Surgeon: Lollie Sails, MD;  Location: [facility] ENDOSCOPY;  Service: Endoscopy;  Laterality: N/A;  . ESOPHAGOGASTRODUODENOSCOPY (EGD) WITH PROPOFOL N/A 03/30/2018   gastritis, reflux gastroesophagitis, +barrett's, H pylori neg, rpt 3 yrs Gustavo Lah, Billie Ruddy, MD)  . EYE SURGERY    . TONSILLECTOMY AND ADENOIDECTOMY    . TUBAL LIGATION     Family History  Problem Relation Age of Onset  . Diabetes Maternal Aunt   . Alcohol abuse Mother   . Heart disease Mother        enlarged heart  . Kidney failure Mother   . Hypertension Mother   . Coronary artery disease Father 77  . Coronary artery disease Brother 69  . Cancer Sister        kidney   . Stroke Neg Hx   . Breast cancer Neg Hx    Social History   Socioeconomic History  . Marital status: Single    Spouse name: Not on file  . Number of children: Not on file  . Years of education: Not on file  . Highest education level: Not on file  Occupational History  . Not on file  Social Needs  . Financial resource strain: Not on file  . Food insecurity:    Worry: Not on file    Inability: Not on file  . Transportation needs:    Medical: Not on file    Non-medical: Not on file  Tobacco Use  . Smoking status: Current Some Day Smoker    Packs/day: 1.00    Years: 56.00    Pack years: 56.00    Types: Cigarettes    Start date: 04/25/1968  . Smokeless tobacco: Never Used  . Tobacco comment: smokes 1 pack week currently  Substance and Sexual Activity  . Alcohol use: No  . Drug use: No  . Sexual activity: Never  Lifestyle  . Physical activity:    Days per week: Not on file    Minutes per session: Not on file  . Stress: Not on file  Relationships  . Social connections:    Talks on phone: Not on file    Gets together: Not on file    Attends religious service: Not on file    Active member of club or organization: Not on file    Attends meetings of clubs or organizations: Not on file     Relationship status: Not on file  Other Topics Concern  . Not on file  Social History Narrative   Caffeine: 2 cups coffee/day   [place] up in [place]   Occupation: retired   Edu: HS   Activity: walks daily 20min   Diet: good water, fruits/vegetables daily      Advanced directives: pt aware of this. Would want HCPOA to be daughter. Son in Sports coach is Therapist, sports of finances. Does want to stay on life support if reversible, otherwise doesn't want prolonged life  support. Has spoken with daughter/son in law about this. Has living will/advanced directive at home but it's not notarized.    Outpatient Encounter Medications as of 06/08/2018  Medication Sig  . albuterol (PROVENTIL HFA;VENTOLIN HFA) 108 (90 BASE) MCG/ACT inhaler Inhale 2 puffs into the lungs every 6 (six) hours as needed for wheezing.  Marland Kitchen aspirin EC 81 MG tablet Take 1 tablet (81 mg total) by mouth every other day.  Marland Kitchen atorvastatin (LIPITOR) 20 MG tablet TAKE 1 TABLET BY MOUTH EVERY DAY AT NIGHT  . Calcium Carbonate-Vitamin D (CALCIUM 600/VITAMIN D) 600-400 MG-UNIT chew tablet Chew 1 tablet by mouth daily.  . Cholecalciferol (VITAMIN D3) 1000 units CAPS Take 2 capsules (2,000 Units total) by mouth daily.  . fluticasone (FLONASE) 50 MCG/ACT nasal spray SPRAY 2 SPRAYS INTO EACH NOSTRIL EVERY DAY  . lisinopril (PRINIVIL,ZESTRIL) 2.5 MG tablet TAKE 1 TABLET BY MOUTH EVERY DAY  . metFORMIN (GLUCOPHAGE) 1000 MG tablet Take 1 tablet (1,000 mg total) by mouth 2 (two) times daily with a meal.  . oxybutynin (DITROPAN) 5 MG tablet TAKE 1 TABLET (5 MG TOTAL) BY MOUTH 2 (TWO) TIMES DAILY AS NEEDED (INCONTINENCE).  Marland Kitchen pantoprazole (PROTONIX) 40 MG tablet Take 1 tablet (40 mg total) by mouth every other day.  . sitaGLIPtin (JANUVIA) 50 MG tablet TAKE 1 TABLET BY MOUTH EVERY DAY   No facility-administered encounter medications on file as of 06/08/2018.     Activities of Daily Living In your present state of health, do you have any difficulty performing the  following activities: 06/08/2018  Hearing? Y  Vision? N  Difficulty concentrating or making decisions? N  Walking or climbing stairs? N  Dressing or bathing? N  Doing errands, shopping? N  Preparing Food and eating ? N  Using the Toilet? N  In the past six months, have you accidently leaked urine? Y  Do you have problems with loss of bowel control? N  Managing your Medications? N  Managing your Finances? N  Housekeeping or managing your Housekeeping? N  Some recent data might be hidden    Patient Care Team: Ria Bush, MD as PCP - General (Family Medicine) Dingeldein, Remo Lipps, MD as Consulting Physician (Ophthalmology) Lollie Sails, MD as Consulting Physician (Gastroenterology) Ok Edwards, NP as Nurse Practitioner (Gastroenterology)    Assessment:   This is a routine wellness examination for Dylann.   Hearing Screening   125Hz  250Hz  500Hz  1000Hz  2000Hz  3000Hz  4000Hz  6000Hz  8000Hz   Right ear:   40 0 0  0    Left ear:   0 0 0  0    Vision Screening Comments: Vision exam in Nov 2019 with Dr. Sandra Cockayne   Exercise Activities and Dietary recommendations Current Exercise Habits: The patient does not participate in regular exercise at present, Exercise limited by: None identified  Goals    . Patient Stated     Started 06/08/18, I will continue to take medications as prescribed.        Fall Risk Fall Risk  06/08/2018 05/31/2017 05/27/2016 06/02/2015 05/28/2014  Falls in the past year? 0 Yes No No No  Comment - 2 falls during inclement weather; 1 fall due to hole in yard; denies injury with any fall - - -  Number falls in past yr: - 2 or more - - -  Injury with Fall? - No - - -  Risk for fall due to : - - - - -   Depression Screen [facility] 2/9 Scores 06/08/2018 05/31/2017 05/27/2016  06/02/2015  PHQ - 2 Score 0 0 0 0  PHQ- 9 Score 0 0 - -     Cognitive Function MMSE - Mini Mental State Exam 06/08/2018 05/31/2017 05/27/2016  Orientation to time 5 5 5   Orientation to  Place 5 5 5   Registration 3 3 3   Attention/ Calculation 0 0 0  Recall 3 3 3   Language- name 2 objects 0 0 0  Language- repeat 1 1 1   Language- follow 3 step command 3 3 3   Language- read & follow direction 0 0 0  Write a sentence 0 0 0  Copy design 0 0 0  Total score 20 20 20      PLEASE NOTE: A Mini-Cog screen was completed. Maximum score is 20. A value of 0 denotes this part of [place] MMSE was not completed or the patient failed this part of the Mini-Cog screening.   Mini-Cog Screening Orientation to Time - Max 5 pts Orientation to Place - Max 5 pts Registration - Max 3 pts Recall - Max 3 pts Language Repeat - Max 1 pts Language Follow 3 Step Command - Max 3 pts     Immunization History  Administered Date(s) Administered  . Influenza Split 03/26/2012  . Influenza,inj,Quad PF,6+ Mos 01/04/2013, 01/09/2014, 06/02/2015, 05/27/2016, 05/31/2017, 03/06/2018  . Pneumococcal Conjugate-13 01/09/2014  . Pneumococcal Polysaccharide-23 12/23/2011  . Td 12/23/2011    Screening Tests Health Maintenance  Topic Date Due  . DTaP/Tdap/Td (1 - Tdap) 12/22/2021 (Originally 09/03/1956)  . MAMMOGRAM  06/16/2018  . HEMOGLOBIN A1C  12/07/2018  . FOOT EXAM  03/07/2019  . OPHTHALMOLOGY EXAM  04/20/2019  . COLONOSCOPY  05/31/2019  . TETANUS/TDAP  12/22/2021  . INFLUENZA VACCINE  Completed  . DEXA SCAN  Completed  . Hepatitis C Screening  Completed  . PNA vac Low Risk Adult  Completed      Plan:     I have personally reviewed, addressed, and noted the following in the patient's chart:  A. Medical and social history B. Use of alcohol, tobacco or illicit drugs  C. Current medications and supplements D. Functional ability and status E.  Nutritional status F.  Physical activity G. Advance directives H. List of other physicians I.  Hospitalizations, surgeries, and ER visits in previous 12 months J.  [place] to include hearing, vision, cognitive, depression L. Referrals  and appointments - none  In addition, I have reviewed and discussed with patient certain preventive protocols, quality metrics, and best practice recommendations. A written personalized care plan for preventive services as well as general preventive health recommendations were provided to patient.  See attached scanned questionnaire for additional information.   Signed,   Lindell Noe, MHA, BS, LPN Health Coach

## 2018-06-08 NOTE — Patient Instructions (Addendum)
Cheryl Bird , Thank you for taking time to come for your Medicare Wellness Visit. I appreciate your ongoing commitment to your health goals. Please review the following plan we discussed and let me know if I can assist you in the future.   These are the goals we discussed: Goals    . Patient Stated     Started 06/08/18, I will continue to take medications as prescribed.        This is a list of the screening recommended for you and due dates:  Health Maintenance  Topic Date Due  . DTaP/Tdap/Td vaccine (1 - Tdap) 12/22/2021*  . Mammogram  06/16/2018  . Hemoglobin A1C  12/07/2018  . Complete foot exam   03/07/2019  . Eye exam for diabetics  04/20/2019  . Colon Cancer Screening  05/31/2019  . Tetanus Vaccine  12/22/2021  . Flu Shot  Completed  . DEXA scan (bone density measurement)  Completed  .  Hepatitis C: One time screening is recommended by Center for Disease Control  (CDC) for  adults born from [birth year] through 1965.   Completed  . Pneumonia vaccines  Completed  *Topic was postponed. The date shown is not the original due date.   Preventive Care for Adults  A healthy lifestyle and preventive care can promote health and wellness. Preventive health guidelines for adults include the following key practices.  . A routine yearly physical is a good way to check with your health care provider about your health and preventive screening. It is a chance to share any concerns and updates on your health and to receive a thorough exam.  . Visit your dentist for a routine exam and preventive care every 6 months. Brush your teeth twice a day and floss once a day. Good oral hygiene prevents tooth decay and gum disease.  . The frequency of eye exams is based on your age, health, family medical history, use  of contact lenses, and other factors. Follow your health care provider's recommendations for frequency of eye exams.  . Eat a healthy diet. Foods like vegetables, fruits, whole grains, low-fat  dairy products, and lean protein foods contain the nutrients you need without too many calories. Decrease your intake of foods high in solid fats, added sugars, and salt. Eat the right amount of calories for you. Get information about a proper diet from your health care provider, if necessary.  . Regular physical exercise is one of the most important things you can do for your health. Most adults should get at least 150 minutes of moderate-intensity exercise (any activity that increases your heart rate and causes you to sweat) each week. In addition, most adults need muscle-strengthening exercises on 2 or more days a week.  Silver Sneakers may be a benefit available to you. To determine eligibility, you may visit the website: www.silversneakers.com or contact program at 973-270-4012 Mon-Fri between 8AM-8PM.   . Maintain a healthy weight. The body mass index (BMI) is a screening tool to identify possible weight problems. It provides an estimate of body fat based on height and weight. Your health care provider can find your BMI and can help you achieve or maintain a healthy weight.   For adults 20 years and older: ? A BMI below 18.5 is considered underweight. ? A BMI of 18.5 to 24.9 is normal. ? A BMI of 25 to 29.9 is considered overweight. ? A BMI of 30 and above is considered obese.   . Maintain normal blood lipids and  cholesterol levels by exercising and minimizing your intake of saturated fat. Eat a balanced diet with plenty of fruit and vegetables. Blood tests for lipids and cholesterol should begin at age 62 and be repeated every 5 years. If your lipid or cholesterol levels are high, you are over 50, or you are at high risk for heart disease, you may need your cholesterol levels checked more frequently. Ongoing high lipid and cholesterol levels should be treated with medicines if diet and exercise are not working.  . If you smoke, find out from your health care provider how to quit. If you do  not use tobacco, please do not start.  . If you choose to drink alcohol, please do not consume more than 2 drinks per day. One drink is considered to be 12 ounces (355 mL) of beer, 5 ounces (148 mL) of wine, or 1.5 ounces (44 mL) of liquor.  . If you are 60-36 years old, ask your health care provider if you should take aspirin to prevent strokes.  . Use sunscreen. Apply sunscreen liberally and repeatedly throughout the day. You should seek shade when your shadow is shorter than you. Protect yourself by wearing long sleeves, pants, a wide-brimmed hat, and sunglasses year round, whenever you are outdoors.  . Once a month, do a whole body skin exam, using a mirror to look at the skin on your back. Tell your health care provider of new moles, moles that have irregular borders, moles that are larger than a pencil eraser, or moles that have changed in shape or color.

## 2018-06-15 ENCOUNTER — Encounter: Payer: Medicare Other | Admitting: Family Medicine

## 2018-06-18 ENCOUNTER — Other Ambulatory Visit: Payer: Self-pay | Admitting: Family Medicine

## 2018-06-18 ENCOUNTER — Ambulatory Visit
Admission: RE | Admit: 2018-06-18 | Discharge: 2018-06-18 | Disposition: A | Discharge status: Home / Self Care | Payer: Medicare Other | Source: Ambulatory Visit | Attending: Family Medicine | Admitting: Family Medicine

## 2018-06-18 DIAGNOSIS — Z1231 Encounter for screening mammogram for malignant neoplasm of breast: Secondary | ICD-10-CM | POA: Diagnosis not present

## 2018-06-18 LAB — HM MAMMOGRAPHY

## 2018-06-19 ENCOUNTER — Encounter: Payer: Self-pay | Admitting: Family Medicine

## 2018-07-25 ENCOUNTER — Encounter: Payer: Medicare Other | Admitting: Family Medicine

## 2018-07-25 ENCOUNTER — Other Ambulatory Visit: Payer: Self-pay | Admitting: Family Medicine

## 2018-08-17 ENCOUNTER — Other Ambulatory Visit: Payer: Self-pay | Admitting: Family Medicine

## 2018-08-20 ENCOUNTER — Other Ambulatory Visit: Payer: Self-pay | Admitting: Family Medicine

## 2018-09-20 ENCOUNTER — Other Ambulatory Visit: Payer: Self-pay | Admitting: Family Medicine

## 2018-10-01 ENCOUNTER — Other Ambulatory Visit: Payer: Self-pay | Admitting: Family Medicine

## 2018-10-03 ENCOUNTER — Telehealth: Payer: Self-pay | Admitting: Family Medicine

## 2018-10-03 NOTE — Telephone Encounter (Signed)
Pt called to check status on authorization for her medication. Please advise

## 2018-10-03 NOTE — Telephone Encounter (Signed)
Patient called to get a refill on her Amlodipine.  Patient uses [facility]. Pharmacy told patient they were unable to get a refill. Patient took her last pill yesterday morning.

## 2018-10-03 NOTE — Telephone Encounter (Signed)
Disregard previous message, pt has questions/ requesting call back   Best Contact: [phone] VM [phone]

## 2018-10-05 MED ORDER — AMLODIPINE BESYLATE 5 MG PO TABS: 5.0000 mg | ORAL_TABLET | Freq: Every day | ORAL | 0 refills | Status: DC

## 2018-10-05 NOTE — Telephone Encounter (Signed)
Spoke with pt asking about the amlodipine informing her it is not on her current med list.  Pt says she still takes it and has not been told to stop it. She requests refill.   I updated med list, e-scribed refill and notified pt refill was sent to the pharmacy.

## 2018-10-16 ENCOUNTER — Encounter: Payer: Medicare Other | Admitting: Family Medicine

## 2018-10-17 ENCOUNTER — Other Ambulatory Visit: Payer: Self-pay | Admitting: Family Medicine

## 2018-11-13 ENCOUNTER — Other Ambulatory Visit: Payer: Self-pay | Admitting: Family Medicine

## 2018-11-16 ENCOUNTER — Other Ambulatory Visit: Payer: Self-pay | Admitting: Family Medicine

## 2018-11-29 ENCOUNTER — Encounter: Payer: Self-pay | Admitting: Family Medicine

## 2018-11-29 ENCOUNTER — Other Ambulatory Visit: Payer: Self-pay

## 2018-11-29 ENCOUNTER — Ambulatory Visit (INDEPENDENT_AMBULATORY_CARE_PROVIDER_SITE_OTHER): Payer: Medicare Other | Admitting: Family Medicine

## 2018-11-29 VITALS — BP 122/64 | HR 90 | Temp 97.6°F | Ht 61.0 in | Wt 130.1 lb

## 2018-11-29 DIAGNOSIS — E1122 Type 2 diabetes mellitus with diabetic chronic kidney disease: Secondary | ICD-10-CM | POA: Diagnosis not present

## 2018-11-29 DIAGNOSIS — R7989 Other specified abnormal findings of blood chemistry: Secondary | ICD-10-CM | POA: Diagnosis not present

## 2018-11-29 DIAGNOSIS — N3946 Mixed incontinence: Secondary | ICD-10-CM

## 2018-11-29 DIAGNOSIS — I700 Atherosclerosis of aorta: Secondary | ICD-10-CM

## 2018-11-29 DIAGNOSIS — K2270 Barrett's esophagus without dysplasia: Secondary | ICD-10-CM

## 2018-11-29 DIAGNOSIS — R7401 Elevation of levels of liver transaminase levels: Secondary | ICD-10-CM

## 2018-11-29 DIAGNOSIS — R300 Dysuria: Secondary | ICD-10-CM

## 2018-11-29 DIAGNOSIS — Z0001 Encounter for general adult medical examination with abnormal findings: Secondary | ICD-10-CM | POA: Diagnosis not present

## 2018-11-29 DIAGNOSIS — E118 Type 2 diabetes mellitus with unspecified complications: Secondary | ICD-10-CM | POA: Diagnosis not present

## 2018-11-29 DIAGNOSIS — E559 Vitamin D deficiency, unspecified: Secondary | ICD-10-CM | POA: Diagnosis not present

## 2018-11-29 DIAGNOSIS — R740 Nonspecific elevation of levels of transaminase and lactic acid dehydrogenase [LDH]: Secondary | ICD-10-CM

## 2018-11-29 DIAGNOSIS — I10 Essential (primary) hypertension: Secondary | ICD-10-CM

## 2018-11-29 DIAGNOSIS — E785 Hyperlipidemia, unspecified: Secondary | ICD-10-CM

## 2018-11-29 DIAGNOSIS — R011 Cardiac murmur, unspecified: Secondary | ICD-10-CM

## 2018-11-29 DIAGNOSIS — J449 Chronic obstructive pulmonary disease, unspecified: Secondary | ICD-10-CM

## 2018-11-29 DIAGNOSIS — F17200 Nicotine dependence, unspecified, uncomplicated: Secondary | ICD-10-CM

## 2018-11-29 DIAGNOSIS — I6523 Occlusion and stenosis of bilateral carotid arteries: Secondary | ICD-10-CM

## 2018-11-29 DIAGNOSIS — G459 Transient cerebral ischemic attack, unspecified: Secondary | ICD-10-CM

## 2018-11-29 DIAGNOSIS — E113299 Type 2 diabetes mellitus with mild nonproliferative diabetic retinopathy without macular edema, unspecified eye: Secondary | ICD-10-CM

## 2018-11-29 DIAGNOSIS — N183 Chronic kidney disease, stage 3 (moderate): Secondary | ICD-10-CM

## 2018-11-29 DIAGNOSIS — Z0000 Encounter for general adult medical examination without abnormal findings: Secondary | ICD-10-CM

## 2018-11-29 LAB — POCT GLYCOSYLATED HEMOGLOBIN (HGB A1C): Hemoglobin A1C: 7.4 % — AB (ref 4.0–5.6)

## 2018-11-29 LAB — COMPREHENSIVE METABOLIC PANEL
ALT: 34 U/L (ref 0–35)
AST: 37 U/L (ref 0–37)
Albumin: 3.7 g/dL (ref 3.5–5.2)
Alkaline Phosphatase: 92 U/L (ref 39–117)
BUN: 23 mg/dL (ref 6–23)
CO2: 26 mEq/L (ref 19–32)
Calcium: 9.3 mg/dL (ref 8.4–10.5)
Chloride: 108 mEq/L (ref 96–112)
Creatinine, Ser: 1.21 mg/dL — ABNORMAL HIGH (ref 0.40–1.20)
GFR: 43.59 mL/min — ABNORMAL LOW (ref >60.00)
Glucose, Bld: 138 mg/dL — ABNORMAL HIGH (ref 70–99)
Potassium: 4.6 mEq/L (ref 3.5–5.1)
Sodium: 141 mEq/L (ref 135–145)
Total Bilirubin: 0.4 mg/dL (ref 0.2–1.2)
Total Protein: 6.9 g/dL (ref 6.0–8.3)

## 2018-11-29 LAB — POC URINALSYSI DIPSTICK (AUTOMATED)
Glucose, UA: NEGATIVE
Ketones, UA: NEGATIVE
Nitrite, UA: NEGATIVE
Protein, UA: POSITIVE — AB
Spec Grav, UA: 1.025 (ref 1.010–1.025)
Urobilinogen, UA: 0.2 E.U./dL
pH, UA: 6 (ref 5.0–8.0)

## 2018-11-29 LAB — TSH: TSH: 1.46 u[IU]/mL (ref 0.35–4.50)

## 2018-11-29 LAB — VITAMIN D 25 HYDROXY (VIT D DEFICIENCY, FRACTURES): VITD: 13.23 ng/mL — ABNORMAL LOW (ref 30.00–100.00)

## 2018-11-29 LAB — T4, FREE: Free T4: 0.83 ng/dL (ref 0.60–1.60)

## 2018-11-29 MED ORDER — PANTOPRAZOLE SODIUM 40 MG PO TBEC: 40.0000 mg | DELAYED_RELEASE_TABLET | ORAL | 3 refills | Status: DC

## 2018-11-29 MED ORDER — SITAGLIPTIN PHOSPHATE 50 MG PO TABS: 50.0000 mg | ORAL_TABLET | Freq: Every day | ORAL | 3 refills | Status: DC

## 2018-11-29 MED ORDER — OXYBUTYNIN CHLORIDE 5 MG PO TABS: 5.0000 mg | ORAL_TABLET | Freq: Two times a day (BID) | ORAL | 8 refills | Status: DC | PRN

## 2018-11-29 MED ORDER — METFORMIN HCL 1000 MG PO TABS: 1000.0000 mg | ORAL_TABLET | Freq: Two times a day (BID) | ORAL | 3 refills | Status: DC

## 2018-11-29 MED ORDER — LISINOPRIL 2.5 MG PO TABS: 2.5000 mg | ORAL_TABLET | Freq: Every day | ORAL | 3 refills | Status: DC

## 2018-11-29 MED ORDER — AMLODIPINE BESYLATE 5 MG PO TABS: 5.0000 mg | ORAL_TABLET | Freq: Every day | ORAL | 3 refills | Status: DC

## 2018-11-29 MED ORDER — CEPHALEXIN 500 MG PO CAPS: 500.0000 mg | ORAL_CAPSULE | Freq: Two times a day (BID) | ORAL | 0 refills | Status: DC

## 2018-11-29 MED ORDER — ATORVASTATIN CALCIUM 20 MG PO TABS: 20.0000 mg | ORAL_TABLET | Freq: Every day | ORAL | 3 refills | Status: DC

## 2018-11-29 MED ORDER — ALBUTEROL SULFATE HFA 108 (90 BASE) MCG/ACT IN AERS: 2.0000 | INHALATION_SPRAY | Freq: Four times a day (QID) | RESPIRATORY_TRACT | 6 refills | Status: DC | PRN

## 2018-11-29 NOTE — Assessment & Plan Note (Signed)
On aspirin (QOD) and statin

## 2018-11-29 NOTE — Assessment & Plan Note (Addendum)
With newly endorsed dysuria will check UA/micro and culture if appropriate. Continues oxybutynin PRN.

## 2018-11-29 NOTE — Assessment & Plan Note (Signed)
Preventative protocols reviewed and updated unless pt declined. Discussed healthy diet and lifestyle.  

## 2018-11-29 NOTE — Progress Notes (Addendum)
This visit was conducted in person.  BP 122/64 (BP Location: Left Arm, Patient Position: Sitting, Cuff Size: Normal)   Pulse 90   Temp 97.6 F (36.4 C) (Temporal)   Ht 5\' 1"  (1.549 m)   Wt 130 lb 2 oz (59 kg)   SpO2 95%   BMI 24.59 kg/m    CC: CPE Subjective:    Patient ID: Cheryl Bird, female    DOB: 02/25/1946, 73 y.o.   MRN: [id number]  HPI: Cheryl Bird is a 73 y.o. female presenting on 11/29/2018 for Annual Exam (Pt 2.  )   Saw health advisor 05/2018 for medicare wellness visit. Note reviewed. CPE was postponed until today due to pandemic.   Ongoing trouble with bilateral hand trigger fingers.   Noticing incomplete emptying, dysuria ongoing for "a while" now in addition to chronic incontinence trouble. Takes oxybutynin with some benefit.   Preventative: COLONOSCOPY WITH PROPOFOL2/08/2016;TAx3, rpt 3 yrs Lollie Sails, MD) ESOPHAGOGASTRODUODENOSCOPY (EGD) WITH PROPOFOL 03/30/2018 - gastritis, reflux gastroesophagitis, +barrett's, H pylori neg, rpt 3 yrs Gustavo Lah, Billie Ruddy, MD) Mammo -05/2018 Birads1  Lung cancer screening - undergoing screening started 2018 Last pap smear - 11/2011 WNL decided to aged out. Last pelvic exam2018. DEXA2/2017 osteopenia -T -1.4 femur, forearm -0.7. Walks daily. Calcium in diet. Flu shotyearly Td 11/2011 Pneumovax 8/32013, prevnar 12/2013 shingrix - declined  Advanced directives: full code. Would want HCPOA to be daughter. Son in Sports coach is Therapist, sports of finances. Wants trial of life supportif deemed reversible. Otherwise doesn't want prolonged life support. Has spoken with daughter/son in law about this. Has advanced directive packet at home.  Seat belt use discussed.  Sunscreen use discussed. No changing moles on skin. Current smoker down to 2-3 cig/wk! Alcohol - none- mother was alcoholic Dentist yearly Eye exam yearly Bowel - no constipation Bladder - ongoing incontinence of oxybutynin PRN some incomplete emptying,  occasional dysuria.   Caffeine: 2 cups coffee/day  Grew up in [place] Occupation: retired  Edu: HS  Activity: walks daily 50min  Diet: good water, fruits/vegetables daily     Relevant past medical, surgical, family and social history reviewed and updated as indicated. Interim medical history since our last visit reviewed. Allergies and medications reviewed and updated. Outpatient Medications Prior to Visit  Medication Sig Dispense Refill  . aspirin EC 81 MG tablet Take 1 tablet (81 mg total) by mouth every other day.    . Calcium Carbonate-Vitamin D (CALCIUM 600/VITAMIN D) 600-400 MG-UNIT chew tablet Chew 1 tablet by mouth daily.    . Cholecalciferol (VITAMIN D3) 1000 units CAPS Take 2 capsules (2,000 Units total) by mouth daily. 60 capsule   . fluticasone (FLONASE) 50 MCG/ACT nasal spray SPRAY 2 SPRAYS INTO EACH NOSTRIL EVERY DAY 16 g 5  . albuterol (PROVENTIL HFA;VENTOLIN HFA) 108 (90 BASE) MCG/ACT inhaler Inhale 2 puffs into the lungs every 6 (six) hours as needed for wheezing. 1 Inhaler 3  . amLODipine (NORVASC) 5 MG tablet Take 1 tablet (5 mg total) by mouth daily. 90 tablet 0  . atorvastatin (LIPITOR) 20 MG tablet TAKE 1 TABLET BY MOUTH EVERY DAY EVERY NIGHT 90 tablet 0  . lisinopril (ZESTRIL) 2.5 MG tablet TAKE 1 TABLET BY MOUTH EVERY DAY 90 tablet 0  . metFORMIN (GLUCOPHAGE) 1000 MG tablet TAKE 1 TABLET BY MOUTH 2 TIMES DAILY WITH A MEAL. 180 tablet 0  . oxybutynin (DITROPAN) 5 MG tablet TAKE 1 TABLET (5 MG TOTAL) BY MOUTH 2 (TWO) TIMES DAILY AS NEEDED (  INCONTINENCE). 60 tablet 6  . pantoprazole (PROTONIX) 40 MG tablet TAKE 1 TABLET (40 MG TOTAL) BY MOUTH EVERY OTHER DAY. 45 tablet 2  . sitaGLIPtin (JANUVIA) 50 MG tablet TAKE 1 TABLET BY MOUTH EVERY DAY 90 tablet 0   No facility-administered medications prior to visit.      Per HPI unless specifically indicated in ROS section below Review of Systems  Constitutional: Negative for activity change, appetite change, chills,  fatigue, fever and unexpected weight change.  HENT: Negative for hearing loss.   Eyes: Negative for visual disturbance.  Respiratory: Positive for cough. Negative for chest tightness, shortness of breath and wheezing.   Cardiovascular: Negative for chest pain, palpitations and leg swelling.  Gastrointestinal: Negative for abdominal distention, abdominal pain, blood in stool, constipation, diarrhea, nausea and vomiting.  Genitourinary: Negative for difficulty urinating and hematuria.  Musculoskeletal: Negative for arthralgias, myalgias and neck pain.  Skin: Negative for rash.  Neurological: Negative for dizziness, seizures, syncope and headaches.  Hematological: Negative for adenopathy. Bruises/bleeds easily.  Psychiatric/Behavioral: Negative for dysphoric mood. The patient is not nervous/anxious.    Objective:    BP 122/64 (BP Location: Left Arm, Patient Position: Sitting, Cuff Size: Normal)   Pulse 90   Temp 97.6 F (36.4 C) (Temporal)   Ht 5\' 1"  (1.549 m)   Wt 130 lb 2 oz (59 kg)   SpO2 95%   BMI 24.59 kg/m   Wt Readings from Last 3 Encounters:  11/29/18 130 lb 2 oz (59 kg)  06/08/18 129 lb 14.4 oz (58.9 kg)  05/03/18 130 lb (59 kg)    Physical Exam Vitals signs and nursing note reviewed.  Constitutional:      General: She is not in acute distress.    Appearance: Normal appearance. She is well-developed. She is not ill-appearing.  HENT:     Head: Normocephalic and atraumatic.     Right Ear: Hearing, tympanic membrane, ear canal and external ear normal.     Left Ear: Hearing, tympanic membrane, ear canal and external ear normal.     Nose: Nose normal.     Mouth/Throat:     Mouth: Mucous membranes are moist.     Pharynx: Uvula midline. No oropharyngeal exudate or posterior oropharyngeal erythema.  Eyes:     General: No scleral icterus.    Extraocular Movements: Extraocular movements intact.     Conjunctiva/sclera: Conjunctivae normal.     Pupils: Pupils are equal, round,  and reactive to light.  Neck:     Musculoskeletal: Normal range of motion and neck supple.     Vascular: Carotid bruit (bilateral) present.  Cardiovascular:     Rate and Rhythm: Normal rate and regular rhythm.     Pulses: Normal pulses.          Radial pulses are 2+ on the right side and 2+ on the left side.     Heart sounds: Murmur (3/6 systolic USB) present.  Pulmonary:     Effort: Pulmonary effort is normal. No respiratory distress.     Breath sounds: Normal breath sounds. No wheezing, rhonchi or rales.  Abdominal:     General: Abdomen is flat. Bowel sounds are normal. There is no distension.     Palpations: Abdomen is soft. There is no mass.     Tenderness: There is no abdominal tenderness. There is no guarding or rebound.     Hernia: No hernia is present.  Musculoskeletal: Normal range of motion.     Right lower leg: No edema.  Left lower leg: No edema.  Lymphadenopathy:     Cervical: No cervical adenopathy.  Skin:    General: Skin is warm and dry.     Findings: No rash.  Neurological:     General: No focal deficit present.     Mental Status: She is alert and oriented to person, place, and time.     Comments: CN grossly intact, station and gait intact  Psychiatric:        Mood and Affect: Mood normal.        Behavior: Behavior normal.        Thought Content: Thought content normal.        Judgment: Judgment normal.       Results for orders placed or performed in visit on 11/29/18  POCT glycosylated hemoglobin (Hb A1C)  Result Value Ref Range   Hemoglobin A1C 7.4 (A) 4.0 - 5.6 %   HbA1c POC (<> result, manual entry)     HbA1c, POC (prediabetic range)     HbA1c, POC (controlled diabetic range)    POCT Urinalysis Dipstick (Automated)  Result Value Ref Range   Color, UA yellow    Clarity, UA clear    Glucose, UA Negative Negative   Bilirubin, UA 1+    Ketones, UA negative    Spec Grav, UA 1.025 1.010 - 1.025   Blood, UA +/-    pH, UA 6.0 5.0 - 8.0   Protein,  UA Positive (A) Negative   Urobilinogen, UA 0.2 0.2 or 1.0 E.U./dL   Nitrite, UA negative    Leukocytes, UA Moderate (2+) (A) Negative   Assessment & Plan:   Problem List Items Addressed This Visit    Urinary incontinence, mixed    With newly endorsed dysuria will check UA/micro and culture if appropriate. Continues oxybutynin PRN.       Relevant Medications   oxybutynin (DITROPAN) 5 MG tablet   Other Relevant Orders   POCT Urinalysis Dipstick (Automated) (Completed)   Transaminitis    Update LFTs      Relevant Orders   Comprehensive metabolic panel   TIA (transient ischemic attack)    On aspirin (QOD) and statin      Relevant Medications   amLODipine (NORVASC) 5 MG tablet   atorvastatin (LIPITOR) 20 MG tablet   lisinopril (ZESTRIL) 2.5 MG tablet   Thoracic aortic atherosclerosis (HCC)   Relevant Medications   amLODipine (NORVASC) 5 MG tablet   atorvastatin (LIPITOR) 20 MG tablet   lisinopril (ZESTRIL) 2.5 MG tablet   Smoker    Continues cutting down - to a few cigs/day.       HTN (hypertension)    Chronic, stable. Continue current regimen.       Relevant Medications   amLODipine (NORVASC) 5 MG tablet   atorvastatin (LIPITOR) 20 MG tablet   lisinopril (ZESTRIL) 2.5 MG tablet   HLD (hyperlipidemia)    Chronic, stable on statin. The 10-year ASCVD risk score Mikey Bussing DC Brooke Bonito., et al., 2013) is: 38%   Values used to calculate the score:     Age: 24 years     Sex: Female     Is Non-Hispanic African American: No     Diabetic: Yes     Tobacco smoker: Yes     Systolic Blood Pressure: 675 mmHg     Is BP treated: Yes     HDL Cholesterol: 67.7 mg/dL     Total Cholesterol: 152 mg/dL       Relevant Medications  amLODipine (NORVASC) 5 MG tablet   atorvastatin (LIPITOR) 20 MG tablet   lisinopril (ZESTRIL) 2.5 MG tablet   Heart murmur, systolic   Health maintenance examination - Primary    Preventative protocols reviewed and updated unless pt declined. Discussed  healthy diet and lifestyle.       COPD, moderate (HCC)    Albuterol refilled. Continues cutting down on smoking.       Relevant Medications   albuterol (VENTOLIN HFA) 108 (90 Base) MCG/ACT inhaler   Controlled diabetes mellitus type 2 with complications (HCC)    Chronic, adequate control for age. Continue current regimen.       Relevant Medications   atorvastatin (LIPITOR) 20 MG tablet   lisinopril (ZESTRIL) 2.5 MG tablet   metFORMIN (GLUCOPHAGE) 1000 MG tablet   sitaGLIPtin (JANUVIA) 50 MG tablet   Other Relevant Orders   POCT glycosylated hemoglobin (Hb A1C) (Completed)   CKD stage 3 due to type 2 diabetes mellitus ([place])    Last year we added low dose ACEI. Update Cr.       Relevant Medications   atorvastatin (LIPITOR) 20 MG tablet   lisinopril (ZESTRIL) 2.5 MG tablet   metFORMIN (GLUCOPHAGE) 1000 MG tablet   sitaGLIPtin (JANUVIA) 50 MG tablet   Other Relevant Orders   Comprehensive metabolic panel   Carotid stenosis    Update carotid US.       Relevant Medications   amLODipine (NORVASC) 5 MG tablet   atorvastatin (LIPITOR) 20 MG tablet   lisinopril (ZESTRIL) 2.5 MG tablet   Other Relevant Orders   VAS US CAROTID   Barrett esophagus    UTD on regular monitoring through GI      Background diabetic retinopathy (HCC)   Relevant Medications   atorvastatin (LIPITOR) 20 MG tablet   lisinopril (ZESTRIL) 2.5 MG tablet   metFORMIN (GLUCOPHAGE) 1000 MG tablet   sitaGLIPtin (JANUVIA) 50 MG tablet   Abnormal TSH    Update TSH, FT4      Relevant Orders   TSH   T4, free    Other Visit Diagnoses    Vitamin D deficiency       Relevant Orders   VITAMIN D 25 Hydroxy (Vit-D Deficiency, Fractures)   Dysuria       Relevant Orders   Urine Culture       Meds ordered this encounter  Medications  . albuterol (VENTOLIN HFA) 108 (90 Base) MCG/ACT inhaler    Sig: Inhale 2 puffs into the lungs every 6 (six) hours as needed for wheezing.    Dispense:  18 g    Refill:  6   . amLODipine (NORVASC) 5 MG tablet    Sig: Take 1 tablet (5 mg total) by mouth daily.    Dispense:  90 tablet    Refill:  3  . atorvastatin (LIPITOR) 20 MG tablet    Sig: Take 1 tablet (20 mg total) by mouth daily at 6 PM.    Dispense:  90 tablet    Refill:  3  . lisinopril (ZESTRIL) 2.5 MG tablet    Sig: Take 1 tablet (2.5 mg total) by mouth daily.    Dispense:  90 tablet    Refill:  3  . metFORMIN (GLUCOPHAGE) 1000 MG tablet    Sig: Take 1 tablet (1,000 mg total) by mouth 2 (two) times daily with a meal.    Dispense:  180 tablet    Refill:  3  . oxybutynin (DITROPAN) 5 MG tablet  Sig: Take 1 tablet (5 mg total) by mouth 2 (two) times daily as needed (incontinence).    Dispense:  60 tablet    Refill:  8  . pantoprazole (PROTONIX) 40 MG tablet    Sig: Take 1 tablet (40 mg total) by mouth every other day.    Dispense:  45 tablet    Refill:  3  . sitaGLIPtin (JANUVIA) 50 MG tablet    Sig: Take 1 tablet (50 mg total) by mouth daily.    Dispense:  90 tablet    Refill:  3  . cephALEXin (KEFLEX) 500 MG capsule    Sig: Take 1 capsule (500 mg total) by mouth 2 (two) times daily.    Dispense:  14 capsule    Refill:  0   Orders Placed This Encounter  Procedures  . Urine Culture  . VITAMIN D 25 Hydroxy (Vit-D Deficiency, Fractures)  . Comprehensive metabolic panel  . TSH  . T4, free  . POCT glycosylated hemoglobin (Hb A1C)  . POCT Urinalysis Dipstick (Automated)    Follow up plan: Return in about 6 months (around 06/01/2019) for follow up visit.  Ria Bush, MD

## 2018-11-29 NOTE — Assessment & Plan Note (Addendum)
UTD on regular monitoring through GI

## 2018-11-29 NOTE — Assessment & Plan Note (Signed)
Continues cutting down - to a few cigs/day.

## 2018-11-29 NOTE — Assessment & Plan Note (Signed)
Chronic, stable. Continue current regimen. 

## 2018-11-29 NOTE — Assessment & Plan Note (Signed)
Last year we added low dose ACEI. Update Cr.

## 2018-11-29 NOTE — Assessment & Plan Note (Signed)
Chronic, adequate control for age. Continue current regimen.

## 2018-11-29 NOTE — Assessment & Plan Note (Signed)
Update carotid US.  

## 2018-11-29 NOTE — Patient Instructions (Addendum)
If interested, check with pharmacy about new 2 shot shingles series (shingrix).  Urinalysis today.  Recheck labs today Schedule follow up with Dr Lorelei Pont for trigger fingers We will order carotid ultrasound.  Return as needed or in 6 months for follow up visit.   Health Maintenance After Age 73 After age 35, you are at a higher risk for certain long-term diseases and infections as well as injuries from falls. Falls are a major cause of broken bones and head injuries in people who are older than age 31. Getting regular preventive care can help to keep you healthy and well. Preventive care includes getting regular testing and making lifestyle changes as recommended by your health care provider. Talk with your health care provider about:  Which screenings and tests you should have. A screening is a test that checks for a disease when you have no symptoms.  A diet and exercise plan that is right for you. What should I know about screenings and tests to prevent falls? Screening and testing are the best ways to find a health problem early. Early diagnosis and treatment give you the best chance of managing medical conditions that are common after age 69. Certain conditions and lifestyle choices may make you more likely to have a fall. Your health care provider may recommend:  Regular vision checks. Poor vision and conditions such as cataracts can make you more likely to have a fall. If you wear glasses, make sure to get your prescription updated if your vision changes.  Medicine review. Work with your health care provider to regularly review all of the medicines you are taking, including over-the-counter medicines. Ask your health care provider about any side effects that may make you more likely to have a fall. Tell your health care provider if any medicines that you take make you feel dizzy or sleepy.  Osteoporosis screening. Osteoporosis is a condition that causes the bones to get weaker. This can  make the bones weak and cause them to break more easily.  Blood pressure screening. Blood pressure changes and medicines to control blood pressure can make you feel dizzy.  Strength and balance checks. Your health care provider may recommend certain tests to check your strength and balance while standing, walking, or changing positions.  Foot health exam. Foot pain and numbness, as well as not wearing proper footwear, can make you more likely to have a fall.  Depression screening. You may be more likely to have a fall if you have a fear of falling, feel emotionally low, or feel unable to do activities that you used to do.  Alcohol use screening. Using too much alcohol can affect your balance and may make you more likely to have a fall. What actions can I take to lower my risk of falls? General instructions  Talk with your health care provider about your risks for falling. Tell your health care provider if: ? You fall. Be sure to tell your health care provider about all falls, even ones that seem minor. ? You feel dizzy, sleepy, or off-balance.  Take over-the-counter and prescription medicines only as told by your health care provider. These include any supplements.  Eat a healthy diet and maintain a healthy weight. A healthy diet includes low-fat dairy products, low-fat (lean) meats, and fiber from whole grains, beans, and lots of fruits and vegetables. Home safety  Remove any tripping hazards, such as rugs, cords, and clutter.  Install safety equipment such as grab bars in bathrooms and safety  rails on stairs.  Keep rooms and walkways well-lit. Activity   Follow a regular exercise program to stay fit. This will help you maintain your balance. Ask your health care provider what types of exercise are appropriate for you.  If you need a cane or walker, use it as recommended by your health care provider.  Wear supportive shoes that have nonskid soles. Lifestyle  Do not drink  alcohol if your health care provider tells you not to drink.  If you drink alcohol, limit how much you have: ? 0-1 drink a day for women. ? 0-2 drinks a day for men.  Be aware of how much alcohol is in your drink. In the [place] one drink equals one typical bottle of beer (12 oz), one-half glass of wine (5 oz), or one shot of hard liquor (1 oz).  Do not use any products that contain nicotine or tobacco, such as cigarettes and e-cigarettes. If you need help quitting, ask your health care provider. Summary  Having a healthy lifestyle and getting preventive care can help to protect your health and wellness after age 13.  Screening and testing are the best way to find a health problem early and help you avoid having a fall. Early diagnosis and treatment give you the best chance for managing medical conditions that are more common for people who are older than age 22.  Falls are a major cause of broken bones and head injuries in people who are older than age 47. Take precautions to prevent a fall at home.  Work with your health care provider to learn what changes you can make to improve your health and wellness and to prevent falls. This information is not intended to replace advice given to you by your health care provider. Make sure you discuss any questions you have with your health care provider. Document Released: 02/22/2017 Document Revised: 08/02/2018 Document Reviewed: 02/22/2017 Elsevier Patient Education  2020 [employer].

## 2018-11-29 NOTE — Assessment & Plan Note (Signed)
Chronic, stable on statin. The 10-year ASCVD risk score Mikey Bussing DC Brooke Bonito., et al., 2013) is: 38%   Values used to calculate the score:     Age: 73 years     Sex: Female     Is Non-Hispanic African American: No     Diabetic: Yes     Tobacco smoker: Yes     Systolic Blood Pressure: 131 mmHg     Is BP treated: Yes     HDL Cholesterol: 67.7 mg/dL     Total Cholesterol: 152 mg/dL

## 2018-11-29 NOTE — Assessment & Plan Note (Signed)
Update TSH, FT4

## 2018-11-29 NOTE — Assessment & Plan Note (Signed)
Update LFT's 

## 2018-11-29 NOTE — Assessment & Plan Note (Addendum)
Albuterol refilled. Continues cutting down on smoking.

## 2018-11-30 ENCOUNTER — Telehealth: Payer: Self-pay | Admitting: Family Medicine

## 2018-11-30 NOTE — Progress Notes (Signed)
     Cheryl Manwarren T. [name], MD Primary Care and Sports Medicine [facility] [place], [address] Phone: [phone]  FAX: Solomon - 73 y.o. female  MRN [id number]  Date of Birth: Jun 21, 1945  Visit Date: 12/03/2018  PCP: Ria Bush, MD  Referred by: Ria Bush, MD  Chief Complaint  Patient presents with  . Trigger Finger    Bilateral Middle fingers   Subjective:   Cheryl Bird is a 73 y.o. very pleasant female patient with Body mass index is 24.94 kg/m. who presents with the following:  She is a nice lady who presents for f/u of recurrent trigger finger. Already had 2 injections, R middle finger, and 1 on L middle.  Procedure only  Tendon Sheath Injection Procedure Note Cheryl Bird 11/10/1945 Date of procedure: 12/03/2018  Procedure: Tendon Sheath Injection for Trigger Finger, RIGHT Indications: Pain  Procedure Details Verbal consent was obtained. Risks (including rare risk of infection, potential risk for skin lightening and potential atrophy), benefits and alternatives were discussed. Prepped with Chloraprep and Ethyl Chloride used for anesthesia. Under sterile conditions, patient injected at palmar crease aiming distally with 45 degree angle towards nodule; injected directly into tendon sheath. Medication flowed freely without resistance.  Needle size: 22 gauge 1 1/2 inch Injection: 1/2 cc of Lidocaine 1% and Depo-Medrol 20 mg Medication: Depo-Medrol 20 mg   Tendon Sheath Injection Procedure Note Cheryl Bird [birth year] Date of procedure: 12/03/2018  Procedure: Tendon Sheath Injection for Trigger Finger, LEFT Indications: Pain  Procedure Details Verbal consent was obtained. Risks (including rare risk of infection, potential risk for skin lightening and potential atrophy), benefits and alternatives were discussed. Prepped with Chloraprep and Ethyl Chloride used for  anesthesia. Under sterile conditions, patient injected at palmar crease aiming distally with 45 degree angle towards nodule; injected directly into tendon sheath. Medication flowed freely without resistance.  Needle size: 22 gauge 1 1/2 inch Injection: 1/2 cc of Lidocaine 1% and Depo-Medrol 20 mg Medication: Depo-Medrol 20 mg    If sx persist, rec surgical release  Signed,  [name], MD

## 2018-11-30 NOTE — Telephone Encounter (Signed)
Pt aware of lab results 

## 2018-11-30 NOTE — Telephone Encounter (Signed)
Patient is returning your call about her lab results  C/B # [phone]

## 2018-12-01 LAB — URINE CULTURE
MICRO NUMBER:: 744255
SPECIMEN QUALITY:: ADEQUATE

## 2018-12-03 ENCOUNTER — Other Ambulatory Visit: Payer: Self-pay

## 2018-12-03 ENCOUNTER — Ambulatory Visit (INDEPENDENT_AMBULATORY_CARE_PROVIDER_SITE_OTHER): Payer: Medicare Other | Admitting: Family Medicine

## 2018-12-03 ENCOUNTER — Encounter: Payer: Self-pay | Admitting: Family Medicine

## 2018-12-03 VITALS — BP 130/60 | HR 82 | Temp 97.9°F | Ht 61.0 in | Wt 132.0 lb

## 2018-12-03 DIAGNOSIS — M65332 Trigger finger, left middle finger: Secondary | ICD-10-CM

## 2018-12-03 DIAGNOSIS — M65331 Trigger finger, right middle finger: Secondary | ICD-10-CM | POA: Diagnosis not present

## 2018-12-03 MED ORDER — METHYLPREDNISOLONE ACETATE 40 MG/ML IJ SUSP
20.0000 mg | Freq: Once | INTRAMUSCULAR | Status: AC
  Administered 2018-12-03: 20 mg via INTRA_ARTICULAR

## 2018-12-03 MED ORDER — METHYLPREDNISOLONE ACETATE 40 MG/ML IJ SUSP
20.0000 mg | Freq: Once | INTRAMUSCULAR | Status: AC
  Administered 2018-12-03: 11:00:00 20 mg via INTRA_ARTICULAR

## 2018-12-05 ENCOUNTER — Other Ambulatory Visit: Payer: Self-pay | Admitting: Family Medicine

## 2018-12-05 MED ORDER — VITAMIN D3 1.25 MG (50000 UT) PO TABS: 1.0000 | ORAL_TABLET | ORAL | 1 refills | Status: DC

## 2018-12-20 ENCOUNTER — Telehealth: Payer: Self-pay | Admitting: *Deleted

## 2018-12-20 NOTE — Telephone Encounter (Signed)
Left message for patient to notify them that it is time to schedule annual low dose lung cancer screening CT scan. Instructed patient to call back to verify information prior to the scan being scheduled.  

## 2018-12-21 ENCOUNTER — Other Ambulatory Visit: Payer: Self-pay

## 2018-12-21 ENCOUNTER — Ambulatory Visit (INDEPENDENT_AMBULATORY_CARE_PROVIDER_SITE_OTHER): Payer: Medicare Other

## 2018-12-21 ENCOUNTER — Other Ambulatory Visit: Payer: Self-pay | Admitting: *Deleted

## 2018-12-21 ENCOUNTER — Telehealth: Payer: Self-pay | Admitting: *Deleted

## 2018-12-21 DIAGNOSIS — Z122 Encounter for screening for malignant neoplasm of respiratory organs: Secondary | ICD-10-CM

## 2018-12-21 DIAGNOSIS — Z87891 Personal history of nicotine dependence: Secondary | ICD-10-CM

## 2018-12-21 DIAGNOSIS — I6523 Occlusion and stenosis of bilateral carotid arteries: Secondary | ICD-10-CM | POA: Diagnosis not present

## 2018-12-21 NOTE — Telephone Encounter (Signed)
Patient has been notified that lung cancer screening CT scan is due currently or will be in near future. Confirmed that patient is within the appropriate age range, and asymptomatic, (no signs or symptoms of lung cancer). Patient denies illness that would prevent curative treatment for lung cancer if found. Verified smoking history (Patient states that she hasn't smoked in 7 days). Patient is agreeable for CT scan being scheduled and will need her appointment after 9-3 and in the afternoons.

## 2018-12-22 ENCOUNTER — Encounter: Payer: Self-pay | Admitting: Family Medicine

## 2018-12-22 DIAGNOSIS — I771 Stricture of artery: Secondary | ICD-10-CM | POA: Insufficient documentation

## 2018-12-28 ENCOUNTER — Other Ambulatory Visit: Payer: Self-pay

## 2018-12-28 ENCOUNTER — Ambulatory Visit
Admission: RE | Admit: 2018-12-28 | Discharge: 2018-12-28 | Disposition: A | Discharge status: Home / Self Care | Payer: Medicare Other | Source: Ambulatory Visit | Attending: Nurse Practitioner | Admitting: Nurse Practitioner

## 2018-12-28 DIAGNOSIS — Z87891 Personal history of nicotine dependence: Secondary | ICD-10-CM | POA: Diagnosis not present

## 2018-12-28 DIAGNOSIS — Z122 Encounter for screening for malignant neoplasm of respiratory organs: Secondary | ICD-10-CM | POA: Diagnosis not present

## 2019-01-02 ENCOUNTER — Encounter: Payer: Self-pay | Admitting: *Deleted

## 2019-01-20 ENCOUNTER — Encounter: Payer: Self-pay | Admitting: Family Medicine

## 2019-01-20 ENCOUNTER — Telehealth: Payer: Self-pay | Admitting: Family Medicine

## 2019-01-20 DIAGNOSIS — K7460 Unspecified cirrhosis of liver: Secondary | ICD-10-CM | POA: Insufficient documentation

## 2019-01-20 NOTE — Telephone Encounter (Signed)
plz call - I reviewed recent lung cancer screening CT and I'd like her to come in at her convenience to review some findings of possible liver disease and plaque in arteries. This is not urgent.

## 2019-01-21 NOTE — Telephone Encounter (Signed)
Patient advised and appointment made for 01/28/2019

## 2019-01-28 ENCOUNTER — Ambulatory Visit (INDEPENDENT_AMBULATORY_CARE_PROVIDER_SITE_OTHER): Payer: Medicare Other | Admitting: Family Medicine

## 2019-01-28 ENCOUNTER — Other Ambulatory Visit: Payer: Self-pay

## 2019-01-28 ENCOUNTER — Encounter: Payer: Self-pay | Admitting: Family Medicine

## 2019-01-28 VITALS — BP 138/70 | HR 90 | Temp 98.0°F | Ht 61.0 in | Wt 135.6 lb

## 2019-01-28 DIAGNOSIS — K7460 Unspecified cirrhosis of liver: Secondary | ICD-10-CM

## 2019-01-28 DIAGNOSIS — I2510 Atherosclerotic heart disease of native coronary artery without angina pectoris: Secondary | ICD-10-CM | POA: Diagnosis not present

## 2019-01-28 DIAGNOSIS — E1169 Type 2 diabetes mellitus with other specified complication: Secondary | ICD-10-CM | POA: Diagnosis not present

## 2019-01-28 DIAGNOSIS — Z23 Encounter for immunization: Secondary | ICD-10-CM

## 2019-01-28 DIAGNOSIS — R011 Cardiac murmur, unspecified: Secondary | ICD-10-CM | POA: Diagnosis not present

## 2019-01-28 DIAGNOSIS — E785 Hyperlipidemia, unspecified: Secondary | ICD-10-CM

## 2019-01-28 DIAGNOSIS — I700 Atherosclerosis of aorta: Secondary | ICD-10-CM

## 2019-01-28 LAB — HEPATIC FUNCTION PANEL
ALT: 43 U/L — ABNORMAL HIGH (ref 0–35)
AST: 35 U/L (ref 0–37)
Albumin: 3.8 g/dL (ref 3.5–5.2)
Alkaline Phosphatase: 87 U/L (ref 39–117)
Bilirubin, Direct: 0.1 mg/dL (ref 0.0–0.3)
Total Bilirubin: 0.4 mg/dL (ref 0.2–1.2)
Total Protein: 6.6 g/dL (ref 6.0–8.3)

## 2019-01-28 LAB — CBC WITH DIFFERENTIAL/PLATELET
Basophils Absolute: 0.1 10*3/uL (ref 0.0–0.1)
Basophils Relative: 0.9 % (ref 0.0–3.0)
Eosinophils Absolute: 0.2 10*3/uL (ref 0.0–0.7)
Eosinophils Relative: 2.7 % (ref 0.0–5.0)
HCT: 36.6 % (ref 36.0–46.0)
Hemoglobin: 11.8 g/dL — ABNORMAL LOW (ref 12.0–15.0)
Lymphocytes Relative: 20.5 % (ref 12.0–46.0)
Lymphs Abs: 1.7 10*3/uL (ref 0.7–4.0)
MCHC: 32.2 g/dL (ref 30.0–36.0)
MCV: 90.1 fl (ref 78.0–100.0)
Monocytes Absolute: 0.6 10*3/uL (ref 0.1–1.0)
Monocytes Relative: 7.3 % (ref 3.0–12.0)
Neutro Abs: 5.8 10*3/uL (ref 1.4–7.7)
Neutrophils Relative %: 68.6 % (ref 43.0–77.0)
Platelets: 253 10*3/uL (ref 150.0–400.0)
RBC: 4.07 Mil/uL (ref 3.87–5.11)
RDW: 15 % (ref 11.5–15.5)
WBC: 8.4 10*3/uL (ref 4.0–10.5)

## 2019-01-28 LAB — PROTIME-INR
INR: 1 ratio (ref 0.8–1.0)
Prothrombin Time: 11.2 s (ref 9.6–13.1)

## 2019-01-28 LAB — GAMMA GT: GGT: 67 U/L — ABNORMAL HIGH (ref 7–51)

## 2019-01-28 LAB — FERRITIN: Ferritin: 11.8 ng/mL (ref 10.0–291.0)

## 2019-01-28 NOTE — Patient Instructions (Addendum)
Update EKG today. Let us know if any new shortness of breath or chest pain/tightness develops.  Recent CT scan showed some plaque buildup in arteries around heart as well as in aorta. Treatment of this is continuing to control blood pressure, sugars, and cholesterol levels.  CT scan also showed possible cirrhosis - I'd like to check blood work today to further evaluation this.

## 2019-01-28 NOTE — Progress Notes (Signed)
This visit was conducted in person.  BP 138/70 (BP Location: Left Arm, Patient Position: Sitting, Cuff Size: Normal)   Pulse 90   Temp 98 F (36.7 C) (Temporal)   Ht 5\' 1"  (1.549 m)   Wt 135 lb 9 oz (61.5 kg)   SpO2 96%   BMI 25.61 kg/m    CC: discuss CT results Subjective:    Patient ID: Cheryl Bird, female    DOB: 03-05-1946, 73 y.o.   MRN: [id number]  HPI: Cheryl Bird is a 73 y.o. female presenting on 01/28/2019 for Results (Wants to discuss chest CT results. )   Recent lung cancer screening CT suspicious for cirrhosis. It also found CAD and aortic ATH. Pt denies alcohol use. Mother did have alcoholic cirrhosis. No other known fmhx liver disease.   She notes some dependent pedal edema over the past week.   Pt denies chest pain, dyspnea.   Albuterol has significantly helped dyspnea from known COPD.  She did previously work in [employer] (over 20 yrs ago).   CT CHEST LUNG CANCER SCREENING LOW DOSE WO CONTRAST CLINICAL DATA:  73 year old female current smoker with 57 pack year history of smoking. Lung cancer screening examination.  EXAM: CT CHEST WITHOUT CONTRAST LOW-DOSE FOR LUNG CANCER SCREENING  TECHNIQUE: Multidetector CT imaging of the chest was performed following the standard protocol without IV contrast.  COMPARISON:  Low-dose lung cancer screening chest CT 12/06/2017.  FINDINGS: Cardiovascular: Heart size is normal. There is no significant pericardial fluid, thickening or pericardial calcification. There is aortic atherosclerosis, as well as atherosclerosis of the great vessels of the mediastinum and the coronary arteries, including calcified atherosclerotic plaque in the left main, left anterior descending and right coronary arteries. Severe calcifications of the aortic valve and mitral annulus.  Mediastinum/Nodes: No pathologically enlarged mediastinal or hilar lymph nodes. Please note that accurate exclusion of hilar adenopathy is limited on  noncontrast CT scans. Esophagus is unremarkable in appearance. No axillary lymphadenopathy.  Lungs/Pleura: Small pulmonary nodules are noted in the lungs, largest of which is in the periphery of the left upper lobe (axial image 70 of series 3), with a volume derived mean diameter of 3.9 mm. No larger more suspicious appearing pulmonary nodules or masses are noted. No acute consolidative airspace disease. No pleural effusions. Mild diffuse bronchial wall thickening with mild centrilobular and paraseptal emphysema. Bilateral apical nodular pleuroparenchymal thickening and architectural distortion, stable compared to the prior study, most compatible with chronic post infectious or inflammatory scarring.  Upper Abdomen: Liver has a shrunken appearance and nodular contour, indicative of underlying cirrhosis. Aortic atherosclerosis.  Musculoskeletal: There are no aggressive appearing lytic or blastic lesions noted in the visualized portions of the skeleton.  IMPRESSION: 1. Lung-RADS 2S, benign appearance or behavior. Continue annual screening with low-dose chest CT without contrast in 12 months. 2. The "S" modifier above refers to potentially clinically significant non lung cancer related findings. Specifically, there is aortic atherosclerosis, in addition to left main and 2 vessel coronary artery disease. Assessment for potential risk factor modification, dietary therapy or pharmacologic therapy may be warranted, if clinically indicated. 3. Mild diffuse bronchial wall thickening with mild centrilobular and paraseptal emphysema; imaging findings suggestive of underlying COPD. 4. Cirrhosis. 5. There are calcifications of the aortic valve and mitral annulus. Echocardiographic correlation for evaluation of potential valvular dysfunction may be warranted if clinically indicated.  Aortic Atherosclerosis (ICD10-I70.0) and Emphysema (ICD10-J43.9).  Electronically Signed   By: Mauri Brooklyn.D.  On: 12/28/2018 13:40       Relevant past medical, surgical, family and social history reviewed and updated as indicated. Interim medical history since our last visit reviewed. Allergies and medications reviewed and updated. Outpatient Medications Prior to Visit  Medication Sig Dispense Refill  . albuterol (VENTOLIN HFA) 108 (90 Base) MCG/ACT inhaler Inhale 2 puffs into the lungs every 6 (six) hours as needed for wheezing. 18 g 6  . amLODipine (NORVASC) 5 MG tablet Take 1 tablet (5 mg total) by mouth daily. 90 tablet 3  . aspirin EC 81 MG tablet Take 1 tablet (81 mg total) by mouth every other day.    Marland Kitchen atorvastatin (LIPITOR) 20 MG tablet Take 1 tablet (20 mg total) by mouth daily at 6 PM. 90 tablet 3  . Calcium Carbonate-Vitamin D (CALCIUM 600/VITAMIN D) 600-400 MG-UNIT chew tablet Chew 1 tablet by mouth daily.    . Cholecalciferol (VITAMIN D3) 1.25 MG (50000 UT) TABS Take 1 tablet by mouth once a week. 12 tablet 1  . fluticasone (FLONASE) 50 MCG/ACT nasal spray SPRAY 2 SPRAYS INTO EACH NOSTRIL EVERY DAY 48 mL 1  . lisinopril (ZESTRIL) 2.5 MG tablet Take 1 tablet (2.5 mg total) by mouth daily. 90 tablet 3  . metFORMIN (GLUCOPHAGE) 1000 MG tablet Take 1 tablet (1,000 mg total) by mouth 2 (two) times daily with a meal. 180 tablet 3  . oxybutynin (DITROPAN) 5 MG tablet Take 1 tablet (5 mg total) by mouth 2 (two) times daily as needed (incontinence). 60 tablet 8  . pantoprazole (PROTONIX) 40 MG tablet Take 1 tablet (40 mg total) by mouth every other day. 45 tablet 3  . sitaGLIPtin (JANUVIA) 50 MG tablet Take 1 tablet (50 mg total) by mouth daily. 90 tablet 3  . cephALEXin (KEFLEX) 500 MG capsule Take 1 capsule (500 mg total) by mouth 2 (two) times daily. 14 capsule 0   No facility-administered medications prior to visit.      Per HPI unless specifically indicated in ROS section below Review of Systems Objective:    BP 138/70 (BP Location: Left Arm, Patient Position: Sitting,  Cuff Size: Normal)   Pulse 90   Temp 98 F (36.7 C) (Temporal)   Ht 5\' 1"  (1.549 m)   Wt 135 lb 9 oz (61.5 kg)   SpO2 96%   BMI 25.61 kg/m   Wt Readings from Last 3 Encounters:  01/28/19 135 lb 9 oz (61.5 kg)  12/28/18 131 lb (59.4 kg)  12/03/18 132 lb (59.9 kg)    Physical Exam Vitals signs and nursing note reviewed.  Constitutional:      Appearance: Normal appearance. She is not ill-appearing.  Abdominal:     General: Abdomen is flat. Bowel sounds are normal. There is no distension.     Palpations: Abdomen is soft. There is no mass.     Tenderness: There is no abdominal tenderness. There is no guarding or rebound.     Hernia: No hernia is present.  Neurological:     Mental Status: She is alert.  Psychiatric:        Mood and Affect: Mood normal.        Behavior: Behavior normal.       Results for orders placed or performed in visit on 01/28/19  CBC with Differential/Platelet  Result Value Ref Range   WBC 8.4 4.0 - 10.5 K/uL   RBC 4.07 3.87 - 5.11 Mil/uL   Hemoglobin 11.8 (L) 12.0 - 15.0 g/dL   HCT  36.6 36.0 - 46.0 %   MCV 90.1 78.0 - 100.0 fl   MCHC 32.2 30.0 - 36.0 g/dL   RDW 15.0 11.5 - 15.5 %   Platelets 253.0 150.0 - 400.0 K/uL   Neutrophils Relative % 68.6 43.0 - 77.0 %   Lymphocytes Relative 20.5 12.0 - 46.0 %   Monocytes Relative 7.3 3.0 - 12.0 %   Eosinophils Relative 2.7 0.0 - 5.0 %   Basophils Relative 0.9 0.0 - 3.0 %   Neutro Abs 5.8 1.4 - 7.7 K/uL   Lymphs Abs 1.7 0.7 - 4.0 K/uL   Monocytes Absolute 0.6 0.1 - 1.0 K/uL   Eosinophils Absolute 0.2 0.0 - 0.7 K/uL   Basophils Absolute 0.1 0.0 - 0.1 K/uL  Ferritin  Result Value Ref Range   Ferritin 11.8 10.0 - 291.0 ng/mL  Hepatic function panel  Result Value Ref Range   Total Bilirubin 0.4 0.2 - 1.2 mg/dL   Bilirubin, Direct 0.1 0.0 - 0.3 mg/dL   Alkaline Phosphatase 87 39 - 117 U/L   AST 35 0 - 37 U/L   ALT 43 (H) 0 - 35 U/L   Total Protein 6.6 6.0 - 8.3 g/dL   Albumin 3.8 3.5 - 5.2 g/dL  Gamma  GT  Result Value Ref Range   GGT 67 (H) 7 - 51 U/L  Protime-INR  Result Value Ref Range   INR 1.0 0.8 - 1.0 ratio   Prothrombin Time 11.2 9.6 - 13.1 sec   Lab Results  Component Value Date   CREATININE 1.21 (H) 11/29/2018   BUN 23 11/29/2018   NA 141 11/29/2018   K 4.6 11/29/2018   CL 108 11/29/2018   CO2 26 11/29/2018    Lab Results  Component Value Date   HGBA1C 7.4 (A) 11/29/2018    Echocardiogram 08/2017 - mild-mod LVH, EF 55-60%, normal wall motion, G1DD, calcified mitral valve, poorly visualized aortic valve  EKG today - NSR rate 90, RBBB (chronic), LAD with bi-fascicular block, T wave inversions septally, unchanged from prior 08/2017 Assessment & Plan:   Problem List Items Addressed This Visit    Thoracic aortic atherosclerosis ([place])    Continue aspirin, statin.       Hyperlipidemia associated with type 2 diabetes mellitus ([place])   Heart murmur, systolic    CT [id number]: Severe calcifications of the aortic valve and mitral annulus.      Cirrhosis of liver ([place]) - Primary    New finding on this year's CT scan (not present on prior imaging even from last year). Discussed with patient. No alcohol history. ?NAFLD. Check further labwork today (ferritin, GGT, INR). MELD score = 8.        Relevant Orders   CBC with Differential/Platelet (Completed)   Ferritin (Completed)   Hepatic function panel (Completed)   Hepatitis panel, acute   Gamma GT (Completed)   Protime-INR (Completed)   CAD (coronary artery disease)    By CT - update EKG today. Consider cardiology referral pending results. Pt currently asymptomatic. Continue aspirin, statin, and good glycemic control.       Relevant Orders   EKG 12-Lead (Completed)    Other Visit Diagnoses    Need for influenza vaccination       Relevant Orders   Flu Vaccine QUAD High Dose(Fluad) (Completed)       No orders of the defined types were placed in this encounter.  Orders Placed This Encounter  Procedures  . Flu  Vaccine QUAD High Dose(Fluad)  .  CBC with Differential/Platelet  . Ferritin  . Hepatic function panel  . Hepatitis panel, acute  . Gamma GT  . Protime-INR  . EKG 12-Lead    Patient Instructions  Update EKG today. Let us know if any new shortness of breath or chest pain/tightness develops.  Recent CT scan showed some plaque buildup in arteries around heart as well as in aorta. Treatment of this is continuing to control blood pressure, sugars, and cholesterol levels.  CT scan also showed possible cirrhosis - I'd like to check blood work today to further evaluation this.    Follow up plan: Return if symptoms worsen or fail to improve.  Ria Bush, MD

## 2019-01-28 NOTE — Assessment & Plan Note (Addendum)
By CT - update EKG today. Consider cardiology referral pending results. Pt currently asymptomatic. Continue aspirin, statin, and good glycemic control.

## 2019-01-29 LAB — HEPATITIS PANEL, ACUTE
Hep A IgM: NONREACTIVE
Hep B C IgM: NONREACTIVE
Hepatitis B Surface Ag: NONREACTIVE
Hepatitis C Ab: NONREACTIVE
SIGNAL TO CUT-OFF: 0.02 (ref <1.00)

## 2019-01-29 NOTE — Assessment & Plan Note (Signed)
Continue aspirin, statin.  

## 2019-01-29 NOTE — Assessment & Plan Note (Signed)
New finding on this year's CT scan (not present on prior imaging even from last year). Discussed with patient. No alcohol history. ?NAFLD. Check further labwork today (ferritin, GGT, INR). MELD score = 8.

## 2019-01-29 NOTE — Assessment & Plan Note (Signed)
CT 12/2018: Severe calcifications of the aortic valve and mitral annulus.

## 2019-04-12 ENCOUNTER — Telehealth: Payer: Self-pay

## 2019-04-12 NOTE — Telephone Encounter (Signed)
Received faxed Physician Order form from [place].  Placed form in Dr. Synthia Innocent box.

## 2019-04-12 NOTE — Telephone Encounter (Signed)
Spoke with pt asking if she uses [facility] for her DM supplies.  States she does not. She gets her supplies at [facility].    Shredded order form.

## 2019-04-12 NOTE — Telephone Encounter (Addendum)
Filled and in Lisa's box. plz first verify with patient that this is legitimate request.  Remember to verify prior to sending to me.

## 2019-05-16 ENCOUNTER — Other Ambulatory Visit: Payer: Self-pay | Admitting: Family Medicine

## 2019-05-16 DIAGNOSIS — Z1231 Encounter for screening mammogram for malignant neoplasm of breast: Secondary | ICD-10-CM

## 2019-05-24 ENCOUNTER — Other Ambulatory Visit: Payer: Self-pay | Admitting: Family Medicine

## 2019-05-28 ENCOUNTER — Other Ambulatory Visit: Payer: Self-pay | Admitting: Family Medicine

## 2019-06-05 ENCOUNTER — Other Ambulatory Visit: Payer: Self-pay

## 2019-06-05 ENCOUNTER — Ambulatory Visit (INDEPENDENT_AMBULATORY_CARE_PROVIDER_SITE_OTHER): Payer: Medicare Other | Admitting: Family Medicine

## 2019-06-05 ENCOUNTER — Encounter: Payer: Self-pay | Admitting: Family Medicine

## 2019-06-05 VITALS — BP 120/60 | HR 88 | Temp 97.8°F | Ht 61.0 in | Wt 132.3 lb

## 2019-06-05 DIAGNOSIS — E113299 Type 2 diabetes mellitus with mild nonproliferative diabetic retinopathy without macular edema, unspecified eye: Secondary | ICD-10-CM

## 2019-06-05 DIAGNOSIS — K7460 Unspecified cirrhosis of liver: Secondary | ICD-10-CM | POA: Diagnosis not present

## 2019-06-05 DIAGNOSIS — E118 Type 2 diabetes mellitus with unspecified complications: Secondary | ICD-10-CM

## 2019-06-05 LAB — POCT GLYCOSYLATED HEMOGLOBIN (HGB A1C): Hemoglobin A1C: 7.3 % — AB (ref 4.0–5.6)

## 2019-06-05 NOTE — Assessment & Plan Note (Signed)
Abd US WNL 2019 - update today given recent CT findings. Recent labs were stable. Discussed possible return to GI for further evaluation of cirrhosis.

## 2019-06-05 NOTE — Assessment & Plan Note (Signed)
Chronic, stable, adequate for age. Continue metformin and januvia.

## 2019-06-05 NOTE — Progress Notes (Signed)
This visit was conducted in person.  BP 120/60 (BP Location: Left Arm, Patient Position: Sitting, Cuff Size: Normal)   Pulse 88   Temp 97.8 F (36.6 C) (Temporal)   Ht 5\' 1"  (1.549 m)   Wt 132 lb 5 oz (60 kg)   SpO2 93%   BMI 25.00 kg/m    CC: 6 mo f/u visit Subjective:    Patient ID: Cheryl Bird, female    DOB: 05-07-1945, 74 y.o.   MRN: [id number]  HPI: Cheryl Bird is a 74 y.o. female presenting on 06/05/2019 for Follow-up (Here for 6 mo f/u.)   Sad because she hasn't been able to see her sister who is on dialysis.   Recent lung cancer screening CT suspicious for cirrhosis as well as CAD and aortic ATH. She is on aspirin and statin. Cirrhosis labs revealed mildly elevated GGT and ALT and normal INR, viral hep panel. Ferritin 11.8. Mild anemia Hgb 11.8. EKG from last visit showed RBBB and LAFB largely unchanged from 08/2017, echo at that time was stable.   Cirrhosis thought to be from ?NAFLD but did not have findings indicative of fatty liver on abd [place] 11/2017.  No chest pain or tightness. Mild leg swelling.   DM - does regularly check sugars, "overall running good". Compliant with antihyperglycemic regimen which includes: metformin 1000mg  bid and januvia 50mg  daily. Denies low sugars or hypoglycemic symptoms. Denies paresthesias. Last diabetic eye exam DUE - has appt next month. Pneumovax: 2013. Prevnar: 2015. Glucometer brand: Verio. DSME: completed at [facility]. Compliant with lipitor.  Lab Results  Component Value Date   HGBA1C 7.3 (A) 06/05/2019   Diabetic Foot Exam - Simple   Simple Foot Form Diabetic Foot exam was performed with the following findings: Yes 06/05/2019 10:32 AM  Visual Inspection No deformities, no ulcerations, no other skin breakdown bilaterally: Yes Sensation Testing Intact to touch and monofilament testing bilaterally: Yes Pulse Check Posterior Tibialis and Dorsalis pulse intact bilaterally: Yes Comments    Lab Results  Component Value Date   MICROALBUR 313.7 (H) 06/08/2018        Relevant past medical, surgical, family and social history reviewed and updated as indicated. Interim medical history since our last visit reviewed. Allergies and medications reviewed and updated. Outpatient Medications Prior to Visit  Medication Sig Dispense Refill  . albuterol (VENTOLIN HFA) 108 (90 Base) MCG/ACT inhaler Inhale 2 puffs into the lungs every 6 (six) hours as needed for wheezing. 18 g 6  . amLODipine (NORVASC) 5 MG tablet Take 1 tablet (5 mg total) by mouth daily. 90 tablet 3  . aspirin EC 81 MG tablet Take 1 tablet (81 mg total) by mouth every other day.    Marland Kitchen atorvastatin (LIPITOR) 20 MG tablet Take 1 tablet (20 mg total) by mouth daily at 6 PM. 90 tablet 3  . Calcium Carbonate-Vitamin D (CALCIUM 600/VITAMIN D) 600-400 MG-UNIT chew tablet Chew 1 tablet by mouth daily.    . D3-50 1.25 MG (50000 UT) capsule TAKE 1 TABLET BY MOUTH ONE TIME PER WEEK 4 capsule 0  . fluticasone (FLONASE) 50 MCG/ACT nasal spray SPRAY 2 SPRAYS INTO EACH NOSTRIL EVERY DAY 48 mL 1  . lisinopril (ZESTRIL) 2.5 MG tablet Take 1 tablet (2.5 mg total) by mouth daily. 90 tablet 3  . metFORMIN (GLUCOPHAGE) 1000 MG tablet Take 1 tablet (1,000 mg total) by mouth 2 (two) times daily with a meal. 180 tablet 3  . oxybutynin (DITROPAN) 5 MG tablet Take 1  tablet (5 mg total) by mouth 2 (two) times daily as needed (incontinence). 60 tablet 8  . pantoprazole (PROTONIX) 40 MG tablet Take 1 tablet (40 mg total) by mouth every other day. 45 tablet 3  . sitaGLIPtin (JANUVIA) 50 MG tablet Take 1 tablet (50 mg total) by mouth daily. 90 tablet 3   No facility-administered medications prior to visit.     Per HPI unless specifically indicated in ROS section below Review of Systems Objective:    BP 120/60 (BP Location: Left Arm, Patient Position: Sitting, Cuff Size: Normal)   Pulse 88   Temp 97.8 F (36.6 C) (Temporal)   Ht 5\' 1"  (1.549 m)   Wt 132 lb 5 oz (60 kg)   SpO2 93%    BMI 25.00 kg/m   Wt Readings from Last 3 Encounters:  06/05/19 132 lb 5 oz (60 kg)  01/28/19 135 lb 9 oz (61.5 kg)  12/28/18 131 lb (59.4 kg)    Physical Exam Vitals and nursing note reviewed.  Constitutional:      General: She is not in acute distress.    Appearance: Normal appearance. She is well-developed. She is not ill-appearing.  HENT:     Head: Normocephalic and atraumatic.  Eyes:     General: No scleral icterus.    Conjunctiva/sclera: Conjunctivae normal.  Cardiovascular:     Rate and Rhythm: Normal rate and regular rhythm.     Pulses: Normal pulses.     Heart sounds: Murmur (3/6 systolic at USB) present.  Pulmonary:     Effort: Pulmonary effort is normal. No respiratory distress.     Breath sounds: Normal breath sounds. No wheezing, rhonchi or rales.  Musculoskeletal:     Cervical back: Normal range of motion and neck supple.     Right lower leg: Edema (1+) present.     Left lower leg: Edema (1+) present.     Comments: See HPI for foot exam if done  Lymphadenopathy:     Cervical: No cervical adenopathy.  Skin:    General: Skin is warm and dry.     Findings: No rash.  Neurological:     Mental Status: She is alert.  Psychiatric:        Mood and Affect: Mood normal.        Behavior: Behavior normal.       Results for orders placed or performed in visit on 06/05/19  POCT glycosylated hemoglobin (Hb A1C)  Result Value Ref Range   Hemoglobin A1C 7.3 (A) 4.0 - 5.6 %   HbA1c POC (<> result, manual entry)     HbA1c, POC (prediabetic range)     HbA1c, POC (controlled diabetic range)     Assessment & Plan:  This visit occurred during the SARS-CoV-2 public health emergency.  Safety protocols were in place, including screening questions prior to the visit, additional usage of staff PPE, and extensive cleaning of exam room while observing appropriate contact time as indicated for disinfecting solutions.   Problem List Items Addressed This Visit    Controlled diabetes  mellitus type 2 with complications (HCC) - Primary    Chronic, stable, adequate for age. Continue metformin and januvia.       Relevant Orders   POCT glycosylated hemoglobin (Hb A1C) (Completed)   Cirrhosis of liver ([place])    Abd US WNL 2019 - update today given recent CT findings. Recent labs were stable. Discussed possible return to GI for further evaluation of cirrhosis.  Relevant Orders   US Abdomen Limited RUQ   Background diabetic retinopathy ([place])    Upcoming eye appt next month.           No orders of the defined types were placed in this encounter.  Orders Placed This Encounter  Procedures  . US Abdomen Limited RUQ    Standing Status:   Future    Standing Expiration Date:   08/02/2020    Order Specific Question:   Reason for Exam (SYMPTOM  OR DIAGNOSIS REQUIRED)    Answer:   cirrhosis    Order Specific Question:   Preferred imaging location?    Answer:   [facility]  . POCT glycosylated hemoglobin (Hb A1C)    Patient Instructions  Sugars are doing well.  I'd like to check liver ultrasound to further evaluate liver (in possible cirrhosis by CT scan 12/2018).  We may refer you to GI doctor depending on results.  Return in 6 months for wellness visit.    Follow up plan: Return in about 6 months (around 12/03/2019) for annual exam, prior fasting for blood work, medicare wellness visit.  Ria Bush, MD

## 2019-06-05 NOTE — Patient Instructions (Signed)
Sugars are doing well.  I'd like to check liver ultrasound to further evaluate liver (in possible cirrhosis by CT scan 12/2018).  We may refer you to GI doctor depending on results.  Return in 6 months for wellness visit.

## 2019-06-05 NOTE — Assessment & Plan Note (Signed)
Upcoming eye appt next month.

## 2019-06-07 ENCOUNTER — Other Ambulatory Visit: Payer: Self-pay

## 2019-06-07 ENCOUNTER — Ambulatory Visit
Admission: RE | Admit: 2019-06-07 | Discharge: 2019-06-07 | Disposition: A | Discharge status: Home / Self Care | Payer: Medicare Other | Source: Ambulatory Visit | Attending: Family Medicine | Admitting: Family Medicine

## 2019-06-07 DIAGNOSIS — K7460 Unspecified cirrhosis of liver: Secondary | ICD-10-CM | POA: Insufficient documentation

## 2019-06-07 DIAGNOSIS — K7689 Other specified diseases of liver: Secondary | ICD-10-CM | POA: Diagnosis not present

## 2019-06-10 ENCOUNTER — Telehealth: Payer: Self-pay | Admitting: Lab

## 2019-06-10 NOTE — Telephone Encounter (Signed)
LVM for pt to call for abdominal US results.  Per Dr. Cristopher Peru, Garlon Hatchet, MD  06/09/2019 1:31 PM EST    Plz notify liver ultrasound returned overall stable. There was a small benign cyst in the left liver as well as ongoing signs of cirrhosis.  We will check further labs at her next appointment to continue evaluation of cirrhosis.

## 2019-06-10 NOTE — Telephone Encounter (Signed)
Called Pt No answer left a VM to call office.

## 2019-06-10 NOTE — Telephone Encounter (Signed)
Pt returned your call best number  [phone]

## 2019-06-10 NOTE — Telephone Encounter (Signed)
Patient called back and she was notified of these results and she verbalized understanding.

## 2019-06-14 ENCOUNTER — Ambulatory Visit: Payer: Medicare Other

## 2019-06-18 ENCOUNTER — Other Ambulatory Visit: Payer: Self-pay | Admitting: Family Medicine

## 2019-06-18 NOTE — Telephone Encounter (Signed)
RX for Vitamin D was filled on 11/29/2018 and that was last time level was checked. Per lab note it said to take this for 6 months which would be 6 months this month.  Please review

## 2019-06-19 ENCOUNTER — Encounter: Payer: Medicare Other | Admitting: Family Medicine

## 2019-06-20 ENCOUNTER — Encounter: Payer: Self-pay | Admitting: Family Medicine

## 2019-06-20 ENCOUNTER — Ambulatory Visit
Admission: RE | Admit: 2019-06-20 | Discharge: 2019-06-20 | Disposition: A | Discharge status: Home / Self Care | Payer: Medicare Other | Source: Ambulatory Visit | Attending: Family Medicine | Admitting: Family Medicine

## 2019-06-20 DIAGNOSIS — Z1231 Encounter for screening mammogram for malignant neoplasm of breast: Secondary | ICD-10-CM | POA: Insufficient documentation

## 2019-06-20 LAB — HM MAMMOGRAPHY

## 2019-06-24 DIAGNOSIS — E113293 Type 2 diabetes mellitus with mild nonproliferative diabetic retinopathy without macular edema, bilateral: Secondary | ICD-10-CM | POA: Diagnosis not present

## 2019-06-24 LAB — HM DIABETES EYE EXAM

## 2019-06-26 ENCOUNTER — Other Ambulatory Visit: Payer: Self-pay | Admitting: Family Medicine

## 2019-07-18 ENCOUNTER — Ambulatory Visit: Payer: Medicare Other

## 2019-08-09 DIAGNOSIS — Z86010 Personal history of colonic polyps: Secondary | ICD-10-CM | POA: Diagnosis not present

## 2019-08-09 DIAGNOSIS — R1032 Left lower quadrant pain: Secondary | ICD-10-CM | POA: Diagnosis not present

## 2019-08-09 DIAGNOSIS — R1031 Right lower quadrant pain: Secondary | ICD-10-CM | POA: Diagnosis not present

## 2019-08-09 DIAGNOSIS — K591 Functional diarrhea: Secondary | ICD-10-CM | POA: Diagnosis not present

## 2019-08-09 DIAGNOSIS — R748 Abnormal levels of other serum enzymes: Secondary | ICD-10-CM | POA: Diagnosis not present

## 2019-08-26 ENCOUNTER — Encounter: Payer: Self-pay | Admitting: Family Medicine

## 2019-08-27 DIAGNOSIS — R748 Abnormal levels of other serum enzymes: Secondary | ICD-10-CM | POA: Diagnosis not present

## 2019-09-05 ENCOUNTER — Other Ambulatory Visit: Payer: Self-pay | Admitting: Family Medicine

## 2019-09-05 NOTE — Telephone Encounter (Signed)
According to Labs, 11/29/18, pt was to restart wkly vit D for 6 mos.  Levels have not been checked recently.  Should I refill?

## 2019-09-06 NOTE — Telephone Encounter (Signed)
Refilled another 3 months. Will need vit D levels checked next labs.

## 2019-09-09 ENCOUNTER — Telehealth: Payer: Self-pay | Admitting: Family Medicine

## 2019-09-09 NOTE — Progress Notes (Signed)
  Chronic Care Management   Outreach Note  09/09/2019 Name: Cheryl Bird MRN: [id number] DOB: 1945-09-27  Referred by: Ria Bush, MD Reason for referral : No chief complaint on file.   An unsuccessful telephone outreach was attempted today. The patient was referred to the pharmacist for assistance with care management and care coordination.   This note is not being shared with the patient for the following reason: To respect privacy (The patient or proxy has requested that the information not be shared).  Follow Up Plan:   Earney [place] Upstream Scheduler

## 2019-09-09 NOTE — Progress Notes (Signed)
  Chronic Care Management   Note  09/09/2019 Name: Cheryl Bird MRN: [id number] DOB: 27-Jun-1945  Cheryl Bird is a 74 y.o. year old female who is a primary care patient of Ria Bush, MD. I reached out to Cheryl Bird by phone today in response to a referral sent by Cheryl Bird PCP, Ria Bush, MD.   Cheryl Bird was given information about Chronic Care Management services today including:  1. CCM service includes personalized support from designated clinical staff supervised by her physician, including individualized plan of care and coordination with other care providers 2. 24/7 contact phone numbers for assistance for urgent and routine care needs. 3. Service will only be billed when office clinical staff spend 20 minutes or more in a month to coordinate care. 4. Only one practitioner may furnish and bill the service in a calendar month. 5. The patient may stop CCM services at any time (effective at the end of the month) by phone call to the office staff.   Patient agreed to services and verbal consent obtained.   This note is not being shared with the patient for the following reason: To respect privacy (The patient or proxy has requested that the information not be shared).  Follow up plan:   Earney [place] Upstream Scheduler

## 2019-11-04 ENCOUNTER — Ambulatory Visit: Payer: Medicare Other

## 2019-11-04 ENCOUNTER — Other Ambulatory Visit: Payer: Self-pay

## 2019-11-04 ENCOUNTER — Telehealth: Payer: Self-pay

## 2019-11-04 DIAGNOSIS — E118 Type 2 diabetes mellitus with unspecified complications: Secondary | ICD-10-CM

## 2019-11-04 DIAGNOSIS — J449 Chronic obstructive pulmonary disease, unspecified: Secondary | ICD-10-CM

## 2019-11-04 DIAGNOSIS — I10 Essential (primary) hypertension: Secondary | ICD-10-CM

## 2019-11-04 NOTE — Chronic Care Management (AMB) (Signed)
Chronic Care Management Pharmacy  Name: Cheryl Bird  MRN: [id number] DOB: Oct 12, 1945  Chief Complaint/ HPI  Cheryl Bird,  74 y.o., female presents for their Initial CCM visit with the clinical pharmacist via telephone.  PCP : Ria Bush, MD  Their chronic conditions include: hypertension, CAD, COPD, GERD, Barrett's Esophagus, cirrhosis of the liver, diabetes type 2, HLD, DM retinopathy, CKD stage 3, osteopenia, tobacco use, vitamin D deficiency   Office Visits: 06/05/19: Danise Mina - DM, does not check sugars regularly, compliant with metformin and januvia, foot exam completed, A1c 7.3% adequate control for age  Consult Visit: 06/24/19: Diabetic eye exam - note unavailable 06/20/19: Mammogram screening   Medications: Outpatient Encounter Medications as of 11/04/2019  Medication Sig   albuterol (VENTOLIN HFA) 108 (90 Base) MCG/ACT inhaler Inhale 2 puffs into the lungs every 6 (six) hours as needed for wheezing.   amLODipine (NORVASC) 5 MG tablet Take 1 tablet (5 mg total) by mouth daily.   aspirin EC 81 MG tablet Take 1 tablet (81 mg total) by mouth every other day.   atorvastatin (LIPITOR) 20 MG tablet Take 1 tablet (20 mg total) by mouth daily at 6 PM.   Cholecalciferol (VITAMIN D3) 1.25 MG (50000 UT) CAPS TAKE 1 CAPULE BY MOUTH ONE TIME PER WEEK   fluticasone (FLONASE) 50 MCG/ACT nasal spray SPRAY 2 SPRAYS INTO EACH NOSTRIL EVERY DAY   lisinopril (ZESTRIL) 2.5 MG tablet Take 1 tablet (2.5 mg total) by mouth daily.   metFORMIN (GLUCOPHAGE) 1000 MG tablet Take 1 tablet (1,000 mg total) by mouth 2 (two) times daily with a meal.   omeprazole (PRILOSEC) 20 MG capsule Take by mouth.   oxybutynin (DITROPAN) 5 MG tablet TAKE 1 TABLET (5 MG TOTAL) BY MOUTH 2 (TWO) TIMES DAILY AS NEEDED (INCONTINENCE).   sitaGLIPtin (JANUVIA) 50 MG tablet Take 1 tablet (50 mg total) by mouth daily.   Calcium Carbonate-Vitamin D (CALCIUM 600/VITAMIN D) 600-400 MG-UNIT chew tablet Chew  1 tablet by mouth daily. (Patient not taking: Reported on 11/04/2019)   pantoprazole (PROTONIX) 40 MG tablet Take 1 tablet (40 mg total) by mouth every other day. (Patient not taking: Reported on 11/04/2019)   No facility-administered encounter medications on file as of 11/04/2019.   Current Diagnosis/Assessment:  SDOH Interventions     Most Recent Value  SDOH Interventions  Financial Strain Interventions Intervention Not Indicated     Goals Addressed            This Visit's Progress    Pharmacy Care Plan       CARE PLAN ENTRY  Current Barriers:   Chronic Disease Management support, education, and care coordination needs related to Hypertension, Diabetes, and COPD   Hypertension BP Readings from Last 3 Encounters:  06/05/19 120/60  01/28/19 138/70  12/03/18 130/60   Pharmacist Clinical Goal(s): o Over the next 6 months, patient will work with PharmD and providers to maintain BP goal <140/90 mmHg  Current regimen:   Amlodipine 5 mg - 1 tablet daily  Lisinopril 2.5 mg - 1 tablet daily  Interventions: o Reviewed clinic blood pressure readings and home monitoring   Patient self care activities - Over the next 6 months, patient will: o Check blood pressure 7 days prior to appointments, document, and provide at future appointments o Ensure daily salt intake < 2300 mg/day  Diabetes Lab Results  Component Value Date/Time   HGBA1C 7.3 (A) 06/05/2019 10:20 AM   HGBA1C 7.4 (A) 11/29/2018 11:30 AM  HGBA1C 7.2 (H) 06/08/2018 09:21 AM   HGBA1C 7.2 (H) 11/29/2017 08:24 AM   Pharmacist Clinical Goal(s): o Over the next 6 months, patient will work with PharmD and providers to maintain A1c goal  < 7.5%   Current regimen:   Metformin 1000 mg - 1 tablet twice daily   Januvia 50 mg - 1 tablet daily  Interventions: o Reviewed home blood glucose log   Patient self care activities - Over the next 6 months, patient will: o Check blood sugar once daily, document, and  provide at future appointments o Contact provider with any episodes of hypoglycemia  COPD  Pharmacist Clinical Goal(s) o Over the next 6 months, patient will work with PharmD and providers to reduce shortness of breath and hospitalizations  Current regimen:  o Albuterol HFA 90 mcg - 2 puffs every 6 hours as needed for wheezing  Interventions: o Discussed tobacco cessation o Discussed only using albuterol for shortness of breath, cough, wheezing, chest tightness  Patient self care activities - Over the next 6 months, patient will: o Continue albuterol inhaler for shortness of breath and wheezing  o Consider tobacco cessation to improve your shortness of breath and overall health  Initial goal documentation      Hypertension   CMP Latest Ref Rng & Units 01/28/2019 11/29/2018 06/08/2018  Glucose 70 - 99 mg/dL - 138(H) 138(H)  BUN 6 - 23 mg/dL - 23 20  Creatinine 0.40 - 1.20 mg/dL - 1.21(H) 1.16  Sodium 135 - 145 mEq/L - 141 139  Potassium 3.5 - 5.1 mEq/L - 4.6 4.6  Chloride 96 - 112 mEq/L - 108 102  CO2 19 - 32 mEq/L - 26 28  Calcium 8.4 - 10.5 mg/dL - 9.3 9.2  Total Protein 6.0 - 8.3 g/dL 6.6 6.9 6.8  Total Bilirubin 0.2 - 1.2 mg/dL 0.4 0.4 0.4  Alkaline Phos 39 - 117 U/L 87 92 83  AST 0 - 37 U/L 35 37 45(H)  ALT 0 - 35 U/L 43(H) 34 39(H)   Office blood pressures are: BP Readings from Last 3 Encounters:  06/05/19 120/60  01/28/19 138/70  12/03/18 130/60   BP goal < 140/90 mmHg Patient has failed these meds in the past:  Patient checks BP at home when feeling symptomatic, has arm cuff, talking monitor Patient home BP readings are ranging: reports similar to what she sees in clinic Patient is currently controlled on the following medications:   Amlodipine 5 mg - 1 tablet daily  Lisinopril 2.5 mg - 1 tablet daily  Adherence: < 5 day gap between refills, reports taking both daily at 7 AM   Plan: Continue current medications  Hyperlipidemia   Lipid Panel      Component Value Date/Time   CHOL 152 06/08/2018 0921   CHOL 225 (H) 12/20/2011 1056   CHOL 202 05/07/2007 0000   TRIG 91.0 06/08/2018 0921   TRIG 238 05/07/2007 0000   HDL 67.70 06/08/2018 0921   HDL 48 12/20/2011 1056   LDLCALC 66 06/08/2018 0921   LDLCALC 152 (H) 12/20/2011 1056   LDLDIRECT 71.0 11/25/2016 1031    CBC Latest Ref Rng & Units 01/28/2019 06/08/2018 11/29/2017  WBC 4.0 - 10.5 K/uL 8.4 8.7 7.8  Hemoglobin 12.0 - 15.0 g/dL 11.8(L) 12.5 12.8  Hematocrit 36 - 46 % 36.6 38.0 38.9  Platelets 150 - 400 K/uL 253.0 235.0 212.0   LDL goal < 70 Patient has failed these meds in past: none reported Patient is currently controlled  on the following medications:   Aspirin 81 mg - 1 tablet daily  Atorvastatin 20 mg - 1 tablet daily at 6 PM  We discussed: confirms adherence, < 5 day gap between refills   Plan: Continue current medications  COPD / Asthma / Tobacco   Last spirometry score: 2014, FEV1 59% Gold Grade: Gold 2 (FEV1 50-79%)  Eosinophil count:   Lab Results  Component Value Date/Time   EOSPCT 2.7 01/28/2019 01:23 PM  %                               Eos (Absolute):  Lab Results  Component Value Date/Time   EOSABS 0.2 01/28/2019 01:23 PM   Tobacco Status:  Social History   Tobacco Use  Smoking Status Current Some Day Smoker   Packs/day: 1.00   Years: 56.00   Pack years: 56.00   Types: Cigarettes   Start date: 04/25/1968  Smokeless Tobacco Never Used  Tobacco Comment   smokes 1 pack week currently   Patient has failed these meds in past: none reported Patient is currently controlled on the following medications:   Albuterol HFA 90 mcg - 2 puffs every 6 hours PRN wheezing  Flonase - 2 sprays in each nostril every day PRN  Using maintenance inhaler regularly? No - none prescribed Frequency of rescue inhaler use:  daily (reports taking at night before bed and as needed when leaving the house, especially in the heat), last refilled 09/22/19  Flonase  use: reports using Flonase for allergies PRN, not often  We discussed: reports albuterol inhaler works well, denies trying any maintenance inhalers, encouraged deep breathing prior to albuterol use, encouraged tobacco cessation   Tobacco use: 1 pack lasts 2-3 weeks, smokes primarily for anxiety/nerves   Plan: Continue current medications; Continue to encourage tobacco cessation.   Diabetes   Recent Relevant Labs: Lab Results  Component Value Date/Time   HGBA1C 7.3 (A) 06/05/2019 10:20 AM   HGBA1C 7.4 (A) 11/29/2018 11:30 AM   HGBA1C 7.2 (H) 06/08/2018 09:21 AM   HGBA1C 7.2 (H) 11/29/2017 08:24 AM   MICROALBUR 313.7 (H) 06/08/2018 09:21 AM   MICROALBUR 61.0 (H) 11/29/2017 08:24 AM    Checking BG: Daily  (reports her goals are fasting < 120, bedtime < 200) Recent FBG Readings: 117 today, usually < 120 Recent HS BG readings: 140s  A1c goal < 7.5% Patient has failed these meds in past: none reported Patient is currently controlled on the following medications:   Metformin 1000 mg - 1 tablet BID  Januvia 50 mg - 1 tablet daily  Last diabetic eye exam:  Lab Results  Component Value Date/Time   HMDIABEYEEXA Retinopathy (A) 06/24/2019 12:00 AM    Last diabetic foot exam: with PCP 06/05/19  We discussed: denies cost concerns, no copay  Plan: Continue current medications; Continue daily blood glucose monitoring.   GERD/Barrett's Esophagus   Patient has failed these meds in past: pantoprazole (diarrhea) Patient is currently controlled on the following medications:   Omeprazole 20 mg - 1 tablet daily   We discussed: reports previously on pantoprazole, GI changed to omeprazole due to diarrhea   Plan: Continue current medications; Remove pantoprazole from medication list.   Osteopenia   Vitamin D (11/29/18): 13  Patient has failed these meds in past: none reported Patient is currently on the following medications:   Vitamin D3 50,000 IU - 1 capsule weekly (since January 2021  on Sundays)  Vitamin D with calcium 600-400 mg-unit - 1 chew tablet daily (not taking)  We discussed: denies missed doses, recommend updating vitamin D   Plan: Continue current medications; Remove vitamin D/calcium from medication list.  Misc.   Patient has failed these meds in past: none  Patient is currently controlled on the following medications:   Oxybutynin 5 mg - 1 tablet BID PRN  We discussed: uses as needed if bladder is overactive, reports usually taking at least twice per week  Plan: Continue current medications   Medication Management  Pharmacy: [facility]   Adherence: tries to take medications at the same time (7 AM) every morning, evening doses around 5-6 PM, uses a pillbox if going out of town, otherwise, keeps everything on living room table, refills timely, able to state name of medications  Affordability: denies cost concerns  CCM Follow Up: 6 months, telephone   [name], PharmD Clinical Pharmacist [facility] at [facility] [phone]

## 2019-11-04 NOTE — Telephone Encounter (Signed)
Per written referral from PCP, requesting referral in Epic for Cheryl Bird to chronic care management pharmacy services for the following conditions:   Essential hypertension, benign  [I10]  COPD [J44.9]  [name], PharmD Clinical Pharmacist [facility] at [facility] [phone]

## 2019-11-04 NOTE — Patient Instructions (Addendum)
Dear Cheryl Bird,  It was a pleasure meeting you during our initial appointment on November 04, 2019. Below is a summary of the goals we discussed and components of chronic care management. Please contact me anytime with questions or concerns.   Visit Information  Goals Addressed            This Visit's Progress   . Pharmacy Care Plan       CARE PLAN ENTRY  Current Barriers:  . Chronic Disease Management support, education, and care coordination needs related to Hypertension, Diabetes, and COPD   Hypertension BP Readings from Last 3 Encounters:  06/05/19 120/60  01/28/19 138/70  12/03/18 130/60 .  Pharmacist Clinical Goal(s): o Over the next 6 months, patient will work with PharmD and providers to maintain BP goal <140/90 mmHg . Current regimen:   Amlodipine 5 mg - 1 tablet daily  Lisinopril 2.5 mg - 1 tablet daily . Interventions: o Reviewed clinic blood pressure readings and home monitoring  . Patient self care activities - Over the next 6 months, patient will: o Check blood pressure 7 days prior to appointments, document, and provide at future appointments o Ensure daily salt intake < 2300 mg/day  Diabetes Lab Results  Component Value Date/Time   HGBA1C 7.3 (A) 06/05/2019 10:20 AM   HGBA1C 7.4 (A) 11/29/2018 11:30 AM   HGBA1C 7.2 (H) 06/08/2018 09:21 AM   HGBA1C 7.2 (H) 11/29/2017 08:24 AM .  Pharmacist Clinical Goal(s): o Over the next 6 months, patient will work with PharmD and providers to maintain A1c goal  < 7.5%  . Current regimen:   Metformin 1000 mg - 1 tablet twice daily   Januvia 50 mg - 1 tablet daily . Interventions: o Reviewed home blood glucose log  . Patient self care activities - Over the next 6 months, patient will: o Check blood sugar once daily, document, and provide at future appointments o Contact provider with any episodes of hypoglycemia  COPD . Pharmacist Clinical Goal(s) o Over the next 6 months, patient will work with PharmD and  providers to reduce shortness of breath and hospitalizations . Current regimen:  o Albuterol HFA 90 mcg - 2 puffs every 6 hours as needed for wheezing . Interventions: o Discussed tobacco cessation o Discussed only using albuterol for shortness of breath, cough, wheezing, chest tightness . Patient self care activities - Over the next 6 months, patient will: o Continue albuterol inhaler for shortness of breath and wheezing  o Consider tobacco cessation to improve your shortness of breath and overall health  Initial goal documentation      Cheryl Bird was given information about Chronic Care Management services today including:  1. CCM service includes personalized support from designated clinical staff supervised by her physician, including individualized plan of care and coordination with other care providers 2. 24/7 contact phone numbers for assistance for urgent and routine care needs. 3. Standard insurance, coinsurance, copays and deductibles apply for chronic care management only during months in which we provide at least 20 minutes of these services. Most insurances cover these services at 100%, however patients may be responsible for any copay, coinsurance and/or deductible if applicable. This service may help you avoid the need for more expensive face-to-face services. 4. Only one practitioner may furnish and bill the service in a calendar month. 5. The patient may stop CCM services at any time (effective at the end of the month) by phone call to the office staff.  Patient agreed  to services and verbal consent obtained.   The patient verbalized understanding of instructions provided today and agreed to receive a mailed copy of patient instruction and/or educational materials. Telephone follow up appointment with pharmacy team member scheduled for: 05/06/20 at 8:30 AM (telephone visit)  [name], PharmD Clinical Pharmacist [facility] at [facility] [phone]   Coping with Quitting Smoking  Quitting smoking is a physical and mental challenge. You will face cravings, withdrawal symptoms, and temptation. Before quitting, work with your health care provider to make a plan that can help you cope. Preparation can help you quit and keep you from giving in. How can I cope with cravings? Cravings usually last for 5-10 minutes. If you get through it, the craving will pass. Consider taking the following actions to help you cope with cravings:  Keep your mouth busy: ? Chew sugar-free gum. ? Suck on hard candies or a straw. ? Brush your teeth.  Keep your hands and body busy: ? Immediately change to a different activity when you feel a craving. ? Squeeze or play with a ball. ? Do an activity or a hobby, like making bead jewelry, practicing needlepoint, or working with wood. ? Mix up your normal routine. ? Take a short exercise break. Go for a quick walk or run up and down stairs. ? Spend time in public places where smoking is not allowed.  Focus on doing something kind or helpful for someone else.  Call a friend or family member to talk during a craving.  Join a support group.  Call a quit line, such as 1-800-QUIT-NOW.  Talk with your health care provider about medicines that might help you cope with cravings and make quitting easier for you. How can I deal with withdrawal symptoms? Your body may experience negative effects as it tries to get used to not having nicotine in the system. These effects are called withdrawal symptoms. They may include:  Feeling hungrier than normal.  Trouble concentrating.  Irritability.  Trouble sleeping.  Feeling depressed.  Restlessness and agitation.  Craving a cigarette. To manage withdrawal symptoms:  Avoid places, people, and activities that trigger your cravings.  Remember why you want to quit.  Get plenty of sleep.  Avoid coffee and other caffeinated drinks. These may  worsen some of your symptoms. How can I handle social situations? Social situations can be difficult when you are quitting smoking, especially in the first few weeks. To manage this, you can:  Avoid parties, bars, and other social situations where people might be smoking.  Avoid alcohol.  Leave right away if you have the urge to smoke.  Explain to your family and friends that you are quitting smoking. Ask for understanding and support.  Plan activities with friends or family where smoking is not an option. What are some ways I can cope with stress? Wanting to smoke may cause stress, and stress can make you want to smoke. Find ways to manage your stress. Relaxation techniques can help. For example:  Breathe slowly and deeply, in through your nose and out through your mouth.  Listen to soothing, relaxing music.  Talk with a family member or friend about your stress.  Light a candle.  Soak in a bath or take a shower.  Think about a peaceful place. What are some ways I can prevent weight gain? Be aware that many people gain weight after they quit smoking. However, not everyone does. To keep from gaining weight, have a plan in  place before you quit and stick to the plan after you quit. Your plan should include:  Having healthy snacks. When you have a craving, it may help to: ? Eat plain popcorn, crunchy carrots, celery, or other cut vegetables. ? Chew sugar-free gum.  Changing how you eat: ? Eat small portion sizes at meals. ? Eat 4-6 small meals throughout the day instead of 1-2 large meals a day. ? Be mindful when you eat. Do not watch television or do other things that might distract you as you eat.  Exercising regularly: ? Make time to exercise each day. If you do not have time for a long workout, do short bouts of exercise for 5-10 minutes several times a day. ? Do some form of strengthening exercise, like weight lifting, and some form of aerobic exercise, like running or  swimming.  Drinking plenty of water or other low-calorie or no-calorie drinks. Drink 6-8 glasses of water daily, or as much as instructed by your health care provider. Summary  Quitting smoking is a physical and mental challenge. You will face cravings, withdrawal symptoms, and temptation to smoke again. Preparation can help you as you go through these challenges.  You can cope with cravings by keeping your mouth busy (such as by chewing gum), keeping your body and hands busy, and making calls to family, friends, or a helpline for people who want to quit smoking.  You can cope with withdrawal symptoms by avoiding places where people smoke, avoiding drinks with caffeine, and getting plenty of rest.  Ask your health care provider about the different ways to prevent weight gain, avoid stress, and handle social situations. This information is not intended to replace advice given to you by your health care provider. Make sure you discuss any questions you have with your health care provider. Document Revised: 03/24/2017 Document Reviewed: 04/08/2016 Elsevier Patient Education  2020 [employer].

## 2019-11-12 NOTE — Progress Notes (Signed)
I have collaborated with the care management provider regarding care management and care coordination activities outlined in this encounter and have reviewed this encounter including documentation in the note and care plan. I am certifying that I agree with the content of this note and encounter as supervising physician.  

## 2019-11-18 ENCOUNTER — Other Ambulatory Visit: Payer: Self-pay

## 2019-11-18 ENCOUNTER — Other Ambulatory Visit
Admission: RE | Admit: 2019-11-18 | Discharge: 2019-11-18 | Disposition: A | Discharge status: Home / Self Care | Payer: Medicare Other | Source: Ambulatory Visit | Attending: Internal Medicine | Admitting: Internal Medicine

## 2019-11-18 DIAGNOSIS — Z20822 Contact with and (suspected) exposure to covid-19: Secondary | ICD-10-CM | POA: Diagnosis not present

## 2019-11-18 DIAGNOSIS — Z01812 Encounter for preprocedural laboratory examination: Secondary | ICD-10-CM | POA: Diagnosis not present

## 2019-11-19 LAB — SARS CORONAVIRUS 2 (TAT 6-24 HRS): SARS Coronavirus 2: NEGATIVE

## 2019-11-20 ENCOUNTER — Other Ambulatory Visit: Payer: Self-pay

## 2019-11-20 ENCOUNTER — Ambulatory Visit
Admission: RE | Admit: 2019-11-20 | Discharge: 2019-11-20 | Disposition: A | Discharge status: Home / Self Care | Payer: Medicare Other | Attending: Internal Medicine | Admitting: Internal Medicine

## 2019-11-20 ENCOUNTER — Encounter
Admission: RE | Disposition: A | Discharge status: Home / Self Care | Payer: Self-pay | Source: Home / Self Care | Attending: Internal Medicine

## 2019-11-20 ENCOUNTER — Ambulatory Visit: Payer: Medicare Other | Admitting: Anesthesiology

## 2019-11-20 ENCOUNTER — Encounter: Payer: Self-pay | Admitting: Internal Medicine

## 2019-11-20 DIAGNOSIS — Z87891 Personal history of nicotine dependence: Secondary | ICD-10-CM | POA: Diagnosis not present

## 2019-11-20 DIAGNOSIS — K648 Other hemorrhoids: Secondary | ICD-10-CM | POA: Diagnosis not present

## 2019-11-20 DIAGNOSIS — J449 Chronic obstructive pulmonary disease, unspecified: Secondary | ICD-10-CM | POA: Diagnosis not present

## 2019-11-20 DIAGNOSIS — K640 First degree hemorrhoids: Secondary | ICD-10-CM | POA: Diagnosis not present

## 2019-11-20 DIAGNOSIS — Z7982 Long term (current) use of aspirin: Secondary | ICD-10-CM | POA: Insufficient documentation

## 2019-11-20 DIAGNOSIS — Z7984 Long term (current) use of oral hypoglycemic drugs: Secondary | ICD-10-CM | POA: Insufficient documentation

## 2019-11-20 DIAGNOSIS — M8580 Other specified disorders of bone density and structure, unspecified site: Secondary | ICD-10-CM | POA: Diagnosis not present

## 2019-11-20 DIAGNOSIS — Z1211 Encounter for screening for malignant neoplasm of colon: Secondary | ICD-10-CM | POA: Diagnosis not present

## 2019-11-20 DIAGNOSIS — E11319 Type 2 diabetes mellitus with unspecified diabetic retinopathy without macular edema: Secondary | ICD-10-CM | POA: Diagnosis not present

## 2019-11-20 DIAGNOSIS — I10 Essential (primary) hypertension: Secondary | ICD-10-CM | POA: Diagnosis not present

## 2019-11-20 DIAGNOSIS — K219 Gastro-esophageal reflux disease without esophagitis: Secondary | ICD-10-CM | POA: Diagnosis not present

## 2019-11-20 DIAGNOSIS — E785 Hyperlipidemia, unspecified: Secondary | ICD-10-CM | POA: Insufficient documentation

## 2019-11-20 DIAGNOSIS — Z86010 Personal history of colonic polyps: Secondary | ICD-10-CM | POA: Insufficient documentation

## 2019-11-20 DIAGNOSIS — Z79899 Other long term (current) drug therapy: Secondary | ICD-10-CM | POA: Diagnosis not present

## 2019-11-20 DIAGNOSIS — K5730 Diverticulosis of large intestine without perforation or abscess without bleeding: Secondary | ICD-10-CM | POA: Insufficient documentation

## 2019-11-20 DIAGNOSIS — I129 Hypertensive chronic kidney disease with stage 1 through stage 4 chronic kidney disease, or unspecified chronic kidney disease: Secondary | ICD-10-CM | POA: Diagnosis not present

## 2019-11-20 DIAGNOSIS — K5790 Diverticulosis of intestine, part unspecified, without perforation or abscess without bleeding: Secondary | ICD-10-CM | POA: Diagnosis not present

## 2019-11-20 HISTORY — PX: COLONOSCOPY WITH PROPOFOL: SHX5780

## 2019-11-20 LAB — GLUCOSE, CAPILLARY: Glucose-Capillary: 134 mg/dL — ABNORMAL HIGH (ref 70–99)

## 2019-11-20 SURGERY — COLONOSCOPY WITH PROPOFOL
Anesthesia: General

## 2019-11-20 MED ORDER — SODIUM CHLORIDE 0.9 % IV SOLN: INTRAVENOUS | Status: DC

## 2019-11-20 MED ORDER — GLYCOPYRROLATE 0.2 MG/ML IJ SOLN
INTRAMUSCULAR | Status: AC
  Filled 2019-11-20: qty 1

## 2019-11-20 MED ORDER — PROPOFOL 500 MG/50ML IV EMUL
INTRAVENOUS | Status: AC
  Filled 2019-11-20: qty 50

## 2019-11-20 MED ORDER — LIDOCAINE HCL (PF) 2 % IJ SOLN
INTRAMUSCULAR | Status: AC
  Filled 2019-11-20: qty 5

## 2019-11-20 MED ORDER — PROPOFOL 500 MG/50ML IV EMUL
INTRAVENOUS | Status: DC | PRN
  Administered 2019-11-20: 100 ug/kg/min via INTRAVENOUS

## 2019-11-20 NOTE — Op Note (Signed)
[facility] Patient Name: Cheryl Bird Procedure Date: 11/20/2019 2:27 PM MRN: [id number] Account #: [id number] Date of Birth: 11/27/1945 Admit Type: Outpatient Age: 74 Room: [facility] ENDO ROOM 3 Gender: Female Note Status: Finalized Procedure:             Colonoscopy Indications:           High risk colon cancer surveillance: Personal history                         of colonic polyps Providers:             Benay Pike. Breyon Sigg MD, MD Medicines:             Propofol per Anesthesia Complications:         No immediate complications. Procedure:             Pre-Anesthesia Assessment:                        - The risks and benefits of the procedure and the                         sedation options and risks were discussed with the                         patient. All questions were answered and informed                         consent was obtained.                        - Patient identification and proposed procedure were                         verified prior to the procedure by the nurse. The                         procedure was verified in the procedure room.                        - ASA Grade Assessment: III - A patient with severe                         systemic disease.                        - After reviewing the risks and benefits, the patient                         was deemed in satisfactory condition to undergo the                         procedure.                        After obtaining informed consent, the colonoscope was                         passed under direct vision. Throughout the procedure,  the patient's blood pressure, pulse, and oxygen                         saturations were monitored continuously. The                         Colonoscope was introduced through the anus and                         advanced to the the cecum, identified by appendiceal                         orifice and ileocecal valve. The  colonoscopy was                         performed without difficulty. The patient tolerated                         the procedure well. The quality of the bowel                         preparation was good. The ileocecal valve, appendiceal                         orifice, and rectum were photographed. Findings:      The perianal and digital rectal examinations were normal. Pertinent       negatives include normal sphincter tone and no palpable rectal lesions.      Many medium-mouthed diverticula were found in the sigmoid colon.      Non-bleeding internal hemorrhoids were found during retroflexion. The       hemorrhoids were Grade I (internal hemorrhoids that do not prolapse).      The exam was otherwise without abnormality. Impression:            - Diverticulosis in the sigmoid colon.                        - Non-bleeding internal hemorrhoids.                        - The examination was otherwise normal.                        - No specimens collected. Recommendation:        - Patient has a contact number available for                         emergencies. The signs and symptoms of potential                         delayed complications were discussed with the patient.                         Return to normal activities tomorrow. Written                         discharge instructions were provided to the patient.                        -  Resume previous diet.                        - Continue present medications.                        - No repeat colonoscopy due to age and the absence of                         colonic polyps.                        - You do NOT require further colon cancer screening                         measures (Annual stool testing (i.e. hemoccult, FIT,                         cologuard), sigmoidoscopy, colonoscopy or CT                         colonography). You should share this recommendation                         with your Primary Care provider.                         - Return to GI office PRN.                        - The findings and recommendations were discussed with                         the patient. Procedure Code(s):     --- Professional ---                        I7124, Colorectal cancer screening; colonoscopy on                         individual at high risk Diagnosis Code(s):     --- Professional ---                        K57.30, Diverticulosis of large intestine without                         perforation or abscess without bleeding                        K64.0, First degree hemorrhoids                        Z86.010, Personal history of colonic polyps CPT copyright 2019 [employer]. All rights reserved. The codes documented in this report are preliminary and upon coder review may  be revised to meet current compliance requirements. Efrain Sella MD, MD 11/20/2019 2:54:40 PM This report has been signed electronically. Number of Addenda: 0 Note Initiated On: 11/20/2019 2:27 PM Scope Withdrawal Time: 0 hours 6 minutes 7 seconds  Total Procedure Duration: 0 hours 13 minutes 15 seconds  Estimated Blood Loss:  Estimated blood loss: none.  [facility]

## 2019-11-20 NOTE — Anesthesia Preprocedure Evaluation (Signed)
Anesthesia Evaluation  Patient identified by MRN, date of birth, ID band Patient awake    Reviewed: Allergy & Precautions, H&P , NPO status , Patient's Chart, lab work & pertinent test results, reviewed documented beta blocker date and time   History of Anesthesia Complications Negative for: history of anesthetic complications  Airway Mallampati: II  TM Distance: >3 FB Neck ROM: full    Dental  (+) Edentulous Upper, Missing, Poor Dentition, Dental Advidsory Given   Pulmonary neg shortness of breath, COPD,  COPD inhaler, neg recent URI, Current Smoker and Patient abstained from smoking.,           Cardiovascular Exercise Tolerance: Good hypertension, (-) angina+ Peripheral Vascular Disease  (-) CAD, (-) Past MI, (-) Cardiac Stents and (-) CABG (-) dysrhythmias (-) Valvular Problems/Murmurs     Neuro/Psych neg Seizures negative neurological ROS  negative psych ROS   GI/Hepatic Neg liver ROS, GERD  ,  Endo/Other  diabetes  Renal/GU CRFRenal disease  negative genitourinary   Musculoskeletal   Abdominal   Peds  Hematology negative hematology ROS (+)   Anesthesia Other Findings Past Medical History: 09/2004: Adenomatous rectal polyp     Comment: Nicolasa Ducking) 01/2016: Background diabetic retinopathy ([place]) 02/2012: Barrett esophagus     Comment: on EGD biopsy Gustavo Lah) 02/12/2013: COPD, moderate ([place])     Comment: Spirometry - 01/2013: FEV1 = 59%, post alb               78%, ratio 0.6, mod severe obstruction, good               response to bronchodilator  No date: Diabetes type 2, controlled ([place])     Comment: DMSE @ [facility] 11/2011 09/2004: Diverticulosis     Comment: by colonoscopy 01/2013: Ex-smoker No date: GERD (gastroesophageal reflux disease) No date: History of cleft palate No date: HLD (hyperlipidemia) No date: HTN (hypertension)     Comment: dx per prior records 05/2015: Osteopenia     Comment: T -1.4 femur,  foreamr -0.7 No date: Right-sided sensorineural hearing loss     Comment: from infection as child, hsa been seen by Dr.               Pryor Ochoa   Reproductive/Obstetrics negative OB ROS                             Anesthesia Physical  Anesthesia Plan  ASA: III  Anesthesia Plan: General   Post-op Pain Management:    Induction: Intravenous  PONV Risk Score and Plan: 2 and Propofol infusion and TIVA  Airway Management Planned: Natural Airway and Nasal Cannula  Additional Equipment:   Intra-op Plan:   Post-operative Plan:   Informed Consent: I have reviewed the patients History and Physical, chart, labs and discussed the procedure including the risks, benefits and alternatives for the proposed anesthesia with the patient or authorized representative who has indicated his/her understanding and acceptance.     Dental Advisory Given  Plan Discussed with: Anesthesiologist, CRNA and Surgeon  Anesthesia Plan Comments:         Anesthesia Quick Evaluation

## 2019-11-20 NOTE — Transfer of Care (Signed)
Immediate Anesthesia Transfer of Care Note  Patient: Cheryl Bird  Procedure(s) Performed: COLONOSCOPY WITH PROPOFOL (N/A )  Patient Location: PACU  Anesthesia Type:General  Level of Consciousness: awake and sedated  Airway & Oxygen Therapy: Patient Spontanous Breathing and Patient connected to nasal cannula oxygen  Post-op Assessment: Report given to RN and Post -op Vital signs reviewed and stable  Post vital signs: Reviewed and stable  Last Vitals:  Vitals Value Taken Time  BP    Temp    Pulse    Resp    SpO2      Last Pain:  Vitals:   11/20/19 1330  TempSrc: Temporal         Complications: No complications documented.

## 2019-11-20 NOTE — Interval H&P Note (Signed)
History and Physical Interval Note:  11/20/2019 2:31 PM  Cheryl Bird  has presented today for surgery, with the diagnosis of P HX POLYPS.  The various methods of treatment have been discussed with the patient and family. After consideration of risks, benefits and other options for treatment, the patient has consented to  Procedure(s): COLONOSCOPY WITH PROPOFOL (N/A) as a surgical intervention.  The patient's history has been reviewed, patient examined, no change in status, stable for surgery.  I have reviewed the patient's chart and labs.  Questions were answered to the patient's satisfaction.     [place], [place]

## 2019-11-20 NOTE — Anesthesia Procedure Notes (Signed)
Performed by: Cook-Martin, Dru Primeau Pre-anesthesia Checklist: Patient identified, Emergency Drugs available, Suction available, Patient being monitored and Timeout performed Patient Re-evaluated:Patient Re-evaluated prior to induction Oxygen Delivery Method: Nasal cannula Preoxygenation: Pre-oxygenation with 100% oxygen Induction Type: IV induction Placement Confirmation: positive ETCO2 and CO2 detector       

## 2019-11-20 NOTE — H&P (Signed)
Outpatient short stay form Pre-procedure 11/20/2019 2:30 PM Dyshon Philbin K. Alice Reichert, M.D.  Primary Physician: Ria Bush, M.D.  Reason for visit:  Personal history of adenomatous colon polyps (2018)  History of present illness:                           Patient presents for colonoscopy for a personal hx of colon polyps. The patient denies abdominal pain, abnormal weight loss or rectal bleeding.      Current Facility-Administered Medications:  .  0.9 %  sodium chloride infusion, , Intravenous, Continuous, [place], Benay Pike, MD, Last Rate: 20 mL/hr at 11/20/19 1355, New Bag at 11/20/19 1355  Medications Prior to Admission  Medication Sig Dispense Refill Last Dose  . albuterol (VENTOLIN HFA) 108 (90 Base) MCG/ACT inhaler Inhale 2 puffs into the lungs every 6 (six) hours as needed for wheezing. 18 g 6 11/19/2019 at Unknown time  . amLODipine (NORVASC) 5 MG tablet Take 1 tablet (5 mg total) by mouth daily. 90 tablet 3 11/19/2019 at Unknown time  . aspirin EC 81 MG tablet Take 1 tablet (81 mg total) by mouth every other day.   Past Week at Unknown time  . atorvastatin (LIPITOR) 20 MG tablet Take 1 tablet (20 mg total) by mouth daily at 6 PM. 90 tablet 3 Past Week at Unknown time  . Calcium Carbonate-Vitamin D (CALCIUM 600/VITAMIN D) 600-400 MG-UNIT chew tablet Chew 1 tablet by mouth daily.   Past Week at Unknown time  . lisinopril (ZESTRIL) 2.5 MG tablet Take 1 tablet (2.5 mg total) by mouth daily. 90 tablet 3 11/19/2019 at Unknown time  . metFORMIN (GLUCOPHAGE) 1000 MG tablet Take 1 tablet (1,000 mg total) by mouth 2 (two) times daily with a meal. 180 tablet 3 Past Week at Unknown time  . omeprazole (PRILOSEC) 20 MG capsule Take by mouth.   11/19/2019 at Unknown time  . oxybutynin (DITROPAN) 5 MG tablet TAKE 1 TABLET (5 MG TOTAL) BY MOUTH 2 (TWO) TIMES DAILY AS NEEDED (INCONTINENCE). 180 tablet 1 Past Week at Unknown time  . sitaGLIPtin (JANUVIA) 50 MG tablet Take 1 tablet (50 mg total) by mouth  daily. 90 tablet 3 Past Week at Unknown time  . Cholecalciferol (VITAMIN D3) 1.25 MG (50000 UT) CAPS TAKE 1 CAPULE BY MOUTH ONE TIME PER WEEK 12 capsule 0   . fluticasone (FLONASE) 50 MCG/ACT nasal spray SPRAY 2 SPRAYS INTO EACH NOSTRIL EVERY DAY 48 mL 1   . pantoprazole (PROTONIX) 40 MG tablet Take 1 tablet (40 mg total) by mouth every other day. (Patient not taking: Reported on 11/04/2019) 45 tablet 3      Allergies  Allergen Reactions  . Aleve [Naproxen Sodium] Itching     Past Medical History:  Diagnosis Date  . Adenomatous rectal polyp 09/2004   Nicolasa Ducking)  . Background diabetic retinopathy ([place]) 01/2016  . Barrett esophagus 02/2012   on EGD biopsy Gustavo Lah)  . Carotid stenosis 06/04/2016   40-59% RICA stenosis. 1-61% LICA stenosis. Repeat yearly (06/2016)  . COPD, moderate ([place]) 02/12/2013   Spirometry - 01/2013: FEV1 = 59%, post alb 78%, ratio 0.6, mod severe obstruction, good response to bronchodilator   . Diabetes type 2, controlled ([place])    DMSE @ [facility] 11/2011  . Diverticulosis 09/2004   by colonoscopy  . Ex-smoker 01/2013  . GERD (gastroesophageal reflux disease)   . History of cleft palate   . HLD (hyperlipidemia)   . HTN (hypertension)  dx per prior records  . Osteopenia 05/2015   T -1.4 femur, foreamr -0.7  . Right-sided sensorineural hearing loss    from infection as child, hsa been seen by Dr. Pryor Ochoa  . Tubular adenoma of colon     Review of systems:  Otherwise negative.    Physical Exam  Gen: Alert, oriented. Appears stated age.  HEENT: [place]/AT. PERRLA. Lungs: CTA, no wheezes. CV: RR nl S1, S2. Abd: soft, benign, no masses. BS+ Ext: No edema. Pulses 2+    Planned procedures: Proceed with colonoscopy. The patient understands the nature of the planned procedure, indications, risks, alternatives and potential complications including but not limited to bleeding, infection, perforation, damage to internal organs and possible oversedation/side effects from  anesthesia. The patient agrees and gives consent to proceed.  Please refer to procedure notes for findings, recommendations and patient disposition/instructions.     Mihir Flanigan K. Alice Reichert, M.D. Gastroenterology 11/20/2019  2:30 PM

## 2019-11-21 ENCOUNTER — Encounter: Payer: Self-pay | Admitting: Internal Medicine

## 2019-11-21 NOTE — Anesthesia Postprocedure Evaluation (Signed)
Anesthesia Post Note  Patient: Cheryl Bird  Procedure(s) Performed: COLONOSCOPY WITH PROPOFOL (N/A )  Patient location during evaluation: Endoscopy Anesthesia Type: General Level of consciousness: awake and alert Pain management: pain level controlled Vital Signs Assessment: post-procedure vital signs reviewed and stable Respiratory status: spontaneous breathing, nonlabored ventilation, respiratory function stable and patient connected to nasal cannula oxygen Cardiovascular status: blood pressure returned to baseline and stable Postop Assessment: no apparent nausea or vomiting Anesthetic complications: no   No complications documented.   Last Vitals:  Vitals:   11/20/19 1457 11/20/19 1507  BP: (!) 154/49 (!) 142/57  Pulse:    Resp:    Temp: (!) 36.2 C   SpO2:      Last Pain:  Vitals:   11/21/19 0757  TempSrc:   PainSc: 0-No pain                 Martha Clan

## 2019-11-22 ENCOUNTER — Other Ambulatory Visit: Payer: Self-pay | Admitting: Family Medicine

## 2019-11-23 ENCOUNTER — Other Ambulatory Visit: Payer: Self-pay | Admitting: Family Medicine

## 2019-11-24 ENCOUNTER — Other Ambulatory Visit: Payer: Self-pay | Admitting: Family Medicine

## 2019-11-24 DIAGNOSIS — E1122 Type 2 diabetes mellitus with diabetic chronic kidney disease: Secondary | ICD-10-CM

## 2019-11-24 DIAGNOSIS — E785 Hyperlipidemia, unspecified: Secondary | ICD-10-CM

## 2019-11-24 DIAGNOSIS — E559 Vitamin D deficiency, unspecified: Secondary | ICD-10-CM

## 2019-11-24 DIAGNOSIS — R7989 Other specified abnormal findings of blood chemistry: Secondary | ICD-10-CM

## 2019-11-24 DIAGNOSIS — E118 Type 2 diabetes mellitus with unspecified complications: Secondary | ICD-10-CM

## 2019-11-24 DIAGNOSIS — K7460 Unspecified cirrhosis of liver: Secondary | ICD-10-CM

## 2019-11-25 NOTE — Telephone Encounter (Signed)
Electronic refill request. D3 Last office visit:   06/05/2019 Last Filled:    12 capsule 0 09/06/2019  Looks like patient may be getting labs done already.

## 2019-11-26 ENCOUNTER — Other Ambulatory Visit: Payer: Self-pay

## 2019-11-26 ENCOUNTER — Other Ambulatory Visit (INDEPENDENT_AMBULATORY_CARE_PROVIDER_SITE_OTHER): Payer: Medicare Other

## 2019-11-26 ENCOUNTER — Ambulatory Visit (INDEPENDENT_AMBULATORY_CARE_PROVIDER_SITE_OTHER): Payer: Medicare Other

## 2019-11-26 DIAGNOSIS — E118 Type 2 diabetes mellitus with unspecified complications: Secondary | ICD-10-CM

## 2019-11-26 DIAGNOSIS — K7460 Unspecified cirrhosis of liver: Secondary | ICD-10-CM

## 2019-11-26 DIAGNOSIS — R7989 Other specified abnormal findings of blood chemistry: Secondary | ICD-10-CM | POA: Diagnosis not present

## 2019-11-26 DIAGNOSIS — E1169 Type 2 diabetes mellitus with other specified complication: Secondary | ICD-10-CM

## 2019-11-26 DIAGNOSIS — E559 Vitamin D deficiency, unspecified: Secondary | ICD-10-CM | POA: Diagnosis not present

## 2019-11-26 DIAGNOSIS — E785 Hyperlipidemia, unspecified: Secondary | ICD-10-CM | POA: Diagnosis not present

## 2019-11-26 DIAGNOSIS — Z0000 Encounter for general adult medical examination without abnormal findings: Secondary | ICD-10-CM | POA: Diagnosis not present

## 2019-11-26 DIAGNOSIS — N183 Chronic kidney disease, stage 3 unspecified: Secondary | ICD-10-CM | POA: Diagnosis not present

## 2019-11-26 DIAGNOSIS — E1122 Type 2 diabetes mellitus with diabetic chronic kidney disease: Secondary | ICD-10-CM | POA: Diagnosis not present

## 2019-11-26 LAB — COMPREHENSIVE METABOLIC PANEL
ALT: 18 U/L (ref 0–35)
AST: 21 U/L (ref 0–37)
Albumin: 3.7 g/dL (ref 3.5–5.2)
Alkaline Phosphatase: 79 U/L (ref 39–117)
BUN: 20 mg/dL (ref 6–23)
CO2: 23 mEq/L (ref 19–32)
Calcium: 9.5 mg/dL (ref 8.4–10.5)
Chloride: 106 mEq/L (ref 96–112)
Creatinine, Ser: 1.52 mg/dL — ABNORMAL HIGH (ref 0.40–1.20)
GFR: 33.41 mL/min — ABNORMAL LOW (ref >60.00)
Glucose, Bld: 132 mg/dL — ABNORMAL HIGH (ref 70–99)
Potassium: 4.7 mEq/L (ref 3.5–5.1)
Sodium: 138 mEq/L (ref 135–145)
Total Bilirubin: 0.3 mg/dL (ref 0.2–1.2)
Total Protein: 6.4 g/dL (ref 6.0–8.3)

## 2019-11-26 LAB — MICROALBUMIN / CREATININE URINE RATIO
Creatinine,U: 134.6 mg/dL
Microalb Creat Ratio: 67.4 mg/g — ABNORMAL HIGH (ref 0.0–30.0)
Microalb, Ur: 90.7 mg/dL — ABNORMAL HIGH (ref 0.0–1.9)

## 2019-11-26 LAB — CBC WITH DIFFERENTIAL/PLATELET
Basophils Absolute: 0.1 10*3/uL (ref 0.0–0.1)
Basophils Relative: 1 % (ref 0.0–3.0)
Eosinophils Absolute: 0.3 10*3/uL (ref 0.0–0.7)
Eosinophils Relative: 4.4 % (ref 0.0–5.0)
HCT: 35.6 % — ABNORMAL LOW (ref 36.0–46.0)
Hemoglobin: 11.4 g/dL — ABNORMAL LOW (ref 12.0–15.0)
Lymphocytes Relative: 26.1 % (ref 12.0–46.0)
Lymphs Abs: 2 10*3/uL (ref 0.7–4.0)
MCHC: 32.1 g/dL (ref 30.0–36.0)
MCV: 88.3 fl (ref 78.0–100.0)
Monocytes Absolute: 0.7 10*3/uL (ref 0.1–1.0)
Monocytes Relative: 9.5 % (ref 3.0–12.0)
Neutro Abs: 4.6 10*3/uL (ref 1.4–7.7)
Neutrophils Relative %: 59 % (ref 43.0–77.0)
Platelets: 264 10*3/uL (ref 150.0–400.0)
RBC: 4.04 Mil/uL (ref 3.87–5.11)
RDW: 15.3 % (ref 11.5–15.5)
WBC: 7.8 10*3/uL (ref 4.0–10.5)

## 2019-11-26 LAB — LIPID PANEL
Cholesterol: 150 mg/dL (ref 0–200)
HDL: 71.6 mg/dL (ref >39.00)
LDL Cholesterol: 61 mg/dL (ref 0–99)
NonHDL: 78.58
Total CHOL/HDL Ratio: 2
Triglycerides: 88 mg/dL (ref 0.0–149.0)
VLDL: 17.6 mg/dL (ref 0.0–40.0)

## 2019-11-26 LAB — PROTIME-INR
INR: 0.9 ratio (ref 0.8–1.0)
Prothrombin Time: 10.1 s (ref 9.6–13.1)

## 2019-11-26 LAB — T4, FREE: Free T4: 0.74 ng/dL (ref 0.60–1.60)

## 2019-11-26 LAB — HEMOGLOBIN A1C: Hgb A1c MFr Bld: 7.3 % — ABNORMAL HIGH (ref 4.6–6.5)

## 2019-11-26 LAB — VITAMIN D 25 HYDROXY (VIT D DEFICIENCY, FRACTURES): VITD: 44.87 ng/mL (ref 30.00–100.00)

## 2019-11-26 LAB — TSH: TSH: 4.07 u[IU]/mL (ref 0.35–4.50)

## 2019-11-26 NOTE — Progress Notes (Signed)
Subjective:   Cheryl Bird is a 74 y.o. female who presents for Medicare Annual (Subsequent) preventive examination.  Review of Systems: N/A      I connected with the patient today by telephone and verified that I am speaking with the correct person using two identifiers. Location patient: home Location nurse: work Persons participating in the virtual visit: patient, Marine scientist.   I discussed the limitations, risks, security and privacy concerns of performing an evaluation and management service by telephone and the availability of in person appointments. I also discussed with the patient that there may be a patient responsible charge related to this service. The patient expressed understanding and verbally consented to this telephonic visit.    Interactive audio and video telecommunications were attempted between this nurse and patient, however failed, due to patient having technical difficulties OR patient did not have access to video capability.  We continued and completed visit with audio only.     Cardiac Risk Factors include: advanced age (>19men, >22 women);diabetes mellitus;hypertension;smoking/ tobacco exposure     Objective:    Today's Vitals   11/26/19 1200  PainSc: 0-No pain   There is no height or weight on file to calculate BMI.  Advanced Directives 11/26/2019 11/20/2019 06/08/2018 03/30/2018 08/25/2017 05/31/2017 05/30/2016  Does Patient Have a Medical Advance Directive? Yes Yes No Yes No No No  Type of Paramedic of [place];Living will [place];Living will - - - - -  Copy of [place] in Chart? No - copy requested No - copy requested - - - - -  Would patient like information on creating a medical advance directive? - - No - Patient declined - No - Patient declined No - Patient declined -    Current Medications (verified) Outpatient Encounter Medications as of 11/26/2019  Medication Sig  . albuterol (VENTOLIN  HFA) 108 (90 Base) MCG/ACT inhaler Inhale 2 puffs into the lungs every 6 (six) hours as needed for wheezing.  Marland Kitchen amLODipine (NORVASC) 5 MG tablet Take 1 tablet (5 mg total) by mouth daily.  Marland Kitchen aspirin EC 81 MG tablet Take 1 tablet (81 mg total) by mouth every other day.  Marland Kitchen atorvastatin (LIPITOR) 20 MG tablet TAKE 1 TABLET (20 MG TOTAL) BY MOUTH DAILY AT 6 PM.  . Calcium Carbonate-Vitamin D (CALCIUM 600/VITAMIN D) 600-400 MG-UNIT chew tablet Chew 1 tablet by mouth daily.  . Cholecalciferol (VITAMIN D3) 1.25 MG (50000 UT) CAPS TAKE 1 CAPULE BY MOUTH ONE TIME PER WEEK  . fluticasone (FLONASE) 50 MCG/ACT nasal spray SPRAY 2 SPRAYS INTO EACH NOSTRIL EVERY DAY  . lisinopril (ZESTRIL) 2.5 MG tablet TAKE 1 TABLET BY MOUTH EVERY DAY  . metFORMIN (GLUCOPHAGE) 1000 MG tablet Take 1 tablet (1,000 mg total) by mouth 2 (two) times daily with a meal.  . omeprazole (PRILOSEC) 20 MG capsule Take by mouth.  . oxybutynin (DITROPAN) 5 MG tablet TAKE 1 TABLET (5 MG TOTAL) BY MOUTH 2 (TWO) TIMES DAILY AS NEEDED (INCONTINENCE).  Marland Kitchen pantoprazole (PROTONIX) 40 MG tablet Take 1 tablet (40 mg total) by mouth every other day.  . sitaGLIPtin (JANUVIA) 50 MG tablet Take 1 tablet (50 mg total) by mouth daily.   No facility-administered encounter medications on file as of 11/26/2019.    Allergies (verified) Aleve [naproxen sodium]   History: Past Medical History:  Diagnosis Date  . Adenomatous rectal polyp 09/2004   Nicolasa Ducking)  . Background diabetic retinopathy ([place]) 01/2016  . Barrett esophagus 02/2012  on EGD biopsy Gustavo Lah)  . Carotid stenosis 06/04/2016   40-59% RICA stenosis. 6-94% LICA stenosis. Repeat yearly (06/2016)  . COPD, moderate ([place]) 02/12/2013   Spirometry - 01/2013: FEV1 = 59%, post alb 78%, ratio 0.6, mod severe obstruction, good response to bronchodilator   . Diabetes type 2, controlled ([place])    DMSE @ [facility] 11/2011  . Diverticulosis 09/2004   by colonoscopy  . Ex-smoker 01/2013  . GERD  (gastroesophageal reflux disease)   . History of cleft palate   . HLD (hyperlipidemia)   . HTN (hypertension)    dx per prior records  . Osteopenia 05/2015   T -1.4 femur, foreamr -0.7  . Right-sided sensorineural hearing loss    from infection as child, hsa been seen by Dr. Pryor Ochoa  . Tubular adenoma of colon    Past Surgical History:  Procedure Laterality Date  . BLADDER SUSPENSION    . CATARACT EXTRACTION  2013   Right  . COLONOSCOPY  10/08/2004   rectal polyp (tubular adenoma), diverticulosis - Dr. Nicolasa Ducking  . COLONOSCOPY  02/2012   multiple polyps - tubular and sessile adenomas, diverticulosis, decreased rectal tone ([place]) rec rpt 3 yrs  . COLONOSCOPY  2017   stool present, to reschedule Gustavo Lah)  . COLONOSCOPY WITH PROPOFOL N/A 12/21/2015   Procedure: COLONOSCOPY WITH PROPOFOL;  Surgeon: Lollie Sails, MD;  Location: [facility] ENDOSCOPY;  Service: Endoscopy;  Laterality: N/A;  . COLONOSCOPY WITH PROPOFOL N/A 05/30/2016   TAx3, rpt 3 yrs Lollie Sails, MD)  . COLONOSCOPY WITH PROPOFOL N/A 11/20/2019   Procedure: COLONOSCOPY WITH PROPOFOL;  Surgeon: Toledo, Benay Pike, MD;  Location: [facility] ENDOSCOPY;  Service: Gastroenterology;  Laterality: N/A;  . ESOPHAGOGASTRODUODENOSCOPY  02/2012   irregular z line (+barretts), normal stomach and duodenum, rpt EGD 1 yr  . ESOPHAGOGASTRODUODENOSCOPY  05/2013   irregular z line (+barretts), HH, reflux gastroesophagitis, rpt 2 yrs  . ESOPHAGOGASTRODUODENOSCOPY  11/2015   chronic gastritis, reflux esophagitis, + barrett's, + H pylori, rpt 2 yrs ([place])  . ESOPHAGOGASTRODUODENOSCOPY (EGD) WITH PROPOFOL N/A 12/21/2015   Procedure: ESOPHAGOGASTRODUODENOSCOPY (EGD) WITH PROPOFOL;  Surgeon: Lollie Sails, MD;  Location[facility] ENDOSCOPY;  Service: Endoscopy;  Laterality: N/A;  . ESOPHAGOGASTRODUODENOSCOPY (EGD) WITH PROPOFOL N/A 03/30/2018   gastritis, reflux gastroesophagitis, +barrett's, H pylori neg, rpt 3 yrs Gustavo Lah, Billie Ruddy, MD)  . EYE  SURGERY    . TONSILLECTOMY AND ADENOIDECTOMY    . TUBAL LIGATION     Family History  Problem Relation Age of Onset  . Diabetes Maternal Aunt   . Alcohol abuse Mother   . Heart disease Mother        enlarged heart  . Kidney failure Mother   . Hypertension Mother   . Coronary artery disease Father 66  . Coronary artery disease Brother 59  . Cancer Sister        kidney   . Stroke Neg Hx   . Breast cancer Neg Hx    Social History   Socioeconomic History  . Marital status: Single    Spouse name: Not on file  . Number of children: Not on file  . Years of education: Not on file  . Highest education level: Not on file  Occupational History  . Not on file  Tobacco Use  . Smoking status: Current Some Day Smoker    Packs/day: 0.25    Years: 56.00    Pack years: 14.00    Types: Cigarettes    Start date: 04/25/1968  .  Smokeless tobacco: Never Used  . Tobacco comment: smokes 1 pack week currently  Vaping Use  . Vaping Use: Never used  Substance and Sexual Activity  . Alcohol use: No  . Drug use: No  . Sexual activity: Never  Other Topics Concern  . Not on file  Social History Narrative   Caffeine: 2 cups coffee/day   [place] up in [place]   Occupation: retired   Edu: HS   Activity: walks daily 37min   Diet: good water, fruits/vegetables daily      Advanced directives: pt aware of this. Would want HCPOA to be daughter. Son in Sports coach is Therapist, sports of finances. Does want to stay on life support if reversible, otherwise doesn't want prolonged life support. Has spoken with daughter/son in law about this. Has living will/advanced directive at home but it's not notarized.   Social Determinants of Health   Financial Resource Strain: Low Risk   . Difficulty of Paying Living Expenses: Not hard at all  Food Insecurity: No Food Insecurity  . Worried About Charity fundraiser in the Last Year: Never true  . Ran Out of Food in the Last Year: Never true  Transportation Needs: No  Transportation Needs  . Lack of Transportation (Medical): No  . Lack of Transportation (Non-Medical): No  Physical Activity: Sufficiently Active  . Days of Exercise per Week: 7 days  . Minutes of Exercise per Session: 30 min  Stress: No Stress Concern Present  . Feeling of Stress : Not at all  Social Connections:   . Frequency of Communication with Friends and Family:   . Frequency of Social Gatherings with Friends and Family:   . Attends Religious Services:   . Active Member of Clubs or Organizations:   . Attends Archivist Meetings:   Marland Kitchen Marital Status:     Tobacco Counseling Ready to quit: Not Answered Counseling given: Not Answered Comment: smokes 1 pack week currently   Clinical Intake:  Pre-visit preparation completed: Yes  Pain : No/denies pain Pain Score: 0-No pain     Nutritional Risks: None Diabetes: Yes CBG done?: No Did pt. bring in CBG monitor from home?: No  How often do you need to have someone help you when you read instructions, pamphlets, or other written materials from your doctor or pharmacy?: 1 - Never What is the last grade level you completed in school?: GED  Diabetic: Yes Nutrition Risk Assessment:  Has the patient had any N/V/D within the last 2 months?  No  Does the patient have any non-healing wounds?  No  Has the patient had any unintentional weight loss or weight gain?  No   Diabetes:  Is the patient diabetic?  Yes  If diabetic, was a CBG obtained today?  No  Did the patient bring in their glucometer from home?  No  How often do you monitor your CBG's? Once daily.   Financial Strains and Diabetes Management:  Are you having any financial strains with the device, your supplies or your medication? No .  Does the patient want to be seen by Chronic Care Management for management of their diabetes?  No  Would the patient like to be referred to a Nutritionist or for Diabetic Management?  No   Diabetic Exams:  Diabetic Eye  Exam: Completed 06/24/2019 Diabetic Foot Exam: Completed 06/05/2019   Interpreter Needed?: No  Information entered by :: CJohnson, LPN   Activities of Daily Living In your present state of health, do you  have any difficulty performing the following activities: 11/26/2019  Hearing? Y  Comment some hearing loss noted  Vision? N  Difficulty concentrating or making decisions? N  Walking or climbing stairs? N  Dressing or bathing? N  Doing errands, shopping? N  Preparing Food and eating ? N  Using the Toilet? N  In the past six months, have you accidently leaked urine? N  Do you have problems with loss of bowel control? N  Managing your Medications? N  Managing your Finances? N  Housekeeping or managing your Housekeeping? N  Some recent data might be hidden    Patient Care Team: Ria Bush, MD as PCP - General (Family Medicine) Dingeldein, Remo Lipps, MD as Consulting Physician (Ophthalmology) Lollie Sails, MD (Inactive) as Consulting Physician (Gastroenterology) Ok Edwards, NP as Nurse Practitioner (Gastroenterology) [name], [facility] as Pharmacist (Pharmacist)  Indicate any recent Medical Services you may have received from other than [facility] providers in the past year (date may be approximate).     Assessment:   This is a routine wellness examination for Tamicka.  Hearing/Vision screen  Hearing Screening   125Hz  250Hz  500Hz  1000Hz  2000Hz  3000Hz  4000Hz  6000Hz  8000Hz   Right ear:           Left ear:           Vision Screening Comments: Patient gets annual eye exams   Dietary issues and exercise activities discussed: Current Exercise Habits: Home exercise routine, Type of exercise: walking, Time (Minutes): 30, Frequency (Times/Week): 7, Weekly Exercise (Minutes/Week): 210, Intensity: Moderate, Exercise limited by: None identified  Goals    . Patient Stated     Started 06/08/18, I will continue to take medications as prescribed.     . Patient Stated       11/26/2019, I will continue to walk daily for about 30 minutes when the weather permits.     . Pharmacy Care Plan     CARE PLAN ENTRY  Current Barriers:  . Chronic Disease Management support, education, and care coordination needs related to Hypertension, Diabetes, and COPD   Hypertension BP Readings from Last 3 Encounters:  06/05/19 120/60  01/28/19 138/70  12/03/18 130/60 .  Pharmacist Clinical Goal(s): o Over the next 6 months, patient will work with PharmD and providers to maintain BP goal <140/90 mmHg . Current regimen:   Amlodipine 5 mg - 1 tablet daily  Lisinopril 2.5 mg - 1 tablet daily . Interventions: o Reviewed clinic blood pressure readings and home monitoring  . Patient self care activities - Over the next 6 months, patient will: o Check blood pressure 7 days prior to appointments, document, and provide at future appointments o Ensure daily salt intake < 2300 mg/day  Diabetes Lab Results  Component Value Date/Time   HGBA1C 7.3 (A) 06/05/2019 10:20 AM   HGBA1C 7.4 (A) 11/29/2018 11:30 AM   HGBA1C 7.2 (H) 06/08/2018 09:21 AM   HGBA1C 7.2 (H) 11/29/2017 08:24 AM .  Pharmacist Clinical Goal(s): o Over the next 6 months, patient will work with PharmD and providers to maintain A1c goal  < 7.5%  . Current regimen:   Metformin 1000 mg - 1 tablet twice daily   Januvia 50 mg - 1 tablet daily . Interventions: o Reviewed home blood glucose log  . Patient self care activities - Over the next 6 months, patient will: o Check blood sugar once daily, document, and provide at future appointments o Contact provider with any episodes of hypoglycemia  COPD . Pharmacist Clinical  Goal(s) o Over the next 6 months, patient will work with PharmD and providers to reduce shortness of breath and hospitalizations . Current regimen:  o Albuterol HFA 90 mcg - 2 puffs every 6 hours as needed for wheezing . Interventions: o Discussed tobacco cessation o Discussed only using  albuterol for shortness of breath, cough, wheezing, chest tightness . Patient self care activities - Over the next 6 months, patient will: o Continue albuterol inhaler for shortness of breath and wheezing  o Consider tobacco cessation to improve your shortness of breath and overall health  Initial goal documentation      Depression Screen PHQ 2/9 Scores 11/26/2019 06/08/2018 05/31/2017 05/27/2016 06/02/2015 05/28/2014 05/13/2013  PHQ - 2 Score 0 0 0 0 0 0 0  PHQ- 9 Score 0 0 0 - - - -    Fall Risk Fall Risk  11/26/2019 06/08/2018 05/31/2017 05/27/2016 06/02/2015  Falls in the past year? 0 0 Yes No No  Comment - - 2 falls during inclement weather; 1 fall due to hole in yard; denies injury with any fall - -  Number falls in past yr: 0 - 2 or more - -  Injury with Fall? 0 - No - -  Risk for fall due to : Medication side effect - - - -  Follow up Falls evaluation completed;Falls prevention discussed - - - -    Any stairs in or around the home? Yes  If so, are there any without handrails? No  Home free of loose throw rugs in walkways, pet beds, electrical cords, etc? Yes  Adequate lighting in your home to reduce risk of falls? Yes   ASSISTIVE DEVICES UTILIZED TO PREVENT FALLS:  Life alert? No  Use of a cane, walker or w/c? No  Grab bars in the bathroom? No  Shower chair or bench in shower? No  Elevated toilet seat or a handicapped toilet? No   TIMED UP AND GO:  Was the test performed? N/A, telephonic visit .    Cognitive Function: MMSE - Mini Mental State Exam 11/26/2019 06/08/2018 05/31/2017 05/27/2016  Orientation to time 5 5 5 5   Orientation to Place 5 5 5 5   Registration 3 3 3 3   Attention/ Calculation 5 0 0 0  Recall 3 3 3 3   Language- name 2 objects - 0 0 0  Language- repeat 1 1 1 1   Language- follow 3 step command - 3 3 3   Language- read & follow direction - 0 0 0  Write a sentence - 0 0 0  Copy design - 0 0 0  Total score - 20 20 20   Mini Cog  Mini-Cog screen was completed. Maximum  score is 22. A value of 0 denotes this part of the MMSE was not completed or the patient failed this part of the Mini-Cog screening.       Immunizations Immunization History  Administered Date(s) Administered  . Fluad Quad(high Dose 65+) 01/28/2019  . Influenza Split 03/26/2012  . Influenza,inj,Quad PF,6+ Mos 01/04/2013, 01/09/2014, 06/02/2015, 05/27/2016, 05/31/2017, 03/06/2018  . Pneumococcal Conjugate-13 01/09/2014  . Pneumococcal Polysaccharide-23 12/23/2011  . Td 12/23/2011    TDAP status: Up to date Flu Vaccine status: due, avaliable in the office at the end of August Pneumococcal vaccine status: Up to date Covid-19 vaccine status: Completed vaccines will bring vaccine card to physical so we can document in chart  Qualifies for Shingles Vaccine? Yes   Zostavax completed No   Shingrix Completed?: No.  Education has been provided regarding the importance of this vaccine. Patient has been advised to call insurance company to determine out of pocket expense if they have not yet received this vaccine. Advised may also receive vaccine at local pharmacy or Health Dept. Verbalized acceptance and understanding.  Screening Tests Health Maintenance  Topic Date Due  . DTAP VACCINES (1) 11/03/1945  . COVID-19 Vaccine (1) Never done  . INFLUENZA VACCINE  11/24/2019  . HEMOGLOBIN A1C  12/03/2019  . FOOT EXAM  06/04/2020  . MAMMOGRAM  06/19/2020  . OPHTHALMOLOGY EXAM  06/23/2020  . DTaP/Tdap/Td (2 - Tdap) 12/22/2021  . TETANUS/TDAP  12/22/2021  . COLONOSCOPY  11/20/2022  . DEXA SCAN  Completed  . Hepatitis C Screening  Completed  . PNA vac Low Risk Adult  Completed    Health Maintenance  Health Maintenance Due  Topic Date Due  . DTAP VACCINES (1) 11/03/1945  . COVID-19 Vaccine (1) Never done  . INFLUENZA VACCINE  11/24/2019    Colorectal cancer screening: Completed 11/20/2019. Repeat every 3 years Mammogram status: Completed 06/20/2019. Repeat every year Bone Density  status:due, will have provider order this and discuss with patient at physical   Lung Cancer Screening: (Low Dose CT Chest recommended if Age 23-80 years, 30 pack-year currently smoking OR have quit w/in 15years.) does not qualify.    Additional Screening:  Hepatitis C Screening: does qualify; Completed 01/28/2019  Vision Screening: Recommended annual ophthalmology exams for early detection of glaucoma and other disorders of the eye. Is the patient up to date with their annual eye exam?  Yes  Who is the provider or what is the name of the office in which the patient attends annual eye exams? [facility] If pt is not established with a provider, would they like to be referred to a provider to establish care? No .   Dental Screening: Recommended annual dental exams for proper oral hygiene  Community Resource Referral / Chronic Care Management: CRR required this visit?  No   CCM required this visit?  No      Plan:     I have personally reviewed and noted the following in the patient's chart:   . Medical and social history . Use of alcohol, tobacco or illicit drugs  . Current medications and supplements . Functional ability and status . Nutritional status . Physical activity . Advanced directives . List of other physicians . Hospitalizations, surgeries, and ER visits in previous 12 months . Vitals . Screenings to include cognitive, depression, and falls . Referrals and appointments  In addition, I have reviewed and discussed with patient certain preventive protocols, quality metrics, and best practice recommendations. A written personalized care plan for preventive services as well as general preventive health recommendations were provided to patient.   Due to this being a telephonic visit, the after visit summary with patients personalized plan was offered to patient via mail or my-chart. Patient preferred to pick up at office at next visit.    Andrez Grime,  LPN   [id number]/[id number]   .

## 2019-11-26 NOTE — Addendum Note (Signed)
Addended by: Ellamae Sia on: 11/26/2019 10:31 AM   Modules accepted: Orders

## 2019-11-26 NOTE — Progress Notes (Signed)
PCP notes:  Health Maintenance: Flu- due Dexa- due, please order. Patient agrees to have this done Covid- Patient will bring card to physical so we can document dates in her chart   Abnormal Screenings: none   Patient concerns: Patient is concerned about her hearing. She states that her hearing loss is worsening.   Nurse concerns: none   Next PCP appt.: 12/04/2019 @ 11:30 am

## 2019-11-26 NOTE — Patient Instructions (Signed)
[name] , Thank you for taking time to come for your Medicare Wellness Visit. I appreciate your ongoing commitment to your health goals. Please review the following plan we discussed and let me know if I can assist you in the future.   Screening recommendations/referrals: Colonoscopy: Up to date, completed 11/20/2019, due 10/2022 Mammogram: Up to date, completed 06/20/2019, due 05/2020 Bone Density: due, will have provider order this  Recommended yearly ophthalmology/optometry visit for glaucoma screening and checkup Recommended yearly dental visit for hygiene and checkup  Vaccinations: Influenza vaccine: due, will be available at the end of the month Pneumococcal vaccine: Completed series Tdap vaccine: Up to date, completed 12/23/2011, due 11/2021 Shingles vaccine: due, check with your insurance regarding coverage   Covid-19:Completed series  Advanced directives: Please bring a copy of your POA (Power of [place]) and/or Living Will to your next appointment.   Conditions/risks identified: diabetes, hypertension  Next appointment: Follow up in one year for your annual wellness visit    Preventive Care 22 Years and Older, Female Preventive care refers to lifestyle choices and visits with your health care provider that can promote health and wellness. What does preventive care include?  A yearly physical exam. This is also called an annual well check.  Dental exams once or twice a year.  Routine eye exams. Ask your health care provider how often you should have your eyes checked.  Personal lifestyle choices, including:  Daily care of your teeth and gums.  Regular physical activity.  Eating a healthy diet.  Avoiding tobacco and drug use.  Limiting alcohol use.  Practicing safe sex.  Taking low-dose aspirin every day.  Taking vitamin and mineral supplements as recommended by your health care provider. What happens during an annual well check? The services and screenings  done by your health care provider during your annual well check will depend on your age, overall health, lifestyle risk factors, and family history of disease. Counseling  Your health care provider may ask you questions about your:  Alcohol use.  Tobacco use.  Drug use.  Emotional well-being.  Home and relationship well-being.  Sexual activity.  Eating habits.  History of falls.  Memory and ability to understand (cognition).  Work and work Statistician.  Reproductive health. Screening  You may have the following tests or measurements:  Height, weight, and BMI.  Blood pressure.  Lipid and cholesterol levels. These may be checked every 5 years, or more frequently if you are over 48 years old.  Skin check.  Lung cancer screening. You may have this screening every year starting at age 74 if you have a 30-pack-year history of smoking and currently smoke or have quit within the past 15 years.  Fecal occult blood test (FOBT) of the stool. You may have this test every year starting at age 74.  Flexible sigmoidoscopy or colonoscopy. You may have a sigmoidoscopy every 5 years or a colonoscopy every 10 years starting at age 74.  Hepatitis C blood test.  Hepatitis B blood test.  Sexually transmitted disease (STD) testing.  Diabetes screening. This is done by checking your blood sugar (glucose) after you have not eaten for a while (fasting). You may have this done every 1-3 years.  Bone density scan. This is done to screen for osteoporosis. You may have this done starting at age 74.  Mammogram. This may be done every 1-2 years. Talk to your health care provider about how often you should have regular mammograms. Talk with your health care  provider about your test results, treatment options, and if necessary, the need for more tests. Vaccines  Your health care provider may recommend certain vaccines, such as:  Influenza vaccine. This is recommended every year.  Tetanus,  diphtheria, and acellular pertussis (Tdap, Td) vaccine. You may need a Td booster every 10 years.  Zoster vaccine. You may need this after age 22.  Pneumococcal 13-valent conjugate (PCV13) vaccine. One dose is recommended after age 18.  Pneumococcal polysaccharide (PPSV23) vaccine. One dose is recommended after age 74. Talk to your health care provider about which screenings and vaccines you need and how often you need them. This information is not intended to replace advice given to you by your health care provider. Make sure you discuss any questions you have with your health care provider. Document Released: 05/08/2015 Document Revised: 12/30/2015 Document Reviewed: 02/10/2015 Elsevier Interactive Patient Education  2017 [employer] Prevention in the Home Falls can cause injuries. They can happen to people of all ages. There are many things you can do to make your home safe and to help prevent falls. What can I do on the outside of my home?  Regularly fix the edges of walkways and driveways and fix any cracks.  Remove anything that might make you trip as you walk through a door, such as a raised step or threshold.  Trim any bushes or trees on the path to your home.  Use bright outdoor lighting.  Clear any walking paths of anything that might make someone trip, such as rocks or tools.  Regularly check to see if handrails are loose or broken. Make sure that both sides of any steps have handrails.  Any raised decks and porches should have guardrails on the edges.  Have any leaves, snow, or ice cleared regularly.  Use sand or salt on walking paths during winter.  Clean up any spills in your garage right away. This includes oil or grease spills. What can I do in the bathroom?  Use night lights.  Install grab bars by the toilet and in the tub and shower. Do not use towel bars as grab bars.  Use non-skid mats or decals in the tub or shower.  If you need to sit down in  the shower, use a plastic, non-slip stool.  Keep the floor dry. Clean up any water that spills on the floor as soon as it happens.  Remove soap buildup in the tub or shower regularly.  Attach bath mats securely with double-sided non-slip rug tape.  Do not have throw rugs and other things on the floor that can make you trip. What can I do in the bedroom?  Use night lights.  Make sure that you have a light by your bed that is easy to reach.  Do not use any sheets or blankets that are too big for your bed. They should not hang down onto the floor.  Have a firm chair that has side arms. You can use this for support while you get dressed.  Do not have throw rugs and other things on the floor that can make you trip. What can I do in the kitchen?  Clean up any spills right away.  Avoid walking on wet floors.  Keep items that you use a lot in easy-to-reach places.  If you need to reach something above you, use a strong step stool that has a grab bar.  Keep electrical cords out of the way.  Do not use floor polish  or wax that makes floors slippery. If you must use wax, use non-skid floor wax.  Do not have throw rugs and other things on the floor that can make you trip. What can I do with my stairs?  Do not leave any items on the stairs.  Make sure that there are handrails on both sides of the stairs and use them. Fix handrails that are broken or loose. Make sure that handrails are as long as the stairways.  Check any carpeting to make sure that it is firmly attached to the stairs. Fix any carpet that is loose or worn.  Avoid having throw rugs at the top or bottom of the stairs. If you do have throw rugs, attach them to the floor with carpet tape.  Make sure that you have a light switch at the top of the stairs and the bottom of the stairs. If you do not have them, ask someone to add them for you. What else can I do to help prevent falls?  Wear shoes that:  Do not have high  heels.  Have rubber bottoms.  Are comfortable and fit you well.  Are closed at the toe. Do not wear sandals.  If you use a stepladder:  Make sure that it is fully opened. Do not climb a closed stepladder.  Make sure that both sides of the stepladder are locked into place.  Ask someone to hold it for you, if possible.  Clearly mark and make sure that you can see:  Any grab bars or handrails.  First and last steps.  Where the edge of each step is.  Use tools that help you move around (mobility aids) if they are needed. These include:  Canes.  Walkers.  Scooters.  Crutches.  Turn on the lights when you go into a dark area. Replace any light bulbs as soon as they burn out.  Set up your furniture so you have a clear path. Avoid moving your furniture around.  If any of your floors are uneven, fix them.  If there are any pets around you, be aware of where they are.  Review your medicines with your doctor. Some medicines can make you feel dizzy. This can increase your chance of falling. Ask your doctor what other things that you can do to help prevent falls. This information is not intended to replace advice given to you by your health care provider. Make sure you discuss any questions you have with your health care provider. Document Released: 02/05/2009 Document Revised: 09/17/2015 Document Reviewed: 05/16/2014 Elsevier Interactive Patient Education  2017 [employer].

## 2019-11-29 NOTE — Telephone Encounter (Signed)
ERx 

## 2019-12-04 ENCOUNTER — Encounter: Payer: Medicare Other | Admitting: Family Medicine

## 2019-12-10 ENCOUNTER — Telehealth: Payer: Self-pay | Admitting: *Deleted

## 2019-12-10 NOTE — Telephone Encounter (Signed)
(  12/10/2019) Left message for pt to notify them that it is time to schedule annual low dose lung cancer screening CT scan. Instructed patient to call back to verify information prior to the scan being scheduled SRW

## 2019-12-17 ENCOUNTER — Other Ambulatory Visit: Payer: Self-pay | Admitting: Family Medicine

## 2019-12-28 ENCOUNTER — Telehealth: Payer: Self-pay | Admitting: *Deleted

## 2019-12-28 DIAGNOSIS — Z87891 Personal history of nicotine dependence: Secondary | ICD-10-CM

## 2019-12-28 DIAGNOSIS — Z122 Encounter for screening for malignant neoplasm of respiratory organs: Secondary | ICD-10-CM

## 2019-12-28 NOTE — Telephone Encounter (Signed)
Left a voicemail to inform the patient that it is time schedule her lung cancer screening scan. Instructed patient to call back to update her information and schedule CT scan.

## 2019-12-31 ENCOUNTER — Other Ambulatory Visit: Payer: Self-pay

## 2019-12-31 ENCOUNTER — Ambulatory Visit (INDEPENDENT_AMBULATORY_CARE_PROVIDER_SITE_OTHER): Payer: Medicare Other | Admitting: Family Medicine

## 2019-12-31 ENCOUNTER — Encounter: Payer: Self-pay | Admitting: Family Medicine

## 2019-12-31 VITALS — BP 130/64 | HR 80 | Temp 97.9°F | Ht 61.0 in | Wt 127.0 lb

## 2019-12-31 DIAGNOSIS — R7989 Other specified abnormal findings of blood chemistry: Secondary | ICD-10-CM | POA: Diagnosis not present

## 2019-12-31 DIAGNOSIS — K2270 Barrett's esophagus without dysplasia: Secondary | ICD-10-CM

## 2019-12-31 DIAGNOSIS — E559 Vitamin D deficiency, unspecified: Secondary | ICD-10-CM

## 2019-12-31 DIAGNOSIS — Z0001 Encounter for general adult medical examination with abnormal findings: Secondary | ICD-10-CM

## 2019-12-31 DIAGNOSIS — F17200 Nicotine dependence, unspecified, uncomplicated: Secondary | ICD-10-CM | POA: Diagnosis not present

## 2019-12-31 DIAGNOSIS — Z7189 Other specified counseling: Secondary | ICD-10-CM | POA: Diagnosis not present

## 2019-12-31 DIAGNOSIS — Z23 Encounter for immunization: Secondary | ICD-10-CM | POA: Diagnosis not present

## 2019-12-31 DIAGNOSIS — R011 Cardiac murmur, unspecified: Secondary | ICD-10-CM

## 2019-12-31 DIAGNOSIS — I6523 Occlusion and stenosis of bilateral carotid arteries: Secondary | ICD-10-CM

## 2019-12-31 DIAGNOSIS — E785 Hyperlipidemia, unspecified: Secondary | ICD-10-CM

## 2019-12-31 DIAGNOSIS — E1122 Type 2 diabetes mellitus with diabetic chronic kidney disease: Secondary | ICD-10-CM

## 2019-12-31 DIAGNOSIS — M8580 Other specified disorders of bone density and structure, unspecified site: Secondary | ICD-10-CM

## 2019-12-31 DIAGNOSIS — J432 Centrilobular emphysema: Secondary | ICD-10-CM

## 2019-12-31 DIAGNOSIS — E118 Type 2 diabetes mellitus with unspecified complications: Secondary | ICD-10-CM

## 2019-12-31 DIAGNOSIS — E875 Hyperkalemia: Secondary | ICD-10-CM

## 2019-12-31 DIAGNOSIS — I771 Stricture of artery: Secondary | ICD-10-CM

## 2019-12-31 DIAGNOSIS — E1169 Type 2 diabetes mellitus with other specified complication: Secondary | ICD-10-CM | POA: Diagnosis not present

## 2019-12-31 DIAGNOSIS — J449 Chronic obstructive pulmonary disease, unspecified: Secondary | ICD-10-CM

## 2019-12-31 DIAGNOSIS — N183 Chronic kidney disease, stage 3 unspecified: Secondary | ICD-10-CM

## 2019-12-31 DIAGNOSIS — I2510 Atherosclerotic heart disease of native coronary artery without angina pectoris: Secondary | ICD-10-CM

## 2019-12-31 DIAGNOSIS — I700 Atherosclerosis of aorta: Secondary | ICD-10-CM

## 2019-12-31 DIAGNOSIS — I10 Essential (primary) hypertension: Secondary | ICD-10-CM

## 2019-12-31 DIAGNOSIS — K7460 Unspecified cirrhosis of liver: Secondary | ICD-10-CM

## 2019-12-31 DIAGNOSIS — N1832 Chronic kidney disease, stage 3b: Secondary | ICD-10-CM

## 2019-12-31 DIAGNOSIS — G459 Transient cerebral ischemic attack, unspecified: Secondary | ICD-10-CM

## 2019-12-31 DIAGNOSIS — N3946 Mixed incontinence: Secondary | ICD-10-CM

## 2019-12-31 DIAGNOSIS — N179 Acute kidney failure, unspecified: Secondary | ICD-10-CM | POA: Diagnosis not present

## 2019-12-31 DIAGNOSIS — K2100 Gastro-esophageal reflux disease with esophagitis, without bleeding: Secondary | ICD-10-CM

## 2019-12-31 DIAGNOSIS — Z0000 Encounter for general adult medical examination without abnormal findings: Secondary | ICD-10-CM

## 2019-12-31 DIAGNOSIS — H9193 Unspecified hearing loss, bilateral: Secondary | ICD-10-CM

## 2019-12-31 LAB — RENAL FUNCTION PANEL
Albumin: 3.8 g/dL (ref 3.5–5.2)
BUN: 21 mg/dL (ref 6–23)
CO2: 27 mEq/L (ref 19–32)
Calcium: 9.3 mg/dL (ref 8.4–10.5)
Chloride: 104 mEq/L (ref 96–112)
Creatinine, Ser: 1.27 mg/dL — ABNORMAL HIGH (ref 0.40–1.20)
GFR: 41.1 mL/min — ABNORMAL LOW (ref >60.00)
Glucose, Bld: 113 mg/dL — ABNORMAL HIGH (ref 70–99)
Phosphorus: 3.7 mg/dL (ref 2.3–4.6)
Potassium: 5.4 mEq/L — ABNORMAL HIGH (ref 3.5–5.1)
Sodium: 138 mEq/L (ref 135–145)

## 2019-12-31 IMAGING — MG DIGITAL SCREENING BILATERAL MAMMOGRAM WITH TOMO AND CAD
6 of 10 series · 6 of 30 positions shown · non-contrast
Comparison: Previous exam(s).

CLINICAL DATA: Screening.

EXAM:
DIGITAL SCREENING BILATERAL MAMMOGRAM WITH TOMO AND CAD

[R MLO synth-2D (1 of 2)]
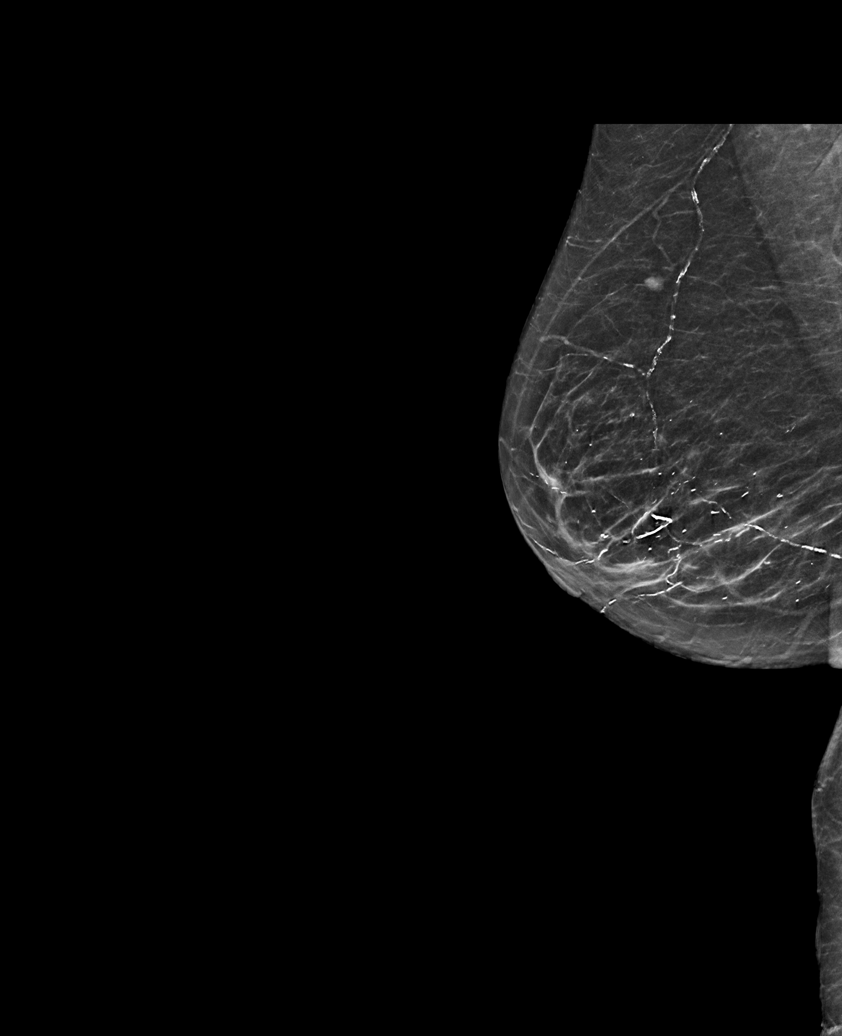

[R MLO synth-2D (2 of 2)]
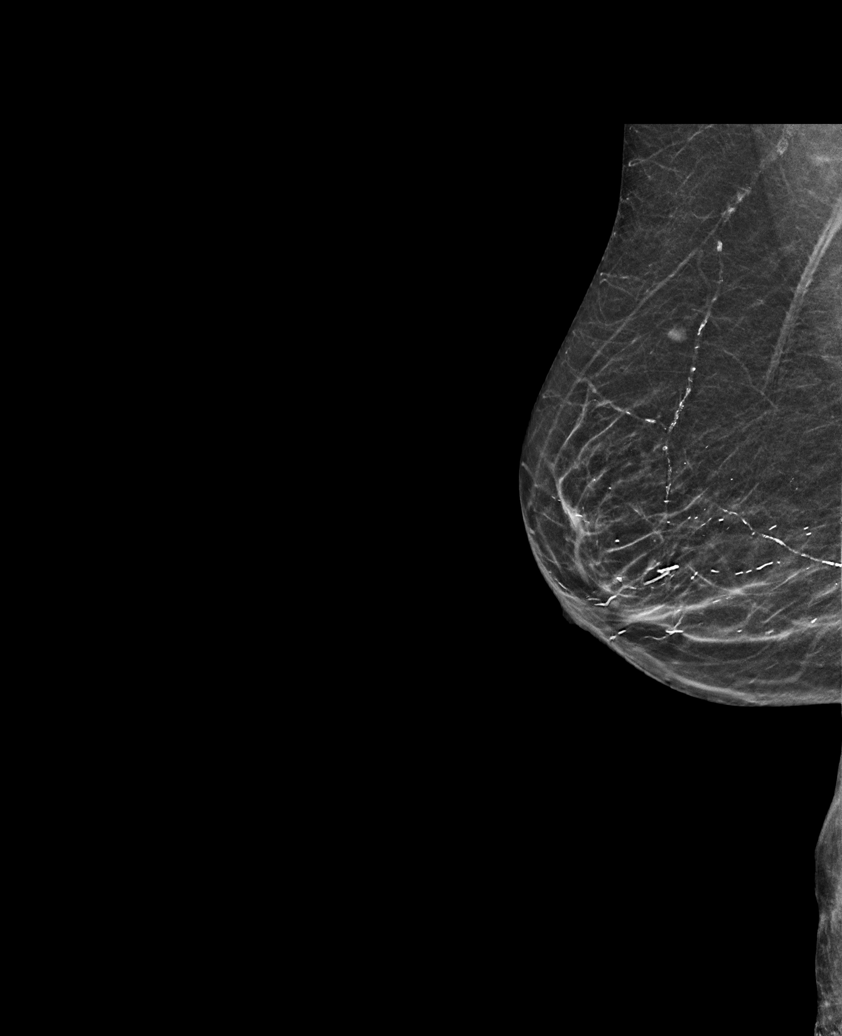

[L CC synth-2D]
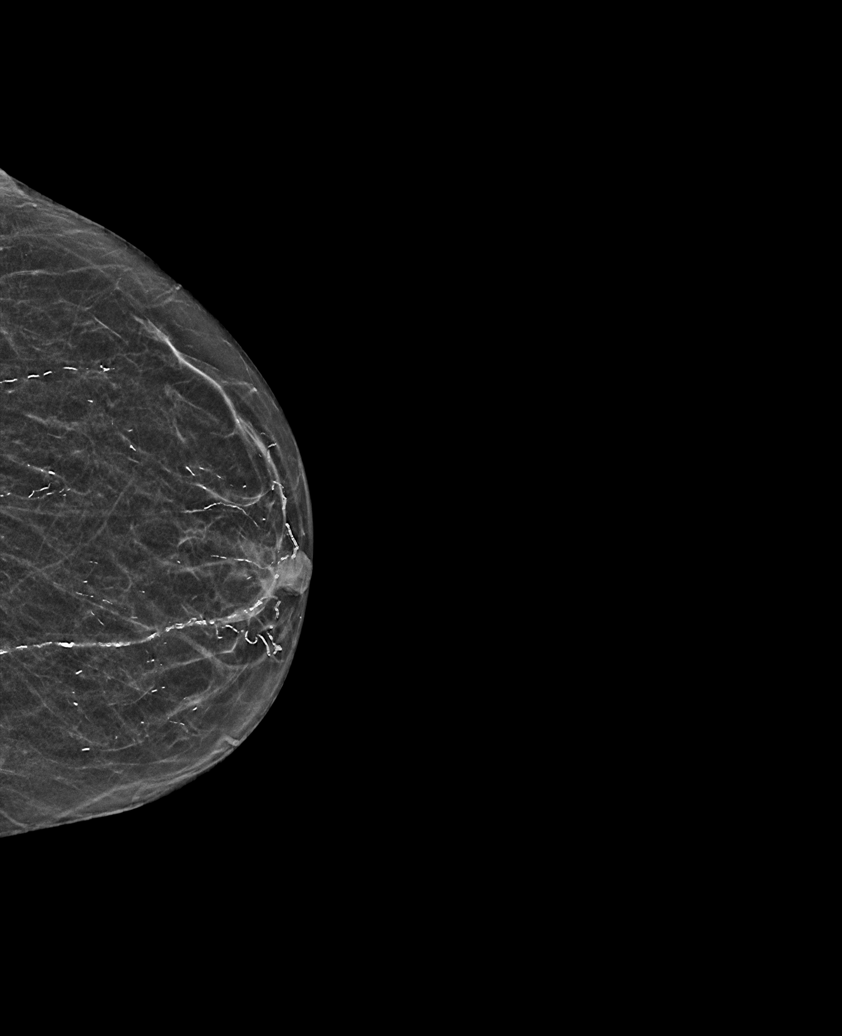

[L MLO synth-2D]
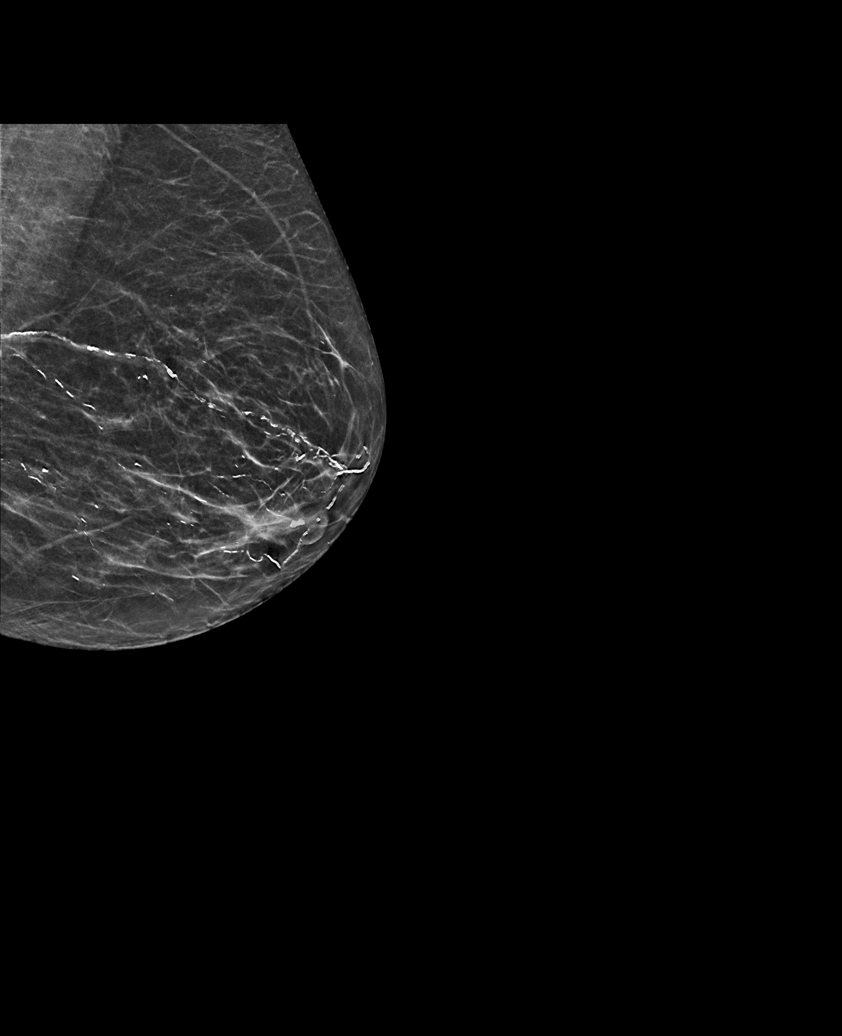

[R CC synth-2D]
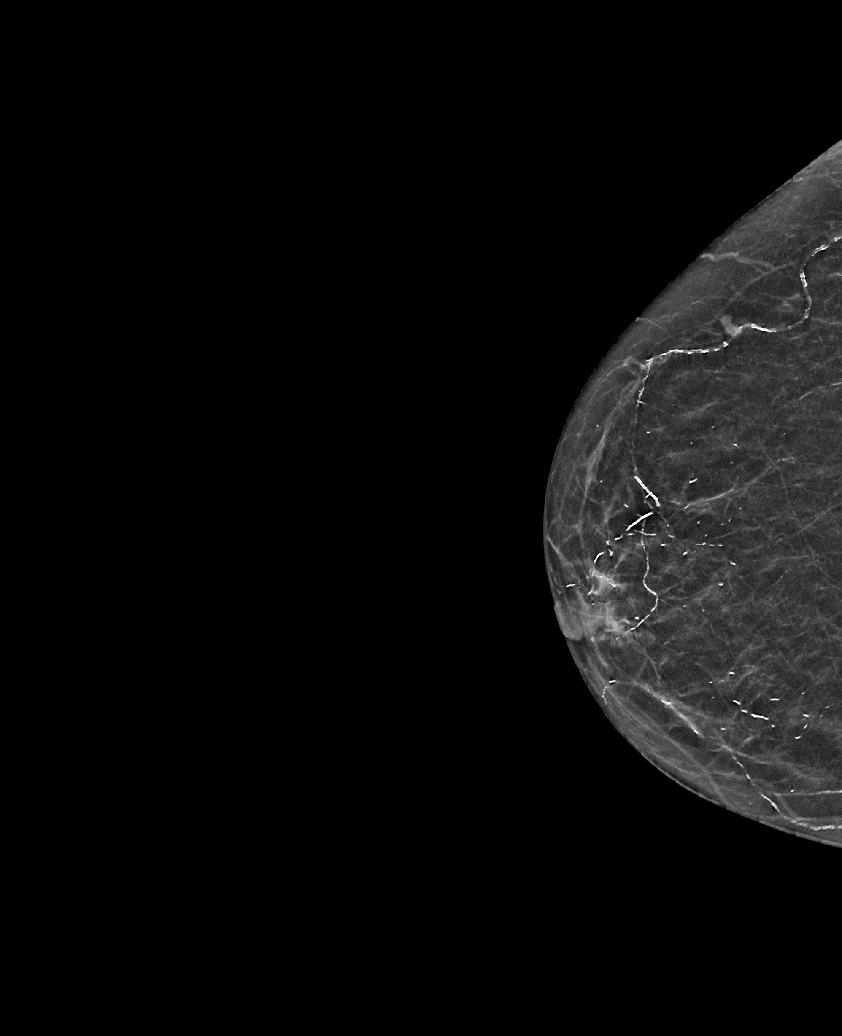

[R CC tomo · tomo slice 21/42.0]
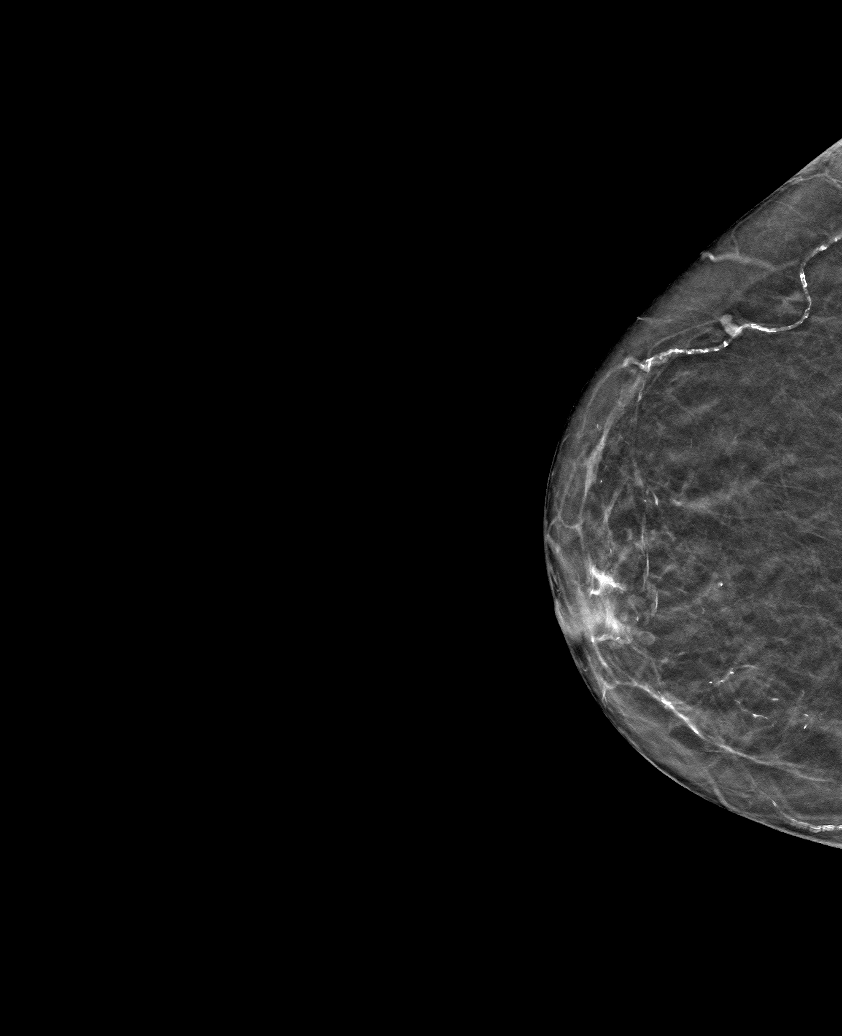

[6 of 30 positions shown; findings below may reference images not displayed]

ACR Breast Density Category b: There are scattered areas of
fibroglandular density.
FINDINGS: There are no findings suspicious for malignancy. Images were
processed with CAD.
IMPRESSION: No mammographic evidence of malignancy. A result letter of this
screening mammogram will be mailed directly to the patient.

RECOMMENDATION:
Screening mammogram in one year. (Code:[id number])

BI-RADS CATEGORY  1: Negative.

## 2019-12-31 NOTE — Patient Instructions (Addendum)
Flu shot today  Back off coffee intake.  Repeat kidney function today, we will also check kidney ultrasound.  We will repeat carotid ultrasound as well.  We will refer you back to audiology for formal hearing test.  I'm glad you got your colonoscopy  We will order bone density scan - to schedule on same day as your mammogram 05/2020.  If interested, check with pharmacy about new 2 shot shingles series (shingrix).  Thanks for bringing in advanced directives.  Return as needed or in 6 months for follow up visit.   Health Maintenance After Age 26 After age 70, you are at a higher risk for certain long-term diseases and infections as well as injuries from falls. Falls are a major cause of broken bones and head injuries in people who are older than age 42. Getting regular preventive care can help to keep you healthy and well. Preventive care includes getting regular testing and making lifestyle changes as recommended by your health care provider. Talk with your health care provider about:  Which screenings and tests you should have. A screening is a test that checks for a disease when you have no symptoms.  A diet and exercise plan that is right for you. What should I know about screenings and tests to prevent falls? Screening and testing are the best ways to find a health problem early. Early diagnosis and treatment give you the best chance of managing medical conditions that are common after age 60. Certain conditions and lifestyle choices may make you more likely to have a fall. Your health care provider may recommend:  Regular vision checks. Poor vision and conditions such as cataracts can make you more likely to have a fall. If you wear glasses, make sure to get your prescription updated if your vision changes.  Medicine review. Work with your health care provider to regularly review all of the medicines you are taking, including over-the-counter medicines. Ask your health care provider about  any side effects that may make you more likely to have a fall. Tell your health care provider if any medicines that you take make you feel dizzy or sleepy.  Osteoporosis screening. Osteoporosis is a condition that causes the bones to get weaker. This can make the bones weak and cause them to break more easily.  Blood pressure screening. Blood pressure changes and medicines to control blood pressure can make you feel dizzy.  Strength and balance checks. Your health care provider may recommend certain tests to check your strength and balance while standing, walking, or changing positions.  Foot health exam. Foot pain and numbness, as well as not wearing proper footwear, can make you more likely to have a fall.  Depression screening. You may be more likely to have a fall if you have a fear of falling, feel emotionally low, or feel unable to do activities that you used to do.  Alcohol use screening. Using too much alcohol can affect your balance and may make you more likely to have a fall. What actions can I take to lower my risk of falls? General instructions  Talk with your health care provider about your risks for falling. Tell your health care provider if: ? You fall. Be sure to tell your health care provider about all falls, even ones that seem minor. ? You feel dizzy, sleepy, or off-balance.  Take over-the-counter and prescription medicines only as told by your health care provider. These include any supplements.  Eat a healthy diet and maintain  a healthy weight. A healthy diet includes low-fat dairy products, low-fat (lean) meats, and fiber from whole grains, beans, and lots of fruits and vegetables. Home safety  Remove any tripping hazards, such as rugs, cords, and clutter.  Install safety equipment such as grab bars in bathrooms and safety rails on stairs.  Keep rooms and walkways well-lit. Activity   Follow a regular exercise program to stay fit. This will help you maintain  your balance. Ask your health care provider what types of exercise are appropriate for you.  If you need a cane or walker, use it as recommended by your health care provider.  Wear supportive shoes that have nonskid soles. Lifestyle  Do not drink alcohol if your health care provider tells you not to drink.  If you drink alcohol, limit how much you have: ? 0-1 drink a day for women. ? 0-2 drinks a day for men.  Be aware of how much alcohol is in your drink. In the [place] one drink equals one typical bottle of beer (12 oz), one-half glass of wine (5 oz), or one shot of hard liquor (1 oz).  Do not use any products that contain nicotine or tobacco, such as cigarettes and e-cigarettes. If you need help quitting, ask your health care provider. Summary  Having a healthy lifestyle and getting preventive care can help to protect your health and wellness after age 28.  Screening and testing are the best way to find a health problem early and help you avoid having a fall. Early diagnosis and treatment give you the best chance for managing medical conditions that are more common for people who are older than age 24.  Falls are a major cause of broken bones and head injuries in people who are older than age 40. Take precautions to prevent a fall at home.  Work with your health care provider to learn what changes you can make to improve your health and wellness and to prevent falls. This information is not intended to replace advice given to you by your health care provider. Make sure you discuss any questions you have with your health care provider. Document Revised: 08/02/2018 Document Reviewed: 02/22/2017 Elsevier Patient Education  2020 [employer].

## 2019-12-31 NOTE — Progress Notes (Signed)
This visit was conducted in person.  BP 130/64 (BP Location: Left Arm, Patient Position: Sitting, Cuff Size: Normal)   Pulse 80   Temp 97.9 F (36.6 C) (Temporal)   Ht 5\' 1"  (1.549 m)   Wt 127 lb (57.6 kg)   SpO2 92%   BMI 24.00 kg/m    CC: CPE Subjective:    Patient ID: Cheryl Bird, female    DOB: 1946/02/15, 74 y.o.   MRN: [id number]  HPI: Cheryl Bird is a 74 y.o. female presenting on 12/31/2019 for Annual Exam (Prt 2.  Pt provided living will to copy. ) and Diarrhea (C/o diarrhea.  Started this morning. )   Saw health advisor last month for medicare wellness visit. Note reviewed. Agrees to DEXA scan. Notes worsening hearing - known h/o R ear nerve deafness after bad infection. Agrees to audiology referral.   No exam data present    Clinical Support from 11/26/2019 in [place] at [facility] Total Score 0      Fall Risk  11/26/2019 06/08/2018 05/31/2017 05/27/2016 06/02/2015  Falls in the past year? 0 0 Yes No No  Comment - - 2 falls during inclement weather; 1 fall due to hole in yard; denies injury with any fall - -  Number falls in past yr: 0 - 2 or more - -  Injury with Fall? 0 - No - -  Risk for fall due to : Medication side effect - - - -  Follow up Falls evaluation completed;Falls prevention discussed - - - -   Recent lung cancer screening CT suggested cirrhosis (?NAFLD) along with CAD and aortic ATH.   Saw [facility] 07/2019 - barrett's esophagus without dysplasia on latest EGD started on omeprazole 20mg  (pantoprazole may have caused diarrhea), rec rpt colonoscopy, due for rpt GI visit. Suspected early cirrhosis - liver panel returned ok (AMA, ANA (low titer positive), ASMA, a1a, cerulopasmin, iron panel.   Notes weight loss over the past month (4 lbs).   Preventative: COLONOSCOPY WITH PROPOFOL2/08/2016;TAx3, rpt 3 yrs(Martin U [place]ul[place]ie, MD) ESOPHAGOGASTRODUODENOSCOPY (EGD) WITH PROPOFOL 03/30/2018 - gastritis, reflux gastroesophagitis,  +barrett's, H pylori neg, rpt 3 yrs Gustavo Lah, Billie Ruddy, MD) COLONOSCOPY WITH PROPOFOL 11/20/2019 - diverticulosis, int hem, no rpt recommended Alice Reichert, Benay Pike, MD) Last pap smear - 11/2011 WNL decided to aged out.Lastpelvic [id number]. Mammo - 05/2019 Birads1  Lung cancer screening -undergoing screening started 2018  DEXA2/2017 osteopenia -T -1.4 femur, forearm -0.7. Walks daily. Calcium in diet. Flu shotyearly Td 11/2011 COVID vaccine - [place] 07/2019, 08/2019 Pneumovax 8/32013, prevnar 12/2013 shingrix- declined  Advanced directives: brings copy today which will be scanned. Would want HCPOA to be daughters Bary Richard. Son in Sports coach is Therapist, sports of finances. Wants trial of life supportif deemed reversible - otherwise doesn't want prolonged life support. Does not want artificial nutrition/hydration. Has spoken with daughter/son in law about this.  Seat belt use discussed.  Sunscreen use discussed. No changing moles on skin. Current smokerdown to 2-3 cig/wk! Alcohol - none- mother was alcoholic Dentist yearly Eye exam yearly Bowel - no constipation  Bladder - ongoing incontinence on oxybutynin PRN some incomplete emptying, stress incontinence symptoms   Caffeine: 2 cups coffee/day  Grew up in [place]  Occupation: retired  Edu: HS  Activity: walks daily 25min  Diet: good water, fruits/vegetables daily     Relevant past medical, surgical, family and social history reviewed and updated as indicated. Interim medical history since our last visit reviewed. Allergies  and medications reviewed and updated. Outpatient Medications Prior to Visit  Medication Sig Dispense Refill  . albuterol (VENTOLIN HFA) 108 (90 Base) MCG/ACT inhaler Inhale 2 puffs into the lungs every 6 (six) hours as needed for wheezing. 18 g 6  . amLODipine (NORVASC) 5 MG tablet TAKE 1 TABLET BY MOUTH EVERY DAY 90 tablet 0  . aspirin EC 81 MG tablet Take 1 tablet (81 mg total) by mouth every other day.      Marland Kitchen atorvastatin (LIPITOR) 20 MG tablet TAKE 1 TABLET (20 MG TOTAL) BY MOUTH DAILY AT 6 PM. 90 tablet 0  . Calcium Carbonate-Vitamin D (CALCIUM 600/VITAMIN D) 600-400 MG-UNIT chew tablet Chew 1 tablet by mouth daily.    . D3-50 1.25 MG (50000 UT) capsule TAKE 1 CAPULE BY MOUTH ONE TIME PER WEEK 12 capsule 0  . fluticasone (FLONASE) 50 MCG/ACT nasal spray SPRAY 2 SPRAYS INTO EACH NOSTRIL EVERY DAY 48 mL 1  . lisinopril (ZESTRIL) 2.5 MG tablet TAKE 1 TABLET BY MOUTH EVERY DAY 90 tablet 0  . metFORMIN (GLUCOPHAGE) 1000 MG tablet TAKE 1 TABLET (1,000 MG TOTAL) BY MOUTH 2 (TWO) TIMES DAILY WITH A MEAL. 180 tablet 0  . omeprazole (PRILOSEC) 20 MG capsule Take by mouth.    . oxybutynin (DITROPAN) 5 MG tablet TAKE 1 TABLET (5 MG TOTAL) BY MOUTH 2 (TWO) TIMES DAILY AS NEEDED (INCONTINENCE). 180 tablet 1  . sitaGLIPtin (JANUVIA) 50 MG tablet Take 1 tablet (50 mg total) by mouth daily. 90 tablet 3  . pantoprazole (PROTONIX) 40 MG tablet Take 1 tablet (40 mg total) by mouth every other day. 45 tablet 3   No facility-administered medications prior to visit.     Per HPI unless specifically indicated in ROS section below Review of Systems  Constitutional: Positive for appetite change (decreased). Negative for activity change, chills, fatigue, fever and unexpected weight change.  HENT: Negative for hearing loss.   Eyes: Negative for visual disturbance.  Respiratory: Positive for cough (intermittent). Negative for chest tightness, shortness of breath and wheezing.   Cardiovascular: Negative for chest pain, palpitations and leg swelling.  Gastrointestinal: Positive for abdominal pain and diarrhea. Negative for abdominal distention, blood in stool, constipation, nausea and vomiting.  Genitourinary: Negative for difficulty urinating and hematuria.  Musculoskeletal: Negative for arthralgias, myalgias and neck pain.  Skin: Negative for rash.  Neurological: Negative for dizziness, seizures, syncope and headaches.   Hematological: Negative for adenopathy. Does not bruise/bleed easily.  Psychiatric/Behavioral: Negative for dysphoric mood. The patient is not nervous/anxious.    Objective:  BP 130/64 (BP Location: Left Arm, Patient Position: Sitting, Cuff Size: Normal)   Pulse 80   Temp 97.9 F (36.6 C) (Temporal)   Ht 5\' 1"  (1.549 m)   Wt 127 lb (57.6 kg)   SpO2 92%   BMI 24.00 kg/m   Wt Readings from Last 3 Encounters:  12/31/19 127 lb (57.6 kg)  11/20/19 131 lb (59.4 kg)  06/05/19 132 lb 5 oz (60 kg)      Physical Exam Vitals and nursing note reviewed.  Constitutional:      General: She is not in acute distress.    Appearance: Normal appearance. She is well-developed. She is not ill-appearing.  HENT:     Head: Normocephalic and atraumatic.     Right Ear: Tympanic membrane, ear canal and external ear normal. Decreased hearing noted.     Left Ear: Tympanic membrane, ear canal and external ear normal. Decreased hearing noted.  Nose: Nose normal.  Eyes:     General: No scleral icterus.    Conjunctiva/sclera: Conjunctivae normal.     Pupils: Pupils are equal, round, and reactive to light.  Neck:     Thyroid: No thyroid mass, thyromegaly or thyroid tenderness.     Vascular: Carotid bruit (bilateral) present.  Cardiovascular:     Rate and Rhythm: Normal rate and regular rhythm.     Pulses: Normal pulses.          Radial pulses are 2+ on the right side and 2+ on the left side.     Heart sounds: Murmur (3/6 systolic USB) heard.   Pulmonary:     Effort: Pulmonary effort is normal. No respiratory distress.     Breath sounds: Rhonchi present. No wheezing or rales.     Comments: Coarse breath sounds throughout Abdominal:     General: Abdomen is flat. Bowel sounds are normal. There is no distension.     Palpations: Abdomen is soft. There is no mass.     Tenderness: There is no abdominal tenderness. There is no guarding or rebound.     Hernia: No hernia is present.  Musculoskeletal:         General: Normal range of motion.     Cervical back: Normal range of motion and neck supple.     Right lower leg: Edema (1+) present.     Left lower leg: Edema (1+) present.  Lymphadenopathy:     Cervical: No cervical adenopathy.  Skin:    General: Skin is warm and dry.     Findings: No rash.  Neurological:     General: No focal deficit present.     Mental Status: She is alert and oriented to person, place, and time.     Comments: CN grossly intact, station and gait intact  Psychiatric:        Mood and Affect: Mood normal.        Behavior: Behavior normal.        Thought Content: Thought content normal.        Judgment: Judgment normal.       Results for orders placed or performed in visit on 12/31/19  Renal function panel  Result Value Ref Range   Sodium 138 135 - 145 mEq/L   Potassium 5.4 No hemolysis seen (H) 3.5 - 5.1 mEq/L   Chloride 104 96 - 112 mEq/L   CO2 27 19 - 32 mEq/L   Albumin 3.8 3.5 - 5.2 g/dL   BUN 21 6 - 23 mg/dL   Creatinine, Ser 1.27 (H) 0.40 - 1.20 mg/dL   Glucose, Bld 113 (H) 70 - 99 mg/dL   Phosphorus 3.7 2.3 - 4.6 mg/dL   GFR 41.10 (L) >60.00 mL/min   Calcium 9.3 8.4 - 10.5 mg/dL   Lab Results  Component Value Date   HGBA1C 7.3 (H) 11/26/2019    Lab Results  Component Value Date   CHOL 150 11/26/2019   HDL 71.60 11/26/2019   LDLCALC 61 11/26/2019   LDLDIRECT 71.0 11/25/2016   TRIG 88.0 11/26/2019   CHOLHDL 2 11/26/2019    Lab Results  Component Value Date   TSH 4.07 11/26/2019   Assessment & Plan:  This visit occurred during the SARS-CoV-2 public health emergency.  Safety protocols were in place, including screening questions prior to the visit, additional usage of staff PPE, and extensive cleaning of exam room while observing appropriate contact time as indicated for disinfecting solutions.   Problem  List Items Addressed This Visit    Vitamin D deficiency    Continue weekly replacement.       Urinary incontinence, mixed     Ongoing, managed with oxybutynin 5mg  BID PRN      TIA (transient ischemic attack)    Continue aspirin QOD and statin.       Thoracic aortic atherosclerosis ([place])    By CT. Continue aspirin, statin.       Subclavian artery stenosis, left ([place])    Update [place].       Smoker    Minimal smoking - down to a few a week. Encouraged full cessation.       Osteopenia    Update DEXA scan. Discussed calcium in diet and regular weight bearing exercises.       Relevant Orders   DG Bone Density   Hyperlipidemia associated with type 2 diabetes mellitus (HCC)    Chronic, stable on lipitor - continue. The 10-year ASCVD risk score Mikey Bussing DC Brooke Bonito., et al., 2013) is: 45.4%   Values used to calculate the score:     Age: 68 years     Sex: Female     Is Non-Hispanic African American: No     Diabetic: Yes     Tobacco smoker: Yes     Systolic Blood Pressure: 381 mmHg     Is BP treated: Yes     HDL Cholesterol: 71.6 mg/dL     Total Cholesterol: 150 mg/dL        Hyperkalemia    Update levels today       HTN (hypertension)    Chronic, stable. Continue current regimen. H/o ACEI induced bump in Cr, low dose restarted 11/2017. See below.       Heart murmur, systolic    Murmur persists. Known severe aortic sclerosis, unclear stenosis. Latest echo 2019 with poor visualization of aortic valve. Consider updated echo if any valve symptoms develop.       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 Now noticing progressive L hearing loss - will refer to audiology for formal hearing evaluation and recommendations.       Relevant Orders   Ambulatory referral to Audiology   Health maintenance examination - Primary    Preventative protocols reviewed and updated unless pt declined. Discussed healthy diet and lifestyle.       GERD (gastroesophageal reflux disease)    Omeprazole 20mg  daily recently started - concerned propranolol QOD was causing diarrhea. H/o  barrett's and reflux gastroesophagitis. rec rpt EGD 2022.      Emphysema of lung (HCC)   COPD, moderate (HCC)    Stable period on PRN albuterol. Encouraged full smoking cessation.       Controlled diabetes mellitus type 2 with complications (HCC)    Chronic, stable period on full dose metformin and januvia. Continue current regimen.       CKD stage 3 due to type 2 diabetes mellitus (HCC)    Acute on chronic, with microalbuminuria - update renal panel.  Continue low dose ACEI - titration limited by h/o ARF to ACEI.  Goal BP <130/80.  Consider SPEP  Consider renal artery ultrasound.       Relevant Orders   US Renal   Cirrhosis of liver ([place])    New finding, followed by [facility] - recent labwork largely reassuring. Continue to monitor. Presumed NAFLD related.       Carotid stenosis  Update carotid US.       Relevant Orders   VAS US CAROTID   CAD (coronary artery disease)    Found on CT.  Continue aspirin, statin.  No current anginal symptoms.        Barrett esophagus    Latest EGD 2019 with persistent Barrett's. Planned rpt 2022 ([place] -> Lorie Apley)      Advanced care planning/counseling discussion    Advanced directives: brings copy today which will be scanned. Would want HCPOA to be daughters Bary Richard. Son in Sports coach is Therapist, sports of finances. Wants trial of life supportif deemed reversible - otherwise doesn't want prolonged life support. Does not want artificial nutrition/hydration. Has spoken with daughter/son in law about this.       Abnormal TSH    Stable period off medication.        Other Visit Diagnoses    Need for influenza vaccination       Relevant Orders   Flu Vaccine QUAD High Dose(Fluad) (Completed)   Acute renal failure, unspecified acute renal failure type ([place])       Relevant Orders   Renal function panel (Completed)   Acute renal failure superimposed on stage 3b chronic kidney disease, unspecified acute renal failure type ([place])             No orders of the defined types were placed in this encounter.  Orders Placed This Encounter  Procedures  . DG Bone Density    Standing Status:   Future    Standing Expiration Date:   01/01/2021    Order Specific Question:   Reason for Exam (SYMPTOM  OR DIAGNOSIS REQUIRED)    Answer:   osteopenia f/u    Order Specific Question:   Preferred imaging location?    Answer:   [facility]  . US Renal    Standing Status:   Future    Standing Expiration Date:   01/01/2021    Order Specific Question:   Reason for Exam (SYMPTOM  OR DIAGNOSIS REQUIRED)    Answer:   acute on chronic kidney disease    Order Specific Question:   Preferred imaging location?    Answer:   [facility]  . Flu Vaccine QUAD High Dose(Fluad)  . Renal function panel  . Ambulatory referral to Audiology    Referral Priority:   Routine    Referral Type:   Audiology Exam    Referral Reason:   Specialty Services Required    Number of Visits Requested:   1    Patient instructions: Flu shot today  Back off coffee intake.  Repeat kidney function today, we will also check kidney ultrasound.  We will repeat carotid ultrasound as well.  We will refer you back to audiology for formal hearing test.  I'm glad you got your colonoscopy  We will order bone density scan - to schedule on same day as your mammogram 05/2020.  If interested, check with pharmacy about new 2 shot shingles series (shingrix).  Thanks for bringing in advanced directives.  Return as needed or in 6 months for follow up visit.   Follow up plan: Return in about 6 months (around 06/29/2020) for follow up visit.  Ria Bush, MD

## 2020-01-01 NOTE — Addendum Note (Signed)
Addended by: Lieutenant Diego on: 01/01/2020 10:56 AM   Modules accepted: Orders

## 2020-01-01 NOTE — Telephone Encounter (Signed)
Contacted and scheduled. Current smoker, 57 pack year

## 2020-01-02 ENCOUNTER — Other Ambulatory Visit: Payer: Self-pay | Admitting: Family Medicine

## 2020-01-02 ENCOUNTER — Encounter: Payer: Self-pay | Admitting: Family Medicine

## 2020-01-02 DIAGNOSIS — E1122 Type 2 diabetes mellitus with diabetic chronic kidney disease: Secondary | ICD-10-CM

## 2020-01-02 DIAGNOSIS — H9190 Unspecified hearing loss, unspecified ear: Secondary | ICD-10-CM | POA: Insufficient documentation

## 2020-01-02 NOTE — Assessment & Plan Note (Addendum)
Advanced directives: brings copy today which will be scanned. Would want HCPOA to be daughters Bary Richard. Son in Sports coach is Therapist, sports of finances. Wants trial of life supportif deemed reversible - otherwise doesn't want prolonged life support. Does not want artificial nutrition/hydration. Has spoken with daughter/son in law about this.

## 2020-01-02 NOTE — Assessment & Plan Note (Signed)
Update levels today.  

## 2020-01-02 NOTE — Assessment & Plan Note (Signed)
Latest EGD 2019 with persistent Barrett's. Planned rpt 2022 ([place] -> [facility])

## 2020-01-02 NOTE — Assessment & Plan Note (Signed)
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 Now noticing progressive L hearing loss - will refer to audiology for formal hearing evaluation and recommendations.

## 2020-01-02 NOTE — Assessment & Plan Note (Signed)
Murmur persists. Known severe aortic sclerosis, unclear stenosis. Latest echo 2019 with poor visualization of aortic valve. Consider updated echo if any valve symptoms develop.

## 2020-01-02 NOTE — Assessment & Plan Note (Signed)
By CT. Continue aspirin, statin.

## 2020-01-02 NOTE — Assessment & Plan Note (Signed)
Minimal smoking - down to a few a week. Encouraged full cessation.

## 2020-01-02 NOTE — Assessment & Plan Note (Signed)
Update carotid US.  

## 2020-01-02 NOTE — Assessment & Plan Note (Addendum)
Omeprazole 20mg  daily recently started - concerned propranolol QOD was causing diarrhea. H/o barrett's and reflux gastroesophagitis. rec rpt EGD 2022.

## 2020-01-02 NOTE — Assessment & Plan Note (Signed)
Stable period on PRN albuterol. Encouraged full smoking cessation.

## 2020-01-02 NOTE — Assessment & Plan Note (Signed)
New finding, followed by [facility] - recent labwork largely reassuring. Continue to monitor. Presumed NAFLD related.

## 2020-01-02 NOTE — Assessment & Plan Note (Addendum)
Acute on chronic, with microalbuminuria - update renal panel.  Continue low dose ACEI - titration limited by h/o ARF to ACEI.  Goal BP <130/80.  Consider SPEP  Consider renal artery ultrasound.

## 2020-01-02 NOTE — Assessment & Plan Note (Signed)
Stable period off medication.  

## 2020-01-02 NOTE — Assessment & Plan Note (Signed)
Continue weekly replacement.  

## 2020-01-02 NOTE — Assessment & Plan Note (Addendum)
Update DEXA scan. Discussed calcium in diet and regular weight bearing exercises.

## 2020-01-02 NOTE — Assessment & Plan Note (Addendum)
Chronic, stable. Continue current regimen. H/o ACEI induced bump in Cr, low dose restarted 11/2017. See below.

## 2020-01-02 NOTE — Assessment & Plan Note (Signed)
Update [place].

## 2020-01-02 NOTE — Assessment & Plan Note (Addendum)
Found on CT.  Continue aspirin, statin.  No current anginal symptoms.

## 2020-01-02 NOTE — Assessment & Plan Note (Signed)
Chronic, stable on lipitor - continue. The 10-year ASCVD risk score Cheryl Bird DC Cheryl Bird., et al., 2013) is: 45.4%   Values used to calculate the score:     Age: 74 years     Sex: Female     Is Non-Hispanic African American: No     Diabetic: Yes     Tobacco smoker: Yes     Systolic Blood Pressure: 098 mmHg     Is BP treated: Yes     HDL Cholesterol: 71.6 mg/dL     Total Cholesterol: 150 mg/dL

## 2020-01-02 NOTE — Assessment & Plan Note (Signed)
Chronic, stable period on full dose metformin and januvia. Continue current regimen.

## 2020-01-02 NOTE — Assessment & Plan Note (Signed)
Continue aspirin QOD and statin.

## 2020-01-02 NOTE — Assessment & Plan Note (Signed)
Ongoing, managed with oxybutynin 5mg  BID PRN

## 2020-01-02 NOTE — Assessment & Plan Note (Signed)
Preventative protocols reviewed and updated unless pt declined. Discussed healthy diet and lifestyle.  

## 2020-01-10 ENCOUNTER — Other Ambulatory Visit: Payer: Self-pay

## 2020-01-10 ENCOUNTER — Ambulatory Visit
Admission: RE | Admit: 2020-01-10 | Discharge: 2020-01-10 | Disposition: A | Discharge status: Home / Self Care | Payer: Medicare Other | Source: Ambulatory Visit | Attending: Oncology | Admitting: Oncology

## 2020-01-10 DIAGNOSIS — Z122 Encounter for screening for malignant neoplasm of respiratory organs: Secondary | ICD-10-CM | POA: Diagnosis not present

## 2020-01-10 DIAGNOSIS — Z87891 Personal history of nicotine dependence: Secondary | ICD-10-CM | POA: Insufficient documentation

## 2020-01-10 DIAGNOSIS — F17210 Nicotine dependence, cigarettes, uncomplicated: Secondary | ICD-10-CM | POA: Diagnosis not present

## 2020-01-12 ENCOUNTER — Other Ambulatory Visit: Payer: Self-pay | Admitting: Family Medicine

## 2020-01-13 ENCOUNTER — Other Ambulatory Visit: Payer: Self-pay

## 2020-01-13 ENCOUNTER — Ambulatory Visit
Admission: RE | Admit: 2020-01-13 | Discharge: 2020-01-13 | Disposition: A | Discharge status: Home / Self Care | Payer: Medicare Other | Source: Ambulatory Visit | Attending: Family Medicine | Admitting: Family Medicine

## 2020-01-13 DIAGNOSIS — N183 Chronic kidney disease, stage 3 unspecified: Secondary | ICD-10-CM | POA: Insufficient documentation

## 2020-01-13 DIAGNOSIS — N189 Chronic kidney disease, unspecified: Secondary | ICD-10-CM | POA: Diagnosis not present

## 2020-01-13 DIAGNOSIS — E1122 Type 2 diabetes mellitus with diabetic chronic kidney disease: Secondary | ICD-10-CM | POA: Diagnosis not present

## 2020-01-14 ENCOUNTER — Encounter: Payer: Self-pay | Admitting: *Deleted

## 2020-01-14 DIAGNOSIS — H6123 Impacted cerumen, bilateral: Secondary | ICD-10-CM | POA: Diagnosis not present

## 2020-01-14 DIAGNOSIS — H903 Sensorineural hearing loss, bilateral: Secondary | ICD-10-CM | POA: Diagnosis not present

## 2020-01-14 DIAGNOSIS — J300 Vasomotor rhinitis: Secondary | ICD-10-CM | POA: Diagnosis not present

## 2020-01-30 ENCOUNTER — Other Ambulatory Visit: Payer: Self-pay

## 2020-01-30 ENCOUNTER — Ambulatory Visit (INDEPENDENT_AMBULATORY_CARE_PROVIDER_SITE_OTHER): Payer: Medicare Other

## 2020-01-30 DIAGNOSIS — I6523 Occlusion and stenosis of bilateral carotid arteries: Secondary | ICD-10-CM | POA: Diagnosis not present

## 2020-02-04 ENCOUNTER — Other Ambulatory Visit: Payer: Self-pay | Admitting: Family Medicine

## 2020-02-11 ENCOUNTER — Other Ambulatory Visit: Payer: Self-pay | Admitting: Gastroenterology

## 2020-02-11 DIAGNOSIS — K7460 Unspecified cirrhosis of liver: Secondary | ICD-10-CM | POA: Diagnosis not present

## 2020-02-11 DIAGNOSIS — K2270 Barrett's esophagus without dysplasia: Secondary | ICD-10-CM | POA: Diagnosis not present

## 2020-02-11 DIAGNOSIS — K580 Irritable bowel syndrome with diarrhea: Secondary | ICD-10-CM | POA: Diagnosis not present

## 2020-02-12 DIAGNOSIS — H905 Unspecified sensorineural hearing loss: Secondary | ICD-10-CM | POA: Diagnosis not present

## 2020-02-14 ENCOUNTER — Other Ambulatory Visit: Payer: Self-pay | Admitting: Family Medicine

## 2020-02-20 ENCOUNTER — Ambulatory Visit
Admission: RE | Admit: 2020-02-20 | Discharge: 2020-02-20 | Disposition: A | Discharge status: Home / Self Care | Payer: Medicare Other | Source: Ambulatory Visit | Attending: Gastroenterology | Admitting: Gastroenterology

## 2020-02-20 ENCOUNTER — Other Ambulatory Visit: Payer: Self-pay

## 2020-02-20 DIAGNOSIS — K7460 Unspecified cirrhosis of liver: Secondary | ICD-10-CM | POA: Insufficient documentation

## 2020-03-09 ENCOUNTER — Other Ambulatory Visit: Payer: Self-pay

## 2020-03-09 ENCOUNTER — Other Ambulatory Visit (INDEPENDENT_AMBULATORY_CARE_PROVIDER_SITE_OTHER): Payer: Medicare Other

## 2020-03-09 DIAGNOSIS — N183 Chronic kidney disease, stage 3 unspecified: Secondary | ICD-10-CM

## 2020-03-09 DIAGNOSIS — E1122 Type 2 diabetes mellitus with diabetic chronic kidney disease: Secondary | ICD-10-CM | POA: Diagnosis not present

## 2020-03-09 LAB — RENAL FUNCTION PANEL
Albumin: 3.4 g/dL — ABNORMAL LOW (ref 3.5–5.2)
BUN: 18 mg/dL (ref 6–23)
CO2: 26 mEq/L (ref 19–32)
Calcium: 9.2 mg/dL (ref 8.4–10.5)
Chloride: 104 mEq/L (ref 96–112)
Creatinine, Ser: 1.31 mg/dL — ABNORMAL HIGH (ref 0.40–1.20)
GFR: 40.14 mL/min — ABNORMAL LOW (ref >60.00)
Glucose, Bld: 111 mg/dL — ABNORMAL HIGH (ref 70–99)
Phosphorus: 4.2 mg/dL (ref 2.3–4.6)
Potassium: 4.8 mEq/L (ref 3.5–5.1)
Sodium: 139 mEq/L (ref 135–145)

## 2020-03-10 ENCOUNTER — Other Ambulatory Visit: Payer: Self-pay | Admitting: Family Medicine

## 2020-03-11 ENCOUNTER — Other Ambulatory Visit: Payer: Self-pay | Admitting: Family Medicine

## 2020-03-15 NOTE — Progress Notes (Signed)
    [name], MD, [name]  Primary Care and Sports Medicine [facility] [place], [address]  Phone: [phone]  FAX: [name]  MRN [id number]  Date of Birth: November 15, 1945  Date: 03/16/2020  PCP: Ria Bush, MD  Referral: Ria Bush, MD  Chief Complaint  Patient presents with  . Trigger Finger    Left Middle Finger    This visit occurred during the SARS-CoV-2 public health emergency.  Safety protocols were in place, including screening questions prior to the visit, additional usage of staff PPE, and extensive cleaning of exam room while observing appropriate contact time as indicated for disinfecting solutions.   Subjective:   Cheryl Bird is a 74 y.o. very pleasant female patient with Body mass index is 24.7 kg/m. who presents with the following:  She presents with hand pain and a trigger finger.  L trigger finger and the middle ? dupy  Tendon Sheath Injection Procedure Note [name] 11/05/1945 Date of procedure: 03/16/2020  Procedure: Tendon Sheath Injection for Trigger Finger, L 3rd Indications: Pain  Procedure Details Verbal consent was obtained. Risks (including potential risk for skin lightening and potential atrophy), benefits and alternatives were discussed. Prepped with Chloraprep and Ethyl Chloride used for anesthesia. Under sterile conditions, patient injected at palmar crease aiming distally with 45 degree angle towards nodule; injected directly into tendon sheath. Medication flowed freely without resistance.  Needle size: 22 gauge 1 1/2 inch Injection: 1/2 cc of Lidocaine 1% and Kenalog 20 mg Medication: 1/2 cc of Kenalog 40 mg (equaling Kenalog 20 mg) b  Classic trigger, injected once, and now has a very early trigger finger.   Signed,  Maud Deed. [name], MD

## 2020-03-16 ENCOUNTER — Ambulatory Visit (INDEPENDENT_AMBULATORY_CARE_PROVIDER_SITE_OTHER): Payer: Medicare Other | Admitting: Family Medicine

## 2020-03-16 ENCOUNTER — Encounter: Payer: Self-pay | Admitting: Family Medicine

## 2020-03-16 ENCOUNTER — Other Ambulatory Visit: Payer: Self-pay

## 2020-03-16 VITALS — BP 130/60 | HR 82 | Temp 98.0°F | Ht 61.0 in | Wt 130.8 lb

## 2020-03-16 DIAGNOSIS — M720 Palmar fascial fibromatosis [Dupuytren]: Secondary | ICD-10-CM

## 2020-03-16 DIAGNOSIS — M65332 Trigger finger, left middle finger: Secondary | ICD-10-CM | POA: Diagnosis not present

## 2020-03-16 MED ORDER — TRIAMCINOLONE ACETONIDE 40 MG/ML IJ SUSP
20.0000 mg | Freq: Once | INTRAMUSCULAR | Status: AC
  Administered 2020-03-16: 20 mg via INTRA_ARTICULAR

## 2020-03-25 ENCOUNTER — Telehealth: Payer: Self-pay

## 2020-03-25 NOTE — Telephone Encounter (Signed)
Received faxed rx form from [facility] for DM supplies.  Spoke with pt asking if she uses [facility] for DM supplies.  Pt confirms she does.  Faxed form.

## 2020-05-01 ENCOUNTER — Telehealth: Payer: Self-pay

## 2020-05-01 NOTE — Chronic Care Management (AMB) (Signed)
Chronic Care Management Pharmacy Assistant   Name: Cheryl Bird  MRN: [id number] DOB: 01/06/1946  Reason for Encounter: Medication Review  Patient Questions:  1.  Have you seen any other providers since your last visit? Yes 03/16/20 Dr. Frederico Hamman Copland- Family Practice- Trigger finger 02/11/20- Dr. Donnelly Angelica- Gastroenterology 12/31/19- Dr. Ria Bush- PCP  11/20/19- Dr. Olean Ree- Gastroenterology- Colonoscopy    2.  Any changes in your medicines or health? yes 12/31/19 Dr. Danise Mina discontinued pantoprazole.      PCP : Ria Bush, MD  Allergies:   Allergies  Allergen Reactions  . Ace Inhibitors Other (See Comments)    Hyperkalemia (even on 2.5mg  lisinopril), bump in creatinine  . Aleve [Naproxen Sodium] Itching    Medications: Outpatient Encounter Medications as of 05/01/2020  Medication Sig  . albuterol (VENTOLIN HFA) 108 (90 Base) MCG/ACT inhaler TAKE 2 PUFFS BY MOUTH EVERY 6 HOURS AS NEEDED FOR WHEEZE  . amLODipine (NORVASC) 5 MG tablet TAKE 1 TABLET BY MOUTH EVERY DAY  . aspirin EC 81 MG tablet Take 1 tablet (81 mg total) by mouth every other day.  Marland Kitchen atorvastatin (LIPITOR) 20 MG tablet TAKE 1 TABLET (20 MG TOTAL) BY MOUTH DAILY AT 6 PM.  . Calcium Carbonate-Vitamin D (CALCIUM 600/VITAMIN D) 600-400 MG-UNIT chew tablet Chew 1 tablet by mouth daily.  . D3-50 1.25 MG (50000 UT) capsule TAKE 1 CAPULE BY MOUTH ONE TIME PER WEEK  . ipratropium (ATROVENT) 0.03 % nasal spray SMARTSIG:1-2 Spray(s) Both Nares 3 Times Daily PRN  . JANUVIA 50 MG tablet TAKE 1 TABLET BY MOUTH EVERY DAY  . metFORMIN (GLUCOPHAGE) 1000 MG tablet TAKE 1 TABLET (1,000 MG TOTAL) BY MOUTH 2 (TWO) TIMES DAILY WITH A MEAL.  Marland Kitchen omeprazole (PRILOSEC) 20 MG capsule Take by mouth.  . oxybutynin (DITROPAN) 5 MG tablet TAKE 1 TABLET (5 MG TOTAL) BY MOUTH 2 (TWO) TIMES DAILY AS NEEDED (INCONTINENCE).   No facility-administered encounter medications on file as of 05/01/2020.    Current  Diagnosis: Patient Active Problem List   Diagnosis Date Noted  . Hearing loss 01/02/2020  . Cirrhosis of liver ([place]) 01/20/2019  . Subclavian artery stenosis, left ([place]) 12/22/2018  . Vitamin D deficiency 11/29/2018  . Bilateral serous otitis media 05/03/2018  . Emphysema of lung ([place]) 12/27/2017  . Right hand pain 10/15/2017  . Hyperkalemia 09/19/2017  . TIA (transient ischemic attack) 08/29/2017  . Heart murmur, systolic 47/82/9562  . CKD stage 3 due to type 2 diabetes mellitus ([place]) 06/05/2017  . CAD (coronary artery disease) 12/10/2016  . Thoracic aortic atherosclerosis ([place]) 12/10/2016  . Carotid stenosis 06/04/2016  . Background diabetic retinopathy ([place]) 02/01/2016  . Urinary incontinence, mixed 11/30/2015  . Health maintenance examination 06/02/2015  . Osteopenia 05/27/2015  . Advanced care planning/counseling discussion 05/28/2014  . Abnormal TSH 05/28/2014  . Rhinorrhea 01/09/2014  . COPD, moderate ([place]) 02/12/2013  . HTN (hypertension)   . GERD (gastroesophageal reflux disease)   . Barrett esophagus 02/24/2012  . Medicare annual wellness visit, subsequent 12/24/2011  . Controlled diabetes mellitus type 2 with complications ([place])   . Hyperlipidemia associated with type 2 diabetes mellitus ([place])   . Smoker    Patient contacted to remind her of her upcoming appointment and to have all medications and supplements available for review with [name], Pharm. D, at their telephone visit on 05/06/2020  Are you having any problems with your medications? Denies any trouble.   What concerns would like to discuss with  the pharmacist? States she does not have any concerns at this time but will write them down if she does.   Follow-Up:  Pharmacist Review   [name], CPP notified  Cheryl Bird, [place] Pharmacy Assistant [phone]

## 2020-05-06 ENCOUNTER — Telehealth: Payer: Self-pay

## 2020-05-06 ENCOUNTER — Telehealth: Payer: Medicare Other

## 2020-05-06 NOTE — Chronic Care Management (AMB) (Addendum)
Chronic Care Management Pharmacy Assistant   Name: Cheryl Bird  MRN: [id number] DOB: 1946-02-15  Reason for Encounter: Diabetes/Hypertension Medication Review   Patient Questions:  1.  Have you seen any other providers since your last visit? Yes 03/16/20 Dr. Frederico Hamman Copland- Family Practice- Trigger finger 02/11/20- Dr. Donnelly Angelica- Gastroenterology 12/31/19- Dr. Ria Bush- PCP  11/20/19- Dr. Olean Ree- Gastroenterology- Colonoscopy   2.  Any changes in your medicines or health? Yes 12/31/19 Dr. Danise Mina discontinued pantoprazole.               PCP : Ria Bush, MD  Allergies:   Allergies  Allergen Reactions   Ace Inhibitors Other (See Comments)    Hyperkalemia (even on 2.5mg  lisinopril), bump in creatinine   Aleve [Naproxen Sodium] Itching    Medications: Outpatient Encounter Medications as of 05/06/2020  Medication Sig   albuterol (VENTOLIN HFA) 108 (90 Base) MCG/ACT inhaler TAKE 2 PUFFS BY MOUTH EVERY 6 HOURS AS NEEDED FOR WHEEZE   amLODipine (NORVASC) 5 MG tablet TAKE 1 TABLET BY MOUTH EVERY DAY   aspirin EC 81 MG tablet Take 1 tablet (81 mg total) by mouth every other day.   atorvastatin (LIPITOR) 20 MG tablet TAKE 1 TABLET (20 MG TOTAL) BY MOUTH DAILY AT 6 PM.   Calcium Carbonate-Vitamin D (CALCIUM 600/VITAMIN D) 600-400 MG-UNIT chew tablet Chew 1 tablet by mouth daily.   D3-50 1.25 MG (50000 UT) capsule TAKE 1 CAPULE BY MOUTH ONE TIME PER WEEK   ipratropium (ATROVENT) 0.03 % nasal spray SMARTSIG:1-2 Spray(s) Both Nares 3 Times Daily PRN   JANUVIA 50 MG tablet TAKE 1 TABLET BY MOUTH EVERY DAY   metFORMIN (GLUCOPHAGE) 1000 MG tablet TAKE 1 TABLET (1,000 MG TOTAL) BY MOUTH 2 (TWO) TIMES DAILY WITH A MEAL.   omeprazole (PRILOSEC) 20 MG capsule Take by mouth.   oxybutynin (DITROPAN) 5 MG tablet TAKE 1 TABLET (5 MG TOTAL) BY MOUTH 2 (TWO) TIMES DAILY AS NEEDED (INCONTINENCE).   No facility-administered encounter medications on file as of  05/06/2020.    Current Diagnosis: Patient Active Problem List   Diagnosis Date Noted   Hearing loss 01/02/2020   Cirrhosis of liver ([place]) 01/20/2019   Subclavian artery stenosis, left ([facility]) 12/22/2018   Vitamin D deficiency 11/29/2018   Bilateral serous otitis media 05/03/2018   Emphysema of lung ([place]) 12/27/2017   Right hand pain 10/15/2017   Hyperkalemia 09/19/2017   TIA (transient ischemic attack) 08/29/2017   Heart murmur, systolic 10/93/2355   CKD stage 3 due to type 2 diabetes mellitus ([place]) 06/05/2017   CAD (coronary artery disease) 12/10/2016   Thoracic aortic atherosclerosis ([place]) 12/10/2016   Carotid stenosis 06/04/2016   Background diabetic retinopathy ([place]) 02/01/2016   Urinary incontinence, mixed 11/30/2015   Health maintenance examination 06/02/2015   Osteopenia 05/27/2015   Advanced care planning/counseling discussion 05/28/2014   Abnormal TSH 05/28/2014   Rhinorrhea 01/09/2014   COPD, moderate ([place]) 02/12/2013   HTN (hypertension)    GERD (gastroesophageal reflux disease)    Barrett esophagus 02/24/2012   Medicare annual wellness visit, subsequent 12/24/2011   Controlled diabetes mellitus type 2 with complications ([place])    Hyperlipidemia associated with type 2 diabetes mellitus ([place])    Smoker    Recent Relevant Labs: Lab Results  Component Value Date/Time   HGBA1C 7.3 (H) 11/26/2019 09:30 AM   HGBA1C 7.3 (A) 06/05/2019 10:20 AM   HGBA1C 7.4 (A) 11/29/2018 11:30 AM   HGBA1C 7.2 (H) 06/08/2018 09:21 AM  MICROALBUR 90.7 (H) 11/26/2019 10:31 AM   MICROALBUR 313.7 (H) 06/08/2018 09:21 AM    Kidney Function Lab Results  Component Value Date/Time   CREATININE 1.31 (H) 03/09/2020 11:33 AM   CREATININE 1.27 (H) 12/31/2019 12:34 PM   CREATININE 1.02 05/07/2007 12:00 AM   GFR 40.14 (L) 03/09/2020 11:33 AM   GFRNONAA 33 (L) 08/25/2017 04:50 PM   GFRAA 38 (L) 08/25/2017 04:50 PM   Current antihyperglycemic regimen:  Metformin 1000 mg - 1 tablet twice daily   Januvia 50 mg - 1 tablet daily  What recent interventions/DTPs have been made to improve glycemic control:  Continue to check blood glucose and record readings.  Have there been any recent hospitalizations or ED visits since last visit with CPP? No   Patient denies hypoglycemic symptoms, including Pale, Sweaty, Shaky, Hungry, Nervous/irritable and Vision changes   Patient denies hyperglycemic symptoms, including blurry vision, excessive thirst, fatigue, polyuria and weakness   How often are you checking your blood sugar? Once or twice daily   What are your blood sugars ranging?   Fasting:  04/26/20 125 04/29/20 129  Before meals: N/A After meals: N/A Bedtime: 05/05/20 - 150  During the week, how often does your blood glucose drop below 70? Never   Are you checking your feet daily/regularly? Yes, denies any wounds or sores. States she has some swelling in the feet, left foot worse. She has contacted Dr. Bosie Clos office regarding this.   Adherence Review: Is the patient currently on a STATIN medication? Yes  Does the patient have >5 day gap between last estimated fill dates? No - refills timely  Reviewed chart prior to disease state call. Spoke with patient regarding BP  Recent Office Vitals: BP Readings from Last 3 Encounters:  03/16/20 130/60  12/31/19 130/64  11/20/19 (!) 142/57   Pulse Readings from Last 3 Encounters:  03/16/20 82  12/31/19 80  11/20/19 97    Wt Readings from Last 3 Encounters:  03/16/20 130 lb 12 oz (59.3 kg)  01/10/20 127 lb (57.6 kg)  12/31/19 127 lb (57.6 kg)     Kidney Function Lab Results  Component Value Date/Time   CREATININE 1.31 (H) 03/09/2020 11:33 AM   CREATININE 1.27 (H) 12/31/2019 12:34 PM   CREATININE 1.02 05/07/2007 12:00 AM   GFR 40.14 (L) 03/09/2020 11:33 AM   GFRNONAA 33 (L) 08/25/2017 04:50 PM   GFRAA 38 (L) 08/25/2017 04:50 PM    BMP Latest Ref Rng & Units 03/09/2020 12/31/2019 11/26/2019  Glucose 70 - 99 mg/dL 111(H)  113(H) 132(H)  BUN 6 - 23 mg/dL 18 21 20   Creatinine 0.40 - 1.20 mg/dL 1.31(H) 1.27(H) 1.52(H)  BUN/Creat Ratio 11 - 26 - - -  Sodium 135 - 145 mEq/L 139 138 138  Potassium 3.5 - 5.1 mEq/L 4.8 5.4 No hemolysis seen(H) 4.7  Chloride 96 - 112 mEq/L 104 104 106  CO2 19 - 32 mEq/L 26 27 23   Calcium 8.4 - 10.5 mg/dL 9.2 9.3 9.5    Current antihypertensive regimen:  Amlodipine 5 mg - 1 tablet daily  How often are you checking your Blood Pressure? infrequently   Current home BP readings: States she checks occasionally but does not have any readings. Unable to provide a range. Told her I will follow up in about 1 week for readings.  What recent interventions/DTPs have been made by any provider to improve Blood Pressure control since last CPP Visit: Monitor blood pressure at home. Lisinopril 2.5 mg - 1  tablet daily- Patient states Dr. Danise Mina told her to stop taking.  Any recent hospitalizations or ED visits since last visit with CPP? No   What diet changes have been made to improve Blood Pressure Control?  No changes to diet. States she does not salt her food at all.   What exercise is being done to improve your Blood Pressure Control?  States she is "gettting out more than she used to".   Adherence Review: Is the patient currently on ACE/ARB medication? No  Patient aware I will call 05/13/20 at 9:00 AM  to review current blood pressure and blood sugar readings. Patient states she will write them down when she checks them.   Follow-Up:  Pharmacist Review   [name], CPP notified  Margaretmary Dys, [place] Assistant [phone]  I have reviewed the care management and care coordination activities outlined in this encounter and I am certifying that I agree with the content of this note. No further action required.  [name], PharmD Clinical Pharmacist [facility] at [facility] [phone]

## 2020-05-13 ENCOUNTER — Telehealth: Payer: Self-pay

## 2020-05-13 NOTE — Chronic Care Management (AMB) (Addendum)
Chronic Care Management Pharmacy Assistant   Name: Cheryl Bird  MRN: [id number] DOB: [birth year]  Reason for Encounter: Disease State- Hypertension/Diabetes follow up   PCP : Cheryl Bush, MD  Allergies:   Allergies  Allergen Reactions   Ace Inhibitors Other (See Comments)    Hyperkalemia (even on 2.5mg  lisinopril), bump in creatinine   Aleve [Naproxen Sodium] Itching    Medications: Outpatient Encounter Medications as of 05/13/2020  Medication Sig   albuterol (VENTOLIN HFA) 108 (90 Base) MCG/ACT inhaler TAKE 2 PUFFS BY MOUTH EVERY 6 HOURS AS NEEDED FOR WHEEZE   amLODipine (NORVASC) 5 MG tablet TAKE 1 TABLET BY MOUTH EVERY DAY   aspirin EC 81 MG tablet Take 1 tablet (81 mg total) by mouth every other day.   atorvastatin (LIPITOR) 20 MG tablet TAKE 1 TABLET (20 MG TOTAL) BY MOUTH DAILY AT 6 PM.   Calcium Carbonate-Vitamin D (CALCIUM 600/VITAMIN D) 600-400 MG-UNIT chew tablet Chew 1 tablet by mouth daily.   D3-50 1.25 MG (50000 UT) capsule TAKE 1 CAPULE BY MOUTH ONE TIME PER WEEK   ipratropium (ATROVENT) 0.03 % nasal spray SMARTSIG:1-2 Spray(s) Both Nares 3 Times Daily PRN   JANUVIA 50 MG tablet TAKE 1 TABLET BY MOUTH EVERY DAY   metFORMIN (GLUCOPHAGE) 1000 MG tablet TAKE 1 TABLET (1,000 MG TOTAL) BY MOUTH 2 (TWO) TIMES DAILY WITH A MEAL.   omeprazole (PRILOSEC) 20 MG capsule Take by mouth.   oxybutynin (DITROPAN) 5 MG tablet TAKE 1 TABLET (5 MG TOTAL) BY MOUTH 2 (TWO) TIMES DAILY AS NEEDED (INCONTINENCE).   No facility-administered encounter medications on file as of 05/13/2020.    Current Diagnosis: Patient Active Problem List   Diagnosis Date Noted   Hearing loss 01/02/2020   Cirrhosis of liver ([place]) 01/20/2019   Subclavian artery stenosis, left (HCC) 12/22/2018   Vitamin D deficiency 11/29/2018   Bilateral serous otitis media 05/03/2018   Emphysema of lung ([place]) 12/27/2017   Right hand pain 10/15/2017   Hyperkalemia 09/19/2017   TIA (transient ischemic  attack) 08/29/2017   Heart murmur, systolic 54/00/8676   CKD stage 3 due to type 2 diabetes mellitus ([place]) 06/05/2017   CAD (coronary artery disease) 12/10/2016   Thoracic aortic atherosclerosis ([place]) 12/10/2016   Carotid stenosis 06/04/2016   Background diabetic retinopathy ([place]) 02/01/2016   Urinary incontinence, mixed 11/30/2015   Health maintenance examination 06/02/2015   Osteopenia 05/27/2015   Advanced care planning/counseling discussion 05/28/2014   Abnormal TSH 05/28/2014   Rhinorrhea 01/09/2014   COPD, moderate ([place]) 02/12/2013   HTN (hypertension)    GERD (gastroesophageal reflux disease)    Barrett esophagus 02/24/2012   Medicare annual wellness visit, subsequent 12/24/2011   Controlled diabetes mellitus type 2 with complications ([place])    Hyperlipidemia associated with type 2 diabetes mellitus ([place])    Smoker     Contacted Cheryl Bird for follow up on blood pressure and blood glucose readings.  Current antihypertensive regimen:  Amlodipine 5 mg - 1 tablet daily   Patient does not have any readings available, states her daughter has her blood pressure monitor and she is working on getting it back from her.   Current antihyperglycemic regimen:  Metformin 1000 mg - 1 tablet twice daily  Januvia 50 mg - 1 tablet daily  Patient has 2 readings available: 05/15/20- 107 Fasting 05/14/20- 122 bedtime  Discussed with patient about trying to get more blood sugar and blood pressure readings. Will follow up with her on 05/22/20 to review  any updated readings.   Follow-Up:  Pharmacist Review   Cheryl Bird, CPP notified  Cheryl Bird, [place] Assistant [phone]   I have reviewed the care management and care coordination activities outlined in this encounter and I am certifying that I agree with the content of this note. No further action required.  Cheryl Bird, PharmD Clinical Pharmacist [facility] at [facility] [phone]

## 2020-05-22 ENCOUNTER — Telehealth: Payer: Self-pay

## 2020-05-22 NOTE — Chronic Care Management (AMB) (Addendum)
    Chronic Care Management Pharmacy Assistant   Name: Cheryl Bird  MRN: [id number] DOB: Sep 20, 1945  Reason for Encounter: Blood Glucose/Blood Pressure Log  PCP : Ria Bush, MD  Allergies:   Allergies  Allergen Reactions   Ace Inhibitors Other (See Comments)    Hyperkalemia (even on 2.5mg  lisinopril), bump in creatinine   Aleve [Naproxen Sodium] Itching    Medications: Outpatient Encounter Medications as of 05/22/2020  Medication Sig   albuterol (VENTOLIN HFA) 108 (90 Base) MCG/ACT inhaler TAKE 2 PUFFS BY MOUTH EVERY 6 HOURS AS NEEDED FOR WHEEZE   amLODipine (NORVASC) 5 MG tablet TAKE 1 TABLET BY MOUTH EVERY DAY   aspirin EC 81 MG tablet Take 1 tablet (81 mg total) by mouth every other day.   atorvastatin (LIPITOR) 20 MG tablet TAKE 1 TABLET (20 MG TOTAL) BY MOUTH DAILY AT 6 PM.   Calcium Carbonate-Vitamin D (CALCIUM 600/VITAMIN D) 600-400 MG-UNIT chew tablet Chew 1 tablet by mouth daily.   D3-50 1.25 MG (50000 UT) capsule TAKE 1 CAPULE BY MOUTH ONE TIME PER WEEK   ipratropium (ATROVENT) 0.03 % nasal spray SMARTSIG:1-2 Spray(s) Both Nares 3 Times Daily PRN   JANUVIA 50 MG tablet TAKE 1 TABLET BY MOUTH EVERY DAY   metFORMIN (GLUCOPHAGE) 1000 MG tablet TAKE 1 TABLET (1,000 MG TOTAL) BY MOUTH 2 (TWO) TIMES DAILY WITH A MEAL.   omeprazole (PRILOSEC) 20 MG capsule Take by mouth.   oxybutynin (DITROPAN) 5 MG tablet TAKE 1 TABLET (5 MG TOTAL) BY MOUTH 2 (TWO) TIMES DAILY AS NEEDED (INCONTINENCE).   No facility-administered encounter medications on file as of 05/22/2020.    Current Diagnosis: Patient Active Problem List   Diagnosis Date Noted   Hearing loss 01/02/2020   Cirrhosis of liver ([place]) 01/20/2019   Subclavian artery stenosis, left (HCC) 12/22/2018   Vitamin D deficiency 11/29/2018   Bilateral serous otitis media 05/03/2018   Emphysema of lung ([place]) 12/27/2017   Right hand pain 10/15/2017   Hyperkalemia 09/19/2017   TIA (transient ischemic attack) 08/29/2017    Heart murmur, systolic 81/82/9937   CKD stage 3 due to type 2 diabetes mellitus ([place]) 06/05/2017   CAD (coronary artery disease) 12/10/2016   Thoracic aortic atherosclerosis ([place]) 12/10/2016   Carotid stenosis 06/04/2016   Background diabetic retinopathy ([place]) 02/01/2016   Urinary incontinence, mixed 11/30/2015   Health maintenance examination 06/02/2015   Osteopenia 05/27/2015   Advanced care planning/counseling discussion 05/28/2014   Abnormal TSH 05/28/2014   Rhinorrhea 01/09/2014   COPD, moderate ([place]) 02/12/2013   HTN (hypertension)    GERD (gastroesophageal reflux disease)    Barrett esophagus 02/24/2012   Medicare annual wellness visit, subsequent 12/24/2011   Controlled diabetes mellitus type 2 with complications ([place])    Hyperlipidemia associated with type 2 diabetes mellitus ([place])    Smoker    Contacted Ms. Stanislawski for follow up on blood pressure and blood sugars. She only has 1 reading of each. Blood pressure 05/21/20- 103/64, blood sugar 05/22/20 fasting - 99  Follow-Up:  Pharmacist Review   [name], CPP notified  Margaretmary Dys, [place] Pharmacy Assistant [phone]  I have reviewed the care management and care coordination activities outlined in this encounter and I am certifying that I agree with the content of this note. No further action required.  [name], PharmD Clinical Pharmacist [facility] at [facility] [phone]

## 2020-05-28 ENCOUNTER — Other Ambulatory Visit: Payer: Self-pay | Admitting: Family Medicine

## 2020-05-28 DIAGNOSIS — Z1231 Encounter for screening mammogram for malignant neoplasm of breast: Secondary | ICD-10-CM

## 2020-06-22 ENCOUNTER — Other Ambulatory Visit: Payer: Self-pay

## 2020-06-22 ENCOUNTER — Ambulatory Visit
Admission: RE | Admit: 2020-06-22 | Discharge: 2020-06-22 | Disposition: A | Discharge status: Home / Self Care | Payer: Medicare Other | Source: Ambulatory Visit | Attending: Family Medicine | Admitting: Family Medicine

## 2020-06-22 DIAGNOSIS — Z1231 Encounter for screening mammogram for malignant neoplasm of breast: Secondary | ICD-10-CM | POA: Diagnosis not present

## 2020-06-29 ENCOUNTER — Encounter: Payer: Self-pay | Admitting: Family Medicine

## 2020-06-29 ENCOUNTER — Other Ambulatory Visit: Payer: Self-pay

## 2020-06-29 ENCOUNTER — Ambulatory Visit (INDEPENDENT_AMBULATORY_CARE_PROVIDER_SITE_OTHER): Payer: Medicare Other | Admitting: Family Medicine

## 2020-06-29 VITALS — BP 122/60 | HR 88 | Temp 98.1°F | Ht 61.0 in | Wt 128.2 lb

## 2020-06-29 DIAGNOSIS — E118 Type 2 diabetes mellitus with unspecified complications: Secondary | ICD-10-CM | POA: Diagnosis not present

## 2020-06-29 DIAGNOSIS — H9193 Unspecified hearing loss, bilateral: Secondary | ICD-10-CM

## 2020-06-29 DIAGNOSIS — J449 Chronic obstructive pulmonary disease, unspecified: Secondary | ICD-10-CM

## 2020-06-29 DIAGNOSIS — R600 Localized edema: Secondary | ICD-10-CM | POA: Diagnosis not present

## 2020-06-29 DIAGNOSIS — N183 Chronic kidney disease, stage 3 unspecified: Secondary | ICD-10-CM

## 2020-06-29 DIAGNOSIS — K7460 Unspecified cirrhosis of liver: Secondary | ICD-10-CM

## 2020-06-29 DIAGNOSIS — I10 Essential (primary) hypertension: Secondary | ICD-10-CM

## 2020-06-29 DIAGNOSIS — K2270 Barrett's esophagus without dysplasia: Secondary | ICD-10-CM

## 2020-06-29 DIAGNOSIS — E1122 Type 2 diabetes mellitus with diabetic chronic kidney disease: Secondary | ICD-10-CM | POA: Diagnosis not present

## 2020-06-29 LAB — RENAL FUNCTION PANEL
Albumin: 3.4 g/dL — ABNORMAL LOW (ref 3.5–5.2)
BUN: 24 mg/dL — ABNORMAL HIGH (ref 6–23)
CO2: 25 meq/L (ref 19–32)
Calcium: 9.3 mg/dL (ref 8.4–10.5)
Chloride: 107 meq/L (ref 96–112)
Creatinine, Ser: 1.29 mg/dL — ABNORMAL HIGH (ref 0.40–1.20)
GFR: 40.8 mL/min — ABNORMAL LOW
Glucose, Bld: 119 mg/dL — ABNORMAL HIGH (ref 70–99)
Phosphorus: 3.7 mg/dL (ref 2.3–4.6)
Potassium: 5 meq/L (ref 3.5–5.1)
Sodium: 139 meq/L (ref 135–145)

## 2020-06-29 LAB — POCT GLYCOSYLATED HEMOGLOBIN (HGB A1C): Hemoglobin A1C: 5.8 % — AB (ref 4.0–5.6)

## 2020-06-29 MED ORDER — DAPAGLIFLOZIN PROPANEDIOL 5 MG PO TABS: 5.0000 mg | ORAL_TABLET | Freq: Every day | ORAL | 3 refills | Status: DC

## 2020-06-29 MED ORDER — METFORMIN HCL 1000 MG PO TABS: 500.0000 mg | ORAL_TABLET | Freq: Two times a day (BID) | ORAL | 3 refills | Status: AC

## 2020-06-29 NOTE — Progress Notes (Signed)
Patient ID: Cheryl Bird, female    DOB: 1945-05-06, 75 y.o.   MRN: [id number]  This visit was conducted in person.  BP 122/60   Pulse 88   Temp 98.1 F (36.7 C) (Temporal)   Ht 5\' 1"  (1.549 m)   Wt 128 lb 3 oz (58.1 kg)   SpO2 92%   BMI 24.22 kg/m    CC: 6 mo f/u visit  Subjective:   HPI: Cheryl Bird is a 75 y.o. female presenting on 06/29/2020 for Follow-up (Here for 6 mo f/u.) and Form Completion (Requests [place] Disability Parking Placard. )   Sister recently diagnosed with pancreatic cancer. Ex husband also recently died from pancreatic cancer.   Requests handicap placard form today for difficultly walking due to h/o COPD (FEV1 1.4L), difficulty walking 200 ft without stopping to rest.   HTN - lisinopril caused hyperkalemia.   DM - does regularly check sugars - alternates fasting with night time cbg - reading this morning 109. Compliant with antihyperglycemic regimen which includes: [place] 50mg  daily, metformin 1000mg  bid. Denies low sugars or hypoglycemic symptoms. Denies paresthesias. Last diabetic eye exam DUE. Pneumovax: 2013. Prevnar: 2015. Glucometer brand: thinks it's one touch. DSME: completed at [facility] per patient. Lab Results  Component Value Date   HGBA1C 5.8 (A) 06/29/2020   Diabetic Foot Exam - Simple   Simple Foot Form Diabetic Foot exam was performed with the following findings: Yes 06/29/2020 12:07 PM  Visual Inspection No deformities, no ulcerations, no other skin breakdown bilaterally: Yes Sensation Testing Intact to touch and monofilament testing bilaterally: Yes Pulse Check See comments: Yes Comments 1+ DP bilaterally Sensation intact to monofilament testing    Lab Results  Component Value Date   MICROALBUR 90.7 (H) 11/26/2019    Known Barrett's esophagus followed by [facility] last EGD 07/2019 - continues omeprazole 20mg  daily. Suspected early cirrhosis - liver panel returned normal. rec see GI Q6 mo, last seen 01/2020. Notes increasing  pedal edema over the past month.  Colonoscopy WNL 10/2019.      Relevant past medical, surgical, family and social history reviewed and updated as indicated. Interim medical history since our last visit reviewed. Allergies and medications reviewed and updated. Outpatient Medications Prior to Visit  Medication Sig Dispense Refill  . albuterol (VENTOLIN HFA) 108 (90 Base) MCG/ACT inhaler TAKE 2 PUFFS BY MOUTH EVERY 6 HOURS AS NEEDED FOR WHEEZE 18 g 6  . aspirin EC 81 MG tablet Take 1 tablet (81 mg total) by mouth every other day.    Marland Kitchen atorvastatin (LIPITOR) 20 MG tablet TAKE 1 TABLET (20 MG TOTAL) BY MOUTH DAILY AT 6 PM. 90 tablet 3  . Calcium Carbonate-Vitamin D (CALCIUM 600/VITAMIN D) 600-400 MG-UNIT chew tablet Chew 1 tablet by mouth daily.    . D3-50 1.25 MG (50000 UT) capsule TAKE 1 CAPULE BY MOUTH ONE TIME PER WEEK 12 capsule 0  . ipratropium (ATROVENT) 0.03 % nasal spray SMARTSIG:1-2 Spray(s) Both Nares 3 Times Daily PRN    . JANUVIA 50 MG tablet TAKE 1 TABLET BY MOUTH EVERY DAY 90 tablet 3  . omeprazole (PRILOSEC) 20 MG capsule Take by mouth.    . oxybutynin (DITROPAN) 5 MG tablet TAKE 1 TABLET (5 MG TOTAL) BY MOUTH 2 (TWO) TIMES DAILY AS NEEDED (INCONTINENCE). 180 tablet 1  . amLODipine (NORVASC) 5 MG tablet TAKE 1 TABLET BY MOUTH EVERY DAY 90 tablet 3  . metFORMIN (GLUCOPHAGE) 1000 MG tablet TAKE 1 TABLET (1,000 MG TOTAL)  BY MOUTH 2 (TWO) TIMES DAILY WITH A MEAL. 180 tablet 3  . amLODipine (NORVASC) 5 MG tablet Take 0.5 tablets (2.5 mg total) by mouth daily.     No facility-administered medications prior to visit.     Per HPI unless specifically indicated in ROS section below Review of Systems Objective:  BP 122/60   Pulse 88   Temp 98.1 F (36.7 C) (Temporal)   Ht 5\' 1"  (1.549 m)   Wt 128 lb 3 oz (58.1 kg)   SpO2 92%   BMI 24.22 kg/m   Wt Readings from Last 3 Encounters:  06/29/20 128 lb 3 oz (58.1 kg)  03/16/20 130 lb 12 oz (59.3 kg)  01/10/20 127 lb (57.6 kg)       Physical Exam Vitals and nursing note reviewed.  Constitutional:      General: She is not in acute distress.    Appearance: Normal appearance. She is well-developed and well-nourished. She is not ill-appearing.  HENT:     Mouth/Throat:     Mouth: Oropharynx is clear and moist.  Eyes:     General: No scleral icterus.    Extraocular Movements: Extraocular movements intact and EOM normal.     Conjunctiva/sclera: Conjunctivae normal.     Pupils: Pupils are equal, round, and reactive to light.  Cardiovascular:     Rate and Rhythm: Normal rate and regular rhythm.     Pulses: Normal pulses and intact distal pulses.     Heart sounds: Murmur heard.    Pulmonary:     Effort: Pulmonary effort is normal. No respiratory distress.     Breath sounds: Normal breath sounds. No wheezing, rhonchi or rales.     Comments: No crackles bilaterally Musculoskeletal:        General: Swelling present.     Cervical back: Normal range of motion and neck supple.     Right lower leg: Edema (1-2+) present.     Left lower leg: Edema (1-2+) present.     Comments: See HPI for foot exam if done  Lymphadenopathy:     Cervical: No cervical adenopathy.  Skin:    General: Skin is warm and dry.     Findings: No rash.  Neurological:     Mental Status: She is alert.  Psychiatric:        Mood and Affect: Mood and affect and mood normal.        Behavior: Behavior normal.       Results for orders placed or performed in visit on 06/29/20  POCT glycosylated hemoglobin (Hb A1C)  Result Value Ref Range   Hemoglobin A1C 5.8 (A) 4.0 - 5.6 %   HbA1c POC (<> result, manual entry)     HbA1c, POC (prediabetic range)     HbA1c, POC (controlled diabetic range)     Assessment & Plan:  This visit occurred during the SARS-CoV-2 public health emergency.  Safety protocols were in place, including screening questions prior to the visit, additional usage of staff PPE, and extensive cleaning of exam room while observing  appropriate contact time as indicated for disinfecting solutions.   Problem List Items Addressed This Visit    Pedal edema    Progressive, significant on exam today. ?cirrhosis related.  No bibasilar crackles heard.  Rx farxiga 10mg  daily, drop amlodipine dose to 2.5mg  daily.  Given multiple medication changes today, RTC 1 mo f/u visit. Pt agrees with plan.       HTN (hypertension)    BP well  controlled on current regimen.  Stay off ACEI/ARB due to hyperkalemia on multiple trials.  For pedal edema - will drop amlodipine to 2.5mg  daily and add farxiga for diuretic effect.       Relevant Medications   amLODipine (NORVASC) 5 MG tablet   Hearing loss    New hearing aides working well       COPD, moderate ([place])    Handicap placard filled out for difficulty walking 200 feet without stopping to rest.  ?Asthma given good response to bronchodilator on latest spirometry 2014.       Controlled diabetes mellitus type 2 with complications (HCC) - Primary    Chronic, stable on current regimen. Will start dropping metformin does given good control. Will price out farxiga in place of [place] given progressive pedal edema.       Relevant Medications   dapagliflozin propanediol (FARXIGA) 5 MG TABS tablet   metFORMIN (GLUCOPHAGE) 1000 MG tablet   Other Relevant Orders   POCT glycosylated hemoglobin (Hb A1C) (Completed)   Renal function panel   CKD stage 3 due to type 2 diabetes mellitus ([place])    Progressive. Anticipate diabetes related. Does not tolerate even low dose ACEI due to hyperkalemia.  Will start farxiga cautiously given CKD.  Drop metformin dose to 500mg  BID.  Update renal panel today.       Relevant Medications   dapagliflozin propanediol (FARXIGA) 5 MG TABS tablet   metFORMIN (GLUCOPHAGE) 1000 MG tablet   Cirrhosis of liver (HCC)    Followed Q46mo by [facility]. Appreciate their care.      Barrett esophagus    Now followed by [facility] with latest EGD 2021 showing  persistent Barrett's. Continue daily omeprazole 20mg .           Meds ordered this encounter  Medications  . dapagliflozin propanediol (FARXIGA) 5 MG TABS tablet    Sig: Take 1 tablet (5 mg total) by mouth daily before breakfast.    Dispense:  30 tablet    Refill:  3  . metFORMIN (GLUCOPHAGE) 1000 MG tablet    Sig: Take 0.5 tablets (500 mg total) by mouth 2 (two) times daily with a meal.    Dispense:  90 tablet    Refill:  3    Note new sig   Orders Placed This Encounter  Procedures  . Renal function panel  . POCT glycosylated hemoglobin (Hb A1C)    Patient Instructions  Labs today  You will be due to see GI again in 07/2020.  Drop metformin dose to 500mg  twice daily (1/2 tablet) Price out farxiga (dapagliflozin) 10mg  daily. This medicine makes you urinate more sugar - so watch for UTI, yeast infection, groin infection. If affordable, use this in place of [place].  Drop amlodipine dose to 2.5mg  daily (1/2 tablet).  Return in 1 month for follow up visit on today's changes    Follow up plan: Return in about 4 weeks (around 07/27/2020), or if symptoms worsen or fail to improve, for follow up visit.  Ria Bush, MD

## 2020-06-29 NOTE — Patient Instructions (Addendum)
Labs today  You will be due to see GI again in 07/2020.  Drop metformin dose to 500mg  twice daily (1/2 tablet) Price out farxiga (dapagliflozin) 10mg  daily. This medicine makes you urinate more sugar - so watch for UTI, yeast infection, groin infection. If affordable, use this in place of [place].  Drop amlodipine dose to 2.5mg  daily (1/2 tablet).  Return in 1 month for follow up visit on today's changes

## 2020-06-29 NOTE — Assessment & Plan Note (Signed)
Followed Q76mo by [facility]. Appreciate their care.

## 2020-06-29 NOTE — Assessment & Plan Note (Signed)
Progressive, significant on exam today. ?cirrhosis related.  No bibasilar crackles heard.  Rx farxiga 10mg  daily, drop amlodipine dose to 2.5mg  daily.  Given multiple medication changes today, RTC 1 mo f/u visit. Pt agrees with plan.

## 2020-06-29 NOTE — Assessment & Plan Note (Signed)
Handicap placard filled out for difficulty walking 200 feet without stopping to rest.  ?Asthma given good response to bronchodilator on latest spirometry 2014.

## 2020-06-29 NOTE — Assessment & Plan Note (Addendum)
BP well controlled on current regimen.  Stay off ACEI/ARB due to hyperkalemia on multiple trials.  For pedal edema - will drop amlodipine to 2.5mg  daily and add farxiga for diuretic effect.

## 2020-06-29 NOTE — Assessment & Plan Note (Addendum)
Progressive. Anticipate diabetes related. Does not tolerate even low dose ACEI due to hyperkalemia.  Will start farxiga cautiously given CKD.  Drop metformin dose to 500mg  BID.  Update renal panel today.

## 2020-06-29 NOTE — Assessment & Plan Note (Addendum)
Chronic, stable on current regimen. Will start dropping metformin does given good control. Will price out farxiga in place of [place] given progressive pedal edema.

## 2020-06-29 NOTE — Assessment & Plan Note (Signed)
New hearing aides working well

## 2020-06-29 NOTE — Assessment & Plan Note (Signed)
Now followed by [facility] with latest EGD 2021 showing persistent Barrett's. Continue daily omeprazole 20mg .

## 2020-07-31 ENCOUNTER — Encounter: Payer: Self-pay | Admitting: Family Medicine

## 2020-07-31 ENCOUNTER — Ambulatory Visit (INDEPENDENT_AMBULATORY_CARE_PROVIDER_SITE_OTHER): Payer: Medicare Other | Admitting: Family Medicine

## 2020-07-31 ENCOUNTER — Other Ambulatory Visit: Payer: Self-pay

## 2020-07-31 VITALS — BP 136/86 | HR 91 | Temp 97.7°F | Ht 61.0 in | Wt 126.2 lb

## 2020-07-31 DIAGNOSIS — E113299 Type 2 diabetes mellitus with mild nonproliferative diabetic retinopathy without macular edema, unspecified eye: Secondary | ICD-10-CM | POA: Diagnosis not present

## 2020-07-31 DIAGNOSIS — R600 Localized edema: Secondary | ICD-10-CM | POA: Diagnosis not present

## 2020-07-31 DIAGNOSIS — E118 Type 2 diabetes mellitus with unspecified complications: Secondary | ICD-10-CM

## 2020-07-31 DIAGNOSIS — R7989 Other specified abnormal findings of blood chemistry: Secondary | ICD-10-CM

## 2020-07-31 DIAGNOSIS — I10 Essential (primary) hypertension: Secondary | ICD-10-CM | POA: Diagnosis not present

## 2020-07-31 DIAGNOSIS — N183 Chronic kidney disease, stage 3 unspecified: Secondary | ICD-10-CM

## 2020-07-31 DIAGNOSIS — K7460 Unspecified cirrhosis of liver: Secondary | ICD-10-CM | POA: Diagnosis not present

## 2020-07-31 DIAGNOSIS — E1122 Type 2 diabetes mellitus with diabetic chronic kidney disease: Secondary | ICD-10-CM

## 2020-07-31 LAB — RENAL FUNCTION PANEL
Albumin: 3.5 g/dL (ref 3.5–5.2)
BUN: 19 mg/dL (ref 6–23)
CO2: 27 mEq/L (ref 19–32)
Calcium: 9.1 mg/dL (ref 8.4–10.5)
Chloride: 103 mEq/L (ref 96–112)
Creatinine, Ser: 1.34 mg/dL — ABNORMAL HIGH (ref 0.40–1.20)
GFR: 38.95 mL/min — ABNORMAL LOW (ref >60.00)
Glucose, Bld: 153 mg/dL — ABNORMAL HIGH (ref 70–99)
Phosphorus: 4.7 mg/dL — ABNORMAL HIGH (ref 2.3–4.6)
Potassium: 5.4 mEq/L — ABNORMAL HIGH (ref 3.5–5.1)
Sodium: 135 mEq/L (ref 135–145)

## 2020-07-31 LAB — T4, FREE: Free T4: 0.61 ng/dL (ref 0.60–1.60)

## 2020-07-31 LAB — TSH: TSH: 4.1 u[IU]/mL (ref 0.35–4.50)

## 2020-07-31 NOTE — Assessment & Plan Note (Signed)
Appreciate GI care - waiting to be contacted by their office to schedule f/u.

## 2020-07-31 NOTE — Assessment & Plan Note (Signed)
Presumed cirrhosis related - farxiga likely helped some but notes ongoing trouble with dependent pedal edema. Will start compression stockings - she has copper fit socks at home she will try and let me know if would like to try Rx compression stockings.  Discussed next step likely lasix commencement if remaining edematous despite above.

## 2020-07-31 NOTE — Progress Notes (Signed)
Patient ID: Cheryl Bird, female    DOB: 1946/03/13, 75 y.o.   MRN: [id number]  This visit was conducted in person.  BP 136/86   Pulse 91   Temp 97.7 F (36.5 C) (Temporal)   Ht 5\' 1"  (1.549 m)   Wt 126 lb 4 oz (57.3 kg)   SpO2 95%   BMI 23.85 kg/m    CC: F/u pedal edema  Subjective:   HPI: Cheryl Bird is a 75 y.o. female presenting on 07/31/2020 for Follow-up (Here for 1 mo pedal edema f/u.)   See prior note for details.  Suspected early cirrhosis - liver panel returned normal. Seeing GI Q6 mo. Last visit we noted increasing pedal edema. Hasn't received notice from [facility] about f/u appt yet.  Last visit amlodipine dose dropped to 2.5mg , started farxiga 5mg  daily, metformin dose dropped and [place] stopped.    Here for 1 mo f/u visit - notes ongoing dependent leg swelling, goes away at night.  Tolerating farxiga well - no recent UTI or yeast infection symptoms.  Denies dyspnea, chest tightness, cough, orthostatic dizziness. No epigastric abdominal pain.  Remains off ACEI/ARB due to easy hyperkalemia.  Still has not received handicap placard.      Relevant past medical, surgical, family and social history reviewed and updated as indicated. Interim medical history since our last visit reviewed. Allergies and medications reviewed and updated. Outpatient Medications Prior to Visit  Medication Sig Dispense Refill  . albuterol (VENTOLIN HFA) 108 (90 Base) MCG/ACT inhaler TAKE 2 PUFFS BY MOUTH EVERY 6 HOURS AS NEEDED FOR WHEEZE 18 g 6  . amLODipine (NORVASC) 5 MG tablet Take 0.5 tablets (2.5 mg total) by mouth daily.    Marland Kitchen aspirin EC 81 MG tablet Take 1 tablet (81 mg total) by mouth every other day.    Marland Kitchen atorvastatin (LIPITOR) 20 MG tablet TAKE 1 TABLET (20 MG TOTAL) BY MOUTH DAILY AT 6 PM. 90 tablet 3  . Calcium Carbonate-Vitamin D (CALCIUM 600/VITAMIN D) 600-400 MG-UNIT chew tablet Chew 1 tablet by mouth daily.    . D3-50 1.25 MG (50000 UT) capsule TAKE 1 CAPULE BY  MOUTH ONE TIME PER WEEK 12 capsule 0  . dapagliflozin propanediol (FARXIGA) 5 MG TABS tablet Take 1 tablet (5 mg total) by mouth daily before breakfast. 30 tablet 3  . ipratropium (ATROVENT) 0.03 % nasal spray SMARTSIG:1-2 Spray(s) Both Nares 3 Times Daily PRN    . metFORMIN (GLUCOPHAGE) 1000 MG tablet Take 0.5 tablets (500 mg total) by mouth 2 (two) times daily with a meal. 90 tablet 3  . omeprazole (PRILOSEC) 20 MG capsule Take by mouth.    . oxybutynin (DITROPAN) 5 MG tablet TAKE 1 TABLET (5 MG TOTAL) BY MOUTH 2 (TWO) TIMES DAILY AS NEEDED (INCONTINENCE). 180 tablet 1  . JANUVIA 50 MG tablet TAKE 1 TABLET BY MOUTH EVERY DAY 90 tablet 3   No facility-administered medications prior to visit.     Per HPI unless specifically indicated in ROS section below Review of Systems Objective:  BP 136/86   Pulse 91   Temp 97.7 F (36.5 C) (Temporal)   Ht 5\' 1"  (1.549 m)   Wt 126 lb 4 oz (57.3 kg)   SpO2 95%   BMI 23.85 kg/m   Wt Readings from Last 3 Encounters:  07/31/20 126 lb 4 oz (57.3 kg)  06/29/20 128 lb 3 oz (58.1 kg)  03/16/20 130 lb 12 oz (59.3 kg)  Physical Exam Vitals and nursing note reviewed.  Constitutional:      Appearance: Normal appearance. She is not ill-appearing.  Cardiovascular:     Rate and Rhythm: Normal rate and regular rhythm.     Pulses: Normal pulses.     Heart sounds: Normal heart sounds. No murmur heard.   Pulmonary:     Effort: Pulmonary effort is normal. No respiratory distress.     Breath sounds: Normal breath sounds. No wheezing, rhonchi or rales.  Musculoskeletal:        General: Swelling present. No tenderness.     Right lower leg: Edema (1+) present.     Left lower leg: Edema (1+) present.     Comments: 1+ DP bilaterally  Skin:    General: Skin is warm and dry.     Findings: No rash.  Neurological:     Mental Status: She is alert.  Psychiatric:        Mood and Affect: Mood normal.        Behavior: Behavior normal.       Results for  orders placed or performed in visit on 06/29/20  Renal function panel  Result Value Ref Range   Sodium 139 135 - 145 mEq/L   Potassium 5.0 3.5 - 5.1 mEq/L   Chloride 107 96 - 112 mEq/L   CO2 25 19 - 32 mEq/L   Albumin 3.4 (L) 3.5 - 5.2 g/dL   BUN 24 (H) 6 - 23 mg/dL   Creatinine, Ser 1.29 (H) 0.40 - 1.20 mg/dL   Glucose, Bld 119 (H) 70 - 99 mg/dL   Phosphorus 3.7 2.3 - 4.6 mg/dL   GFR 40.80 (L) >60.00 mL/min   Calcium 9.3 8.4 - 10.5 mg/dL  POCT glycosylated hemoglobin (Hb A1C)  Result Value Ref Range   Hemoglobin A1C 5.8 (A) 4.0 - 5.6 %   HbA1c POC (<> result, manual entry)     HbA1c, POC (prediabetic range)     HbA1c, POC (controlled diabetic range)     Assessment & Plan:  This visit occurred during the SARS-CoV-2 public health emergency.  Safety protocols were in place, including screening questions prior to the visit, additional usage of staff PPE, and extensive cleaning of exam room while observing appropriate contact time as indicated for disinfecting solutions.   Problem List Items Addressed This Visit    Controlled diabetes mellitus type 2 with complications ([place])    Doing well on 1/2 dose metformin with farxiga. No longer on januvia as she had episode of hypoglycemic symptoms when on both januvia and farxiga. Reassess at f/u visit.       HTN (hypertension)    BP well controlled on current regimen, however lower amlodipine didn't help with pedal edema.       Abnormal TSH    Update today.       Relevant Orders   T4, free   Background diabetic retinopathy ([place])   CKD stage 3 due to type 2 diabetes mellitus ([place])    Update renal panel since starting farxiga.       Cirrhosis of liver ([place])    Appreciate GI care - waiting to be contacted by their office to schedule f/u.       Pedal edema - Primary    Presumed cirrhosis related - farxiga likely helped some but notes ongoing trouble with dependent pedal edema. Will start compression stockings - she has copper fit  socks at home she will try and let me know if would  like to try Rx compression stockings.  Discussed next step likely lasix commencement if remaining edematous despite above.       Relevant Orders   Renal function panel   TSH       No orders of the defined types were placed in this encounter.  Orders Placed This Encounter  Procedures  . Renal function panel  . TSH  . T4, free    Patient Instructions  I think you're doing well. Continue current medicines, stay off januvia, continue 1/2 dose of metformin and amlodipine. Continue farxiga 5mg  daily.  Start using compression stockings - let us know if you'd like a prescription for some, but ok to start with copper fit ones at home.  If not helping, next step may be to add another water pill.    Follow up plan: Return in about 2 months (around 09/30/2020) for follow up visit.  Ria Bush, MD

## 2020-07-31 NOTE — Patient Instructions (Signed)
I think you're doing well. Continue current medicines, stay off januvia, continue 1/2 dose of metformin and amlodipine. Continue farxiga 5mg  daily.  Start using compression stockings - let us know if you'd like a prescription for some, but ok to start with copper fit ones at home.  If not helping, next step may be to add another water pill.

## 2020-07-31 NOTE — Assessment & Plan Note (Signed)
Update renal panel since starting farxiga.

## 2020-07-31 NOTE — Assessment & Plan Note (Signed)
BP well controlled on current regimen, however lower amlodipine didn't help with pedal edema.

## 2020-07-31 NOTE — Assessment & Plan Note (Signed)
Doing well on 1/2 dose metformin with farxiga. No longer on januvia as she had episode of hypoglycemic symptoms when on both januvia and farxiga. Reassess at f/u visit.

## 2020-07-31 NOTE — Assessment & Plan Note (Signed)
Update today.

## 2020-08-03 ENCOUNTER — Other Ambulatory Visit: Payer: Self-pay | Admitting: Family Medicine

## 2020-08-08 ENCOUNTER — Other Ambulatory Visit: Payer: Self-pay | Admitting: Family Medicine

## 2020-08-10 ENCOUNTER — Telehealth: Payer: Self-pay

## 2020-08-10 NOTE — Chronic Care Management (AMB) (Addendum)
    Chronic Care Management Pharmacy Assistant   Name: Cheryl Bird  MRN: [id number] DOB: 19-Apr-1946   Reason for Encounter: CCM Reminder Call   Recent office visits:  07/31/2020 - Dr. Danise Mina - Labs ordered  06/29/2020 - Dr.Gutierrez - Start Farxiga 5mg  daily before breakfast, will drop amlodipine to 2.5mg  daily.  Recent consult visits:  None since last CCM contact  Hospital visits:  None in previous 6 months  Medications: Outpatient Encounter Medications as of 08/10/2020  Medication Sig   albuterol (VENTOLIN HFA) 108 (90 Base) MCG/ACT inhaler TAKE 2 PUFFS BY MOUTH EVERY 6 HOURS AS NEEDED FOR WHEEZE   amLODipine (NORVASC) 5 MG tablet Take 0.5 tablets (2.5 mg total) by mouth daily.   aspirin EC 81 MG tablet Take 1 tablet (81 mg total) by mouth every other day.   atorvastatin (LIPITOR) 20 MG tablet TAKE 1 TABLET (20 MG TOTAL) BY MOUTH DAILY AT 6 PM.   Calcium Carbonate-Vitamin D (CALCIUM 600/VITAMIN D) 600-400 MG-UNIT chew tablet Chew 1 tablet by mouth daily.   D3-50 1.25 MG (50000 UT) capsule TAKE 1 CAPULE BY MOUTH ONE TIME PER WEEK   dapagliflozin propanediol (FARXIGA) 5 MG TABS tablet Take 1 tablet (5 mg total) by mouth daily before breakfast.   ipratropium (ATROVENT) 0.03 % nasal spray SMARTSIG:1-2 Spray(s) Both Nares 3 Times Daily PRN   metFORMIN (GLUCOPHAGE) 1000 MG tablet Take 0.5 tablets (500 mg total) by mouth 2 (two) times daily with a meal.   oxybutynin (DITROPAN) 5 MG tablet TAKE 1 TABLET (5 MG TOTAL) BY MOUTH 2 (TWO) TIMES DAILY AS NEEDED (INCONTINENCE).   No facility-administered encounter medications on file as of 08/10/2020.               Cheryl Bird was contacted to remind her of her upcoming telephone visit with [name] on 08/11/2020 at 8:30am.  she was reminded by voicemail to have all medications, supplements and any blood glucose and blood pressure readings available for review at appointment   Star Rating Drugs: Medication:  Last Fill: Day  Supply Atorvastatin 20mg . 08/08/2020 90ds  Farxiga 5 mg.  07/27/2020 30ds  Metformin 1000mg . 06/07/2020 90ds Januvia 50mg .             07/17/2020 90ds   (per Dr.G. Will price out farxiga in place of januvia given progressive pedal edema)  [name], CPP notified  Avel Sensor, [place] Assistant [phone]  I have reviewed the care management and care coordination activities outlined in this encounter and I am certifying that I agree with the content of this note. No further action required.  [name], PharmD Clinical Pharmacist [facility] [phone]

## 2020-08-11 ENCOUNTER — Ambulatory Visit (INDEPENDENT_AMBULATORY_CARE_PROVIDER_SITE_OTHER): Payer: Medicare Other

## 2020-08-11 ENCOUNTER — Other Ambulatory Visit: Payer: Self-pay

## 2020-08-11 DIAGNOSIS — E785 Hyperlipidemia, unspecified: Secondary | ICD-10-CM | POA: Diagnosis not present

## 2020-08-11 DIAGNOSIS — E118 Type 2 diabetes mellitus with unspecified complications: Secondary | ICD-10-CM | POA: Diagnosis not present

## 2020-08-11 DIAGNOSIS — E1169 Type 2 diabetes mellitus with other specified complication: Secondary | ICD-10-CM

## 2020-08-11 DIAGNOSIS — I10 Essential (primary) hypertension: Secondary | ICD-10-CM

## 2020-08-11 NOTE — Progress Notes (Signed)
Chronic Care Management Pharmacy Note  08/11/2020 Name:  Cheryl Bird MRN:  [id number] DOB:  Jan 11, 1946  Subjective: Cheryl Bird is an 75 y.o. year old female who is a primary patient of Ria Bush, MD.  The CCM team was consulted for assistance with disease management and care coordination needs.    Engaged with patient by telephone for follow up visit in response to provider referral for pharmacy case management and/or care coordination services.   Consent to Services:  The patient was given the following information about Chronic Care Management services today, agreed to services, and gave verbal consent: 1. CCM service includes personalized support from designated clinical staff supervised by the primary care provider, including individualized plan of care and coordination with other care providers 2. 24/7 contact phone numbers for assistance for urgent and routine care needs. 3. Service will only be billed when office clinical staff spend 20 minutes or more in a month to coordinate care. 4. Only one practitioner may furnish and bill the service in a calendar month. 5.The patient may stop CCM services at any time (effective at the end of the month) by phone call to the office staff. 6. The patient will be responsible for cost sharing (co-pay) of up to 20% of the service fee (after annual deductible is met). Patient agreed to services and consent obtained.  Patient Care Team: Ria Bush, MD as PCP - General (Family Medicine) Dingeldein, Remo Lipps, MD as Consulting Physician (Ophthalmology) Lollie Sails, MD (Inactive) as Consulting Physician (Gastroenterology) Ok Edwards, NP as Nurse Practitioner (Gastroenterology) [name], [facility] as Pharmacist (Pharmacist)  Recent office visits: 07/31/20 - PCP -  Here for 1 mo f/u visit - notes ongoing dependent leg swelling, goes away at night.  Suspected early cirrhosis, following with GI every 6  months.Tolerating farxiga well - no recent UTI or yeast infection symptoms.  Continue current medications 06/29/20 - PCP - Known Barrett's esophagus followed [facility] last EGD 07/2019 - continues omeprazole 78m daily. Pedal edema, Amlodipine dose dropped to 2.532m started farxiga 7m51maily. A1c 5.8%, CKD, metformin dose dropped and januvia stopped.   Recent consult visits: None in previous 6 months  Hospital visits: None in previous 6 months  Objective:  Lab Results  Component Value Date   CREATININE 1.34 (H) 07/31/2020   BUN 19 07/31/2020   GFR 38.95 (L) 07/31/2020   GFRNONAA 33 (L) 08/25/2017   GFRAA 38 (L) 08/25/2017   NA 135 07/31/2020   K 5.4 No hemolysis seen (H) 07/31/2020   CALCIUM 9.1 07/31/2020   CO2 27 07/31/2020   GLUCOSE 153 (H) 07/31/2020    Lab Results  Component Value Date/Time   HGBA1C 5.8 (A) 06/29/2020 11:08 AM   HGBA1C 7.3 (H) 11/26/2019 09:30 AM   HGBA1C 7.3 (A) 06/05/2019 10:20 AM   HGBA1C 7.2 (H) 06/08/2018 09:21 AM   GFR 38.95 (L) 07/31/2020 02:27 PM   GFR 40.80 (L) 06/29/2020 12:24 PM   MICROALBUR 90.7 (H) 11/26/2019 10:31 AM   MICROALBUR 313.7 (H) 06/08/2018 09:21 AM    Last diabetic Eye exam:  Lab Results  Component Value Date/Time   HMDIABEYEEXA Retinopathy (A) 06/24/2019 12:00 AM    Last diabetic Foot exam:  06/29/20 PCP  Lab Results  Component Value Date   CHOL 150 11/26/2019   HDL 71.60 11/26/2019   LDLCALC 61 11/26/2019   LDLDIRECT 71.0 11/25/2016   TRIG 88.0 11/26/2019   CHOLHDL 2 11/26/2019    Hepatic Function Latest  Ref Rng & Units 07/31/2020 06/29/2020 03/09/2020  Total Protein 6.0 - 8.3 g/dL - - -  Albumin 3.5 - 5.2 g/dL 3.5 3.4(L) 3.4(L)  AST 0 - 37 U/L - - -  ALT 0 - 35 U/L - - -  Alk Phosphatase 39 - 117 U/L - - -  Total Bilirubin 0.2 - 1.2 mg/dL - - -  Bilirubin, Direct 0.0 - 0.3 mg/dL - - -    Lab Results  Component Value Date/Time   TSH 4.10 07/31/2020 02:27 PM   TSH 4.07 11/26/2019 09:30 AM   FREET4 0.61  07/31/2020 02:27 PM   FREET4 0.74 11/26/2019 09:30 AM    CBC Latest Ref Rng & Units 11/26/2019 01/28/2019 06/08/2018  WBC 4.0 - 10.5 K/uL 7.8 8.4 8.7  Hemoglobin 12.0 - 15.0 g/dL 11.4(L) 11.8(L) 12.5  Hematocrit 36.0 - 46.0 % 35.6(L) 36.6 38.0  Platelets 150.0 - 400.0 K/uL 264.0 253.0 235.0    Lab Results  Component Value Date/Time   VD25OH 44.87 11/26/2019 09:30 AM   VD25OH 13.23 (L) 11/29/2018 12:24 PM    Clinical ASCVD: Yes  The 10-year ASCVD risk score Mikey Bussing DC Jr., et al., 2013) is: 48.5%   Values used to calculate the score:     Age: 20 years     Sex: Female     Is Non-Hispanic African American: No     Diabetic: Yes     Tobacco smoker: Yes     Systolic Blood Pressure: 729 mmHg     Is BP treated: Yes     HDL Cholesterol: 71.6 mg/dL     Total Cholesterol: 150 mg/dL    Depression screen [facility] 2/9 11/26/2019 06/08/2018 05/31/2017  Decreased Interest 0 0 0  Down, Depressed, Hopeless 0 0 0  PHQ - 2 Score 0 0 0  Altered sleeping 0 0 0  Tired, decreased energy 0 0 0  Change in appetite 0 0 0  Feeling bad or failure about yourself  0 0 0  Trouble concentrating 0 0 0  Moving slowly or fidgety/restless 0 0 0  Suicidal thoughts 0 0 0  PHQ-9 Score 0 0 0  Difficult doing work/chores Not difficult at all Not difficult at all Not difficult at all    Social History   Tobacco Use  Smoking Status Current Some Day Smoker  . Packs/day: 0.25  . Years: 56.00  . Pack years: 14.00  . Types: Cigarettes  . Start date: 04/25/1968  Smokeless Tobacco Never Used  Tobacco Comment   smokes 1 pack week currently   BP Readings from Last 3 Encounters:  07/31/20 136/86  06/29/20 122/60  03/16/20 130/60   Pulse Readings from Last 3 Encounters:  07/31/20 91  06/29/20 88  03/16/20 82   Wt Readings from Last 3 Encounters:  07/31/20 126 lb 4 oz (57.3 kg)  06/29/20 128 lb 3 oz (58.1 kg)  03/16/20 130 lb 12 oz (59.3 kg)   BMI Readings from Last 3 Encounters:  07/31/20 23.85 kg/m  06/29/20  24.22 kg/m  03/16/20 24.70 kg/m    Assessment/Interventions: Review of patient past medical history, allergies, medications, health status, including review of consultants reports, laboratory and other test data, was performed as part of comprehensive evaluation and provision of chronic care management services.   SDOH:  (Social Determinants of Health) assessments and interventions performed: Yes SDOH Interventions   Flowsheet Row Most Recent Value  SDOH Interventions   Financial Strain Interventions Intervention Not Indicated  [Meds affordable]  SDOH Screenings   Alcohol Screen: Low Risk   . Last Alcohol Screening Score (AUDIT): 0  Depression (PHQ2-9): Low Risk   . PHQ-2 Score: 0  Financial Resource Strain: Low Risk   . Difficulty of Paying Living Expenses: Not very hard  Food Insecurity: No Food Insecurity  . Worried About Charity fundraiser in the Last Year: Never true  . Ran Out of Food in the Last Year: Never true  Housing: Low Risk   . Last Housing Risk Score: 0  Physical Activity: Sufficiently Active  . Days of Exercise per Week: 7 days  . Minutes of Exercise per Session: 30 min  Social Connections: Not on file  Stress: No Stress Concern Present  . Feeling of Stress : Not at all  Tobacco Use: High Risk  . Smoking Tobacco Use: Current Some Day Smoker  . Smokeless Tobacco Use: Never Used  Transportation Needs: No Transportation Needs  . Lack of Transportation (Medical): No  . Lack of Transportation (Non-Medical): No    CCM Care Plan  Allergies  Allergen Reactions  . Ace Inhibitors Other (See Comments)    Hyperkalemia (even on 2.35m lisinopril), bump in creatinine  . Aleve [Naproxen Sodium] Itching    Medications Reviewed Today    Reviewed by [facility]a [facility](Pharmacist) on 08/11/20 at 1524  Med List Status: <None>  Medication Order Taking? Sig Documenting Provider Last Dose Status Informant  albuterol (VENTOLIN HFA) 108 (90 Base) MCG/ACT inhaler  [id number] TAKE 2 PUFFS BY MOUTH EVERY 6 HOURS AS NEEDED FOR WHEEZE GRia Bush MD Taking Active   amLODipine (NORVASC) 5 MG tablet [id number] Take 0.5 tablets (2.5 mg total) by mouth daily. GRia Bush MD Taking Active   aspirin EC 81 MG tablet [id number] Take 1 tablet (81 mg total) by mouth every other day. GRia Bush MD Taking Active   atorvastatin (LIPITOR) 20 MG tablet [id number] TAKE 1 TABLET (20 MG TOTAL) BY MOUTH DAILY AT 6 PM. GRia Bush MD Taking Active   D3-50 1.25 MG (50000 UT) capsule [id number] TAKE 1 CAPULE BY MOUTH ONE TIME PER WEEK GRia Bush MD Taking Active   dapagliflozin propanediol (FARXIGA) 5 MG TABS tablet [id number] Take 1 tablet (5 mg total) by mouth daily before breakfast. GRia Bush MD Taking Active   ipratropium (ATROVENT) 0.03 % nasal spray [id number] SMARTSIG:1-2 Spray(s) Both Nares 3 Times Daily PRN [provider] Taking Active   metFORMIN (GLUCOPHAGE) 1000 MG tablet [id number] Take 0.5 tablets (500 mg total) by mouth 2 (two) times daily with a meal. GRia Bush MD Taking Active   omeprazole (PRILOSEC) 20 MG capsule [id number] Take 1 tablet by mouth daily as needed. [provider] Taking Active   oxybutynin (DITROPAN) 5 MG tablet [id number] TAKE 1 TABLET (5 MG TOTAL) BY MOUTH 2 (TWO) TIMES DAILY AS NEEDED (INCONTINENCE). GRia Bush MD Taking Active           Patient Active Problem List   Diagnosis Date Noted  . Pedal edema 06/29/2020  . Hearing loss 01/02/2020  . Cirrhosis of liver ([place] 01/20/2019  . [place]clavian artery stenosis, left ([place] 12/22/2018  . Vitamin D deficiency 11/29/2018  . Bilateral serous otitis media 05/03/2018  . Emphysema of lung ([place] 12/27/2017  . Right hand pain 10/15/2017  . Hyperkalemia 09/19/2017  . TIA (transient ischemic attack) 08/29/2017  . Heart murmur, systolic 071/69/6789 . CKD stage 3 due to type 2 diabetes mellitus  (  [place]) 06/05/2017  . CAD (coronary artery disease) 12/10/2016  . Thoracic aortic atherosclerosis ([place]) 12/10/2016  . Carotid stenosis 06/04/2016  . Background diabetic retinopathy ([place]) 02/01/2016  . Urinary incontinence, mixed 11/30/2015  . Health maintenance examination 06/02/2015  . Osteopenia 05/27/2015  . Advanced care planning/counseling discussion 05/28/2014  . Abnormal TSH 05/28/2014  . Rhinorrhea 01/09/2014  . COPD, moderate ([place]) 02/12/2013  . HTN (hypertension)   . GERD (gastroesophageal reflux disease)   . Barrett esophagus 02/24/2012  . Medicare annual wellness visit, subsequent 12/24/2011  . Controlled diabetes mellitus type 2 with complications ([place])   . Hyperlipidemia associated with type 2 diabetes mellitus ([place])   . Smoker     Immunization History  Administered Date(s) Administered  . Fluad Quad(high Dose 65+) 01/28/2019, 12/31/2019  . Influenza Split 03/26/2012  . Influenza,inj,Quad PF,6+ Mos 01/04/2013, 01/09/2014, 06/02/2015, 05/27/2016, 05/31/2017, 03/06/2018  . PFIZER(Purple Top)SARS-COV-2 Vaccination 08/20/2019, 09/10/2019  . Pneumococcal Conjugate-13 01/09/2014  . Pneumococcal Polysaccharide-23 12/23/2011  . Td 12/23/2011    Conditions to be addressed/monitored:  Hypertension, Hyperlipidemia, Diabetes and COPD  Care Plan : [place]  Updates made by [name], [facility] since 08/11/2020 12:00 AM    Problem: CHL AMB "PATIENT-SPECIFIC PROBLEM"     Long-Range Goal: Patient Stated   Start Date: 08/11/2020  Priority: High  Note:   Current Barriers:  . Due for annual diabetic eye exam  Pharmacist Clinical Goal(s):  Marland Kitchen Patient will contact provider office for questions/concerns as evidenced notation of same in electronic health record through collaboration with PharmD and provider.   Interventions: . 1:1 collaboration with Ria Bush, MD regarding development and update of comprehensive plan of care as evidenced by provider  attestation and co-signature . Inter-disciplinary care team collaboration (see longitudinal plan of care) . Comprehensive medication review performed; medication list updated in electronic medical record  Hypertension (BP goal <140/90) -Controlled -  BP controlled in clinic -Current treatment: . Amlodipine 5 mg - 1/2 (2.5 mg) tablet daily  -Medications previously tried: lisinopril - hyperkalemia  -Amlodipine dose reduced March 2022, minimal improvement in swelling with dose reduction but BP has remained stable/controlled, reports wearing compression socks -Current home readings: elevated today 160/80, uses a wrist monitor, discussed holding at heart level - home check likely inaccurate  -Current exercise habits: minimal  -Current dietary habits: watches salt  -Caffeine: 2 cups coffee in morning (she has weaned down to this) -Salt: Denies uses saltshaker, unless eating tomatoes; eats out maybe once a week   -Drinks: Diet mountain dew every now and then, mainly water  -Denies cough/cold products or NSAIDs, only takes aspirin 81 mg OTC -Denies hypotensive/hypertensive symptoms -Educated on BP goals and benefits of medications for prevention of heart attack, stroke and kidney damage; -Counseled to monitor BP at home with symptoms, document, and provide log at future appointments -Recommended to continue current medication  Hyperlipidemia/CAD: (LDL goal < 70) -Controlled - LDL 61 -Current treatment: . Atorvastatin 20 mg - 1 tablet daily  . Aspirin 81 mg - 1 tablet every other daily  -Medications previously tried: none -Educated on Cholesterol goals;  -Recommended to continue current medication  Diabetes (A1c goal <7%) -Controlled - A1c 5.8% 06/2020 -CKD - GFR 39 ml/min  -Current medications: Marland Kitchen Metformin 1000 mg - 1/2 tablet in morning and 1/2 tablet at night . Farxiga 5 mg - 1 tablet daily (new - started 03/22) -Medications previously tried: Januvia - replaced with [place] -Current  home glucose readings - checking blood glucose daily,  morning or bedtime . fasting glucose: 99-109 . post prandial glucose: none . Bedtime: 119 (last night) -Denies hypoglycemic/hyperglycemic symptoms -Current meal patterns: reports good appetite  . breakfast: Oatmeal or bacon on toast . lunch: Sandwich or bowl of soup . dinner: Varies  . drinks: Water, 2 cups of regular coffee daily  -Current exercise: minimal due to leg swelling, SOB -Educated on Prevention and management of hypoglycemic episodes; -Counseled to check feet daily and get yearly eye exams - up to date on foot exam, due for eye exam -Recommended to continue current medication; Schedule annual eye exam   COPD (Goal: control symptoms and prevent exacerbations) Query-Controlled - pt reports symptoms controlled, no recent exacerbations -She does use albuterol inhaler most days -Difficulty walking 200 ft without stopping to rest. In process of applying for handicap placard. -Current treatment  . Albuterol inhaler - 2 puffs as needed -Medications previously tried: none  -Gold Grade: Gold 2 (FEV1 50-79%) -Pulmonary function testing: 2014 - FEV1 59% -Exacerbations requiring treatment in last 6 months: none -Patient denies consistent use of maintenance inhaler - none prescribed, no history of maintenance inhaler use -Frequency of rescue inhaler use: uses at night and PRN depending on her activity level, not daily. She did not provide an exact frequency, depends on the day -Tobacco use: 1 pack lasts 2-3 weeks, smokes primarily for anxiety/nerves  -Counseled on When to use rescue inhaler -Recommended to continue current medication; Patient to call if interested in tobacco cessation support  Patient Goals/Self-Care Activities . Patient will:  - take medications as prescribed check glucose daily, document, and provide at future appointments  Follow Up Plan: Telephone follow up appointment with care management team member  scheduled for: 6 months PCP visit - July 2022     Medication Assistance: None required.  Patient affirms current coverage meets needs.  Star Rating Drugs:  Medication:                Last Fill:         Day Supply Atorvastatin 68m.      08/08/2020        90ds     Farxiga 5 mg.              07/27/2020          30ds     Metformin 10034m     06/07/2020        90ds  Reviewed - no gaps in adherence  Patient's preferred pharmacy is:  [id number] [address] [id number]: [phone]: [phone] up meds [facility] Uses pill box? No - tries to take medications at the same time (7 AM) every morning, evening doses around 5-6 PM, uses a pillbox if going out of town, otherwise, keeps everything on living room table, refills timely, able to state name of medications Pt endorses 100% compliance  We discussed: Benefits of medication synchronization, packaging and delivery as well as enhanced pharmacist oversight with [facility]. Patient likes [facility]. Reports they will deliver if needed.  Patient decided to: Continue current medication management strategy  Care Plan and Follow Up Patient Decision:  Patient agrees to Care Plan and Follow-up.  MicDebbora DusharmD Clinical Pharmacist [phone]

## 2020-08-11 NOTE — Patient Instructions (Signed)
Dear Cheryl Bird,  Below is a summary of the goals we discussed during our follow up appointment on August 11, 2020. Please contact me anytime with questions or concerns.   Visit Information  Patient Care Plan: CCM Pharmacy Care Plan    Problem Identified: CHL AMB "PATIENT-SPECIFIC PROBLEM"     Long-Range Goal: Patient Stated   Start Date: 08/11/2020  Priority: High  Note:   Current Barriers:  . Due for annual diabetic eye exam  Pharmacist Clinical Goal(s):  Marland Kitchen Patient will contact provider office for questions/concerns as evidenced notation of same in electronic health record through collaboration with PharmD and provider.   Interventions: . 1:1 collaboration with Ria Bush, MD regarding development and update of comprehensive plan of care as evidenced by provider attestation and co-signature . Inter-disciplinary care team collaboration (see longitudinal plan of care) . Comprehensive medication review performed; medication list updated in electronic medical record  Hypertension (BP goal <140/90) -Controlled -  BP controlled in clinic -Current treatment: . Amlodipine 5 mg - 1/2 (2.5 mg) tablet daily  -Medications previously tried: lisinopril - hyperkalemia  -Amlodipine dose reduced March 2022, minimal improvement in swelling with dose reduction but BP has remained stable/controlled, reports wearing compression socks -Current home readings: elevated today 160/80, uses a wrist monitor, discussed holding at heart level - home check likely inaccurate  -Current exercise habits: minimal  -Current dietary habits: watches salt  -Caffeine: 2 cups coffee in morning (she has weaned down to this) -Salt: Denies uses saltshaker, unless eating tomatoes; eats out maybe once a week   -Drinks: Diet mountain dew every now and then, mainly water  -Denies cough/cold products or NSAIDs, only takes aspirin 81 mg OTC -Denies hypotensive/hypertensive symptoms -Educated on BP goals and benefits  of medications for prevention of heart attack, stroke and kidney damage; -Counseled to monitor BP at home with symptoms, document, and provide log at future appointments -Recommended to continue current medication  Hyperlipidemia/CAD: (LDL goal < 70) -Controlled - LDL 61 -Current treatment: . Atorvastatin 20 mg - 1 tablet daily  . Aspirin 81 mg - 1 tablet every other daily  -Medications previously tried: none -Educated on Cholesterol goals;  -Recommended to continue current medication  Diabetes (A1c goal <7%) -Controlled - A1c 5.8% 06/2020 -CKD - GFR 39 ml/min  -Current medications: Marland Kitchen Metformin 1000 mg - 1/2 tablet in morning and 1/2 tablet at night . Farxiga 5 mg - 1 tablet daily (new - started 03/22) -Medications previously tried: Januvia - replaced with [place] -Current home glucose readings - checking blood glucose daily, morning or bedtime . fasting glucose: 99-109 . post prandial glucose: none . Bedtime: 119 (last night) -Denies hypoglycemic/hyperglycemic symptoms -Current meal patterns: reports good appetite  . breakfast: Oatmeal or bacon on toast . lunch: Sandwich or bowl of soup . dinner: Varies  . drinks: Water, 2 cups of regular coffee daily  -Current exercise: minimal due to leg swelling, SOB -Educated on Prevention and management of hypoglycemic episodes; -Counseled to check feet daily and get yearly eye exams - up to date on foot exam, due for eye exam -Recommended to continue current medication; Schedule annual eye exam   COPD (Goal: control symptoms and prevent exacerbations) Query-Controlled - pt reports symptoms controlled, no recent exacerbations -She does use albuterol inhaler most days -Difficulty walking 200 ft without stopping to rest. In process of applying for handicap placard. -Current treatment  . Albuterol inhaler - 2 puffs as needed -Medications previously tried: none  -Gold Grade: Gold  2 (FEV1 50-79%) -Pulmonary function testing: 2014 - FEV1  59% -Exacerbations requiring treatment in last 6 months: none -Patient denies consistent use of maintenance inhaler - none prescribed, no history of maintenance inhaler use -Frequency of rescue inhaler use: uses at night and PRN depending on her activity level, not daily. She did not provide an exact frequency, depends on the day -Tobacco use: 1 pack lasts 2-3 weeks, smokes primarily for anxiety/nerves  -Counseled on When to use rescue inhaler -Recommended to continue current medication; Patient to call if interested in tobacco cessation support  Patient Goals/Self-Care Activities . Patient will:  - take medications as prescribed check glucose daily, document, and provide at future appointments  Follow Up Plan: Telephone follow up appointment with care management team member scheduled for: 6 months PCP visit - July 2022      The patient verbalized understanding of instructions, educational materials, and care plan provided today and agreed to receive a mailed copy of patient instructions, educational materials, and care plan.  Telephone follow up appointment with pharmacy team member scheduled for: October 2022   [name], PharmD Clinical Pharmacist [facility] [phone]   [email or web address]">  DASH Eating Plan DASH stands for Dietary Approaches to Stop Hypertension. The DASH eating plan is a healthy eating plan that has been shown to:  Reduce high blood pressure (hypertension).  Reduce your risk for type 2 diabetes, heart disease, and stroke.  Help with weight loss. What are tips for following this plan? Reading food labels  Check food labels for the amount of salt (sodium) per serving. Choose foods with less than 5 percent of the Daily Value of sodium. Generally, foods with less than 300 milligrams (mg) of sodium per serving fit into this eating plan.  To find whole grains, look for the word  "whole" as the first word in the ingredient list. Shopping  Buy products labeled as "low-sodium" or "no salt added."  Buy fresh foods. Avoid canned foods and pre-made or frozen meals. Cooking  Avoid adding salt when cooking. Use salt-free seasonings or herbs instead of table salt or sea salt. Check with your health care provider or pharmacist before using salt substitutes.  Do not fry foods. Cook foods using healthy methods such as baking, boiling, grilling, roasting, and broiling instead.  Cook with heart-healthy oils, such as olive, canola, avocado, soybean, or sunflower oil. Meal planning  Eat a balanced diet that includes: ? 4 or more servings of fruits and 4 or more servings of vegetables each day. Try to fill one-half of your plate with fruits and vegetables. ? 6-8 servings of whole grains each day. ? Less than 6 oz (170 g) of lean meat, poultry, or fish each day. A 3-oz (85-g) serving of meat is about the same size as a deck of cards. One egg equals 1 oz (28 g). ? 2-3 servings of low-fat dairy each day. One serving is 1 cup (237 mL). ? 1 serving of nuts, seeds, or beans 5 times each week. ? 2-3 servings of heart-healthy fats. Healthy fats called omega-3 fatty acids are found in foods such as walnuts, flaxseeds, fortified milks, and eggs. These fats are also found in cold-water fish, such as sardines, salmon, and mackerel.  Limit how much you eat of: ? Canned or prepackaged foods. ? Food that is high in trans fat, such as some fried foods. ? Food that is high in saturated fat, such as fatty meat. ? Desserts and other sweets, sugary  drinks, and other foods with added sugar. ? Full-fat dairy products.  Do not salt foods before eating.  Do not eat more than 4 egg yolks a week.  Try to eat at least 2 vegetarian meals a week.  Eat more home-cooked food and less restaurant, buffet, and fast food.   Lifestyle  When eating at a restaurant, ask that your food be prepared with less  salt or no salt, if possible.  If you drink alcohol: ? Limit how much you use to:  0-1 drink a day for women who are not pregnant.  0-2 drinks a day for men. ? Be aware of how much alcohol is in your drink. In the [place] one drink equals one 12 oz bottle of beer (355 mL), one 5 oz glass of wine (148 mL), or one 1 oz glass of hard liquor (44 mL). General information  Avoid eating more than 2,300 mg of salt a day. If you have hypertension, you may need to reduce your sodium intake to 1,500 mg a day.  Work with your health care provider to maintain a healthy body weight or to lose weight. Ask what an ideal weight is for you.  Get at least 30 minutes of exercise that causes your heart to beat faster (aerobic exercise) most days of the week. Activities may include walking, swimming, or biking.  Work with your health care provider or dietitian to adjust your eating plan to your individual calorie needs. What foods should I eat? Fruits All fresh, dried, or frozen fruit. Canned fruit in natural juice (without added sugar). Vegetables Fresh or frozen vegetables (raw, steamed, roasted, or grilled). Low-sodium or reduced-sodium tomato and vegetable juice. Low-sodium or reduced-sodium tomato sauce and tomato paste. Low-sodium or reduced-sodium canned vegetables. Grains Whole-grain or whole-wheat bread. Whole-grain or whole-wheat pasta. Brown rice. [place]. Bulgur. Whole-grain and low-sodium cereals. Pita bread. Low-fat, low-sodium crackers. Whole-wheat flour tortillas. Meats and other proteins Skinless chicken or [place]. Ground chicken or [place]. Pork with fat trimmed off. Fish and seafood. Egg whites. Dried beans, peas, or lentils. Unsalted nuts, nut butters, and seeds. Unsalted canned beans. Lean cuts of beef with fat trimmed off. Low-sodium, lean precooked or cured meat, such as sausages or meat loaves. Dairy Low-fat (1%) or fat-free (skim) milk. Reduced-fat, low-fat, or fat-free cheeses.  Nonfat, low-sodium ricotta or cottage cheese. Low-fat or nonfat yogurt. Low-fat, low-sodium cheese. Fats and oils Soft margarine without trans fats. Vegetable oil. Reduced-fat, low-fat, or light mayonnaise and salad dressings (reduced-sodium). Canola, safflower, olive, avocado, soybean, and sunflower oils. Avocado. Seasonings and condiments Herbs. Spices. Seasoning mixes without salt. Other foods Unsalted popcorn and pretzels. Fat-free sweets. The items listed above may not be a complete list of foods and beverages you can eat. Contact a dietitian for more information. What foods should I avoid? Fruits Canned fruit in a light or heavy syrup. Fried fruit. Fruit in cream or butter sauce. Vegetables Creamed or fried vegetables. Vegetables in a cheese sauce. Regular canned vegetables (not low-sodium or reduced-sodium). Regular canned tomato sauce and paste (not low-sodium or reduced-sodium). Regular tomato and vegetable juice (not low-sodium or reduced-sodium). Angie Fava. Olives. Grains Baked goods made with fat, such as croissants, muffins, or some breads. Dry pasta or rice meal packs. Meats and other proteins Fatty cuts of meat. Ribs. Fried meat. Berniece Salines. Bologna, salami, and other precooked or cured meats, such as sausages or meat loaves. Fat from the back of a pig (fatback). Bratwurst. Salted nuts and seeds. Canned beans with added  salt. Canned or smoked fish. Whole eggs or egg yolks. Chicken or [place] with skin. Dairy Whole or 2% milk, cream, and half-and-half. Whole or full-fat cream cheese. Whole-fat or sweetened yogurt. Full-fat cheese. Nondairy creamers. Whipped toppings. Processed cheese and cheese spreads. Fats and oils Butter. Stick margarine. Lard. Shortening. Ghee. Bacon fat. Tropical oils, such as coconut, palm kernel, or palm oil. Seasonings and condiments Onion salt, garlic salt, seasoned salt, table salt, and sea salt. Worcestershire sauce. Tartar sauce. Barbecue sauce. Teriyaki  sauce. Soy sauce, including reduced-sodium. Steak sauce. Canned and packaged gravies. Fish sauce. Oyster sauce. Cocktail sauce. Store-bought horseradish. Ketchup. Mustard. Meat flavorings and tenderizers. Bouillon cubes. Hot sauces. Pre-made or packaged marinades. Pre-made or packaged taco seasonings. Relishes. Regular salad dressings. Other foods Salted popcorn and pretzels. The items listed above may not be a complete list of foods and beverages you should avoid. Contact a dietitian for more information. Where to find more information  National Heart, Lung, and Blood Institute: https://wilson-eaton.com/  American Heart Association: www.heart.org  Academy of Nutrition and Dietetics: www.eatright.[place]: www.kidney.org Summary  The DASH eating plan is a healthy eating plan that has been shown to reduce high blood pressure (hypertension). It may also reduce your risk for type 2 diabetes, heart disease, and stroke.  When on the DASH eating plan, aim to eat more fresh fruits and vegetables, whole grains, lean proteins, low-fat dairy, and heart-healthy fats.  With the DASH eating plan, you should limit salt (sodium) intake to 2,300 mg a day. If you have hypertension, you may need to reduce your sodium intake to 1,500 mg a day.  Work with your health care provider or dietitian to adjust your eating plan to your individual calorie needs. This information is not intended to replace advice given to you by your health care provider. Make sure you discuss any questions you have with your health care provider. Document Revised: 03/15/2019 Document Reviewed: 03/15/2019 Elsevier Patient Education  2021 [employer].

## 2020-08-11 NOTE — Progress Notes (Signed)
Encounter details: CCM Time Spent       Value Time User   Time spent with patient (minutes)  50 08/11/2020  3:32 PM [name], [facility]   Time spent performing Chart review  30 08/11/2020  3:32 PM [name], [facility]   Total time (minutes)  80 08/11/2020  3:32 PM [name], RPH      Moderate to High Complex Decision Making       Value Time User   Moderate to High complex decision making  Yes 08/11/2020  3:32 PM [name], [facility]      CCM Services: This encounter meets complex CCM services and moderate to high decision making.  Prior to outreach and patient consent for Chronic Care Management, I referred this patient for services after reviewing the nominated patient list or from a personal encounter with the patient.  I have personally reviewed this encounter including the documentation in this note and have collaborated with the care management provider regarding care management and care coordination activities to include development and update of the comprehensive care plan. I am certifying that I agree with the content of this note and encounter as supervising physician.

## 2020-10-07 ENCOUNTER — Telehealth: Payer: Self-pay

## 2020-10-07 NOTE — Chronic Care Management (AMB) (Addendum)
    Chronic Care Management Pharmacy Assistant   Name: Cheryl Bird  MRN: [id number] DOB: 12/24/1945  Reason for Encounter: Disease State - Diabetes  Recent office visits:  None since last CCM contact  Recent consult visits:  None since last CCM contact  Hospital visits:  None in previous 6 months  Medications: Outpatient Encounter Medications as of 10/07/2020  Medication Sig   albuterol (VENTOLIN HFA) 108 (90 Base) MCG/ACT inhaler TAKE 2 PUFFS BY MOUTH EVERY 6 HOURS AS NEEDED FOR WHEEZE   amLODipine (NORVASC) 5 MG tablet Take 0.5 tablets (2.5 mg total) by mouth daily.   aspirin EC 81 MG tablet Take 1 tablet (81 mg total) by mouth every other day.   atorvastatin (LIPITOR) 20 MG tablet TAKE 1 TABLET (20 MG TOTAL) BY MOUTH DAILY AT 6 PM.   D3-50 1.25 MG (50000 UT) capsule TAKE 1 CAPULE BY MOUTH ONE TIME PER WEEK   dapagliflozin propanediol (FARXIGA) 5 MG TABS tablet Take 1 tablet (5 mg total) by mouth daily before breakfast.   ipratropium (ATROVENT) 0.03 % nasal spray SMARTSIG:1-2 Spray(s) Both Nares 3 Times Daily PRN   metFORMIN (GLUCOPHAGE) 1000 MG tablet Take 0.5 tablets (500 mg total) by mouth 2 (two) times daily with a meal.   omeprazole (PRILOSEC) 20 MG capsule Take 1 tablet by mouth daily as needed.   oxybutynin (DITROPAN) 5 MG tablet TAKE 1 TABLET (5 MG TOTAL) BY MOUTH 2 (TWO) TIMES DAILY AS NEEDED (INCONTINENCE).   No facility-administered encounter medications on file as of 10/07/2020.   Recent Relevant Labs: Lab Results  Component Value Date/Time   HGBA1C 5.8 (A) 06/29/2020 11:08 AM   HGBA1C 7.3 (H) 11/26/2019 09:30 AM   HGBA1C 7.3 (A) 06/05/2019 10:20 AM   HGBA1C 7.2 (H) 06/08/2018 09:21 AM   MICROALBUR 90.7 (H) 11/26/2019 10:31 AM   MICROALBUR 313.7 (H) 06/08/2018 09:21 AM    Kidney Function Lab Results  Component Value Date/Time   CREATININE 1.34 (H) 07/31/2020 02:27 PM   CREATININE 1.29 (H) 06/29/2020 12:24 PM   CREATININE 1.02 05/07/2007 12:00 AM   GFR  38.95 (L) 07/31/2020 02:27 PM   GFRNONAA 33 (L) 08/25/2017 04:50 PM   GFRAA 38 (L) 08/25/2017 04:50 PM   Attempted contact with Samuella Bruin Fiorella 3 times on 10/07/20,10/14/20,10/15/20. Unsuccessful outreach. Will attempt contact next month.   Adherence Review: Is the patient currently on a STATIN medication? Yes Is the patient currently on ACE/ARB medication? No Does the patient have >5 day gap between last estimated fill dates? No  Star Rating Drugs: Medication:  Last Fill: Day Supply Farxiga 5mg   09/23/20  30 Atorvastatin 20mg  08/08/20 90 Metformin 1000mg  09/17/20 90  Follow-Up:  Pharmacist Review  [name], CPP notified  Avel Sensor [facility] Clinical Pharmacy Assistant [phone]  I have reviewed the care management and care coordination activities outlined in this encounter and I am certifying that I agree with the content of this note. No further action required.  [name], PharmD Clinical Pharmacist [facility] at [facility] [phone]

## 2020-10-20 ENCOUNTER — Other Ambulatory Visit: Payer: Self-pay | Admitting: Family Medicine

## 2020-10-30 ENCOUNTER — Other Ambulatory Visit: Payer: Self-pay

## 2020-10-30 ENCOUNTER — Ambulatory Visit (INDEPENDENT_AMBULATORY_CARE_PROVIDER_SITE_OTHER): Payer: Medicare Other | Admitting: Family Medicine

## 2020-10-30 ENCOUNTER — Encounter: Payer: Self-pay | Admitting: Family Medicine

## 2020-10-30 VITALS — BP 126/64 | HR 97 | Temp 98.5°F | Ht 61.0 in | Wt 124.5 lb

## 2020-10-30 DIAGNOSIS — E113299 Type 2 diabetes mellitus with mild nonproliferative diabetic retinopathy without macular edema, unspecified eye: Secondary | ICD-10-CM | POA: Diagnosis not present

## 2020-10-30 DIAGNOSIS — E1165 Type 2 diabetes mellitus with hyperglycemia: Secondary | ICD-10-CM

## 2020-10-30 DIAGNOSIS — E1122 Type 2 diabetes mellitus with diabetic chronic kidney disease: Secondary | ICD-10-CM

## 2020-10-30 DIAGNOSIS — N183 Chronic kidney disease, stage 3 unspecified: Secondary | ICD-10-CM

## 2020-10-30 DIAGNOSIS — I700 Atherosclerosis of aorta: Secondary | ICD-10-CM

## 2020-10-30 DIAGNOSIS — I771 Stricture of artery: Secondary | ICD-10-CM | POA: Diagnosis not present

## 2020-10-30 DIAGNOSIS — R600 Localized edema: Secondary | ICD-10-CM

## 2020-10-30 DIAGNOSIS — E118 Type 2 diabetes mellitus with unspecified complications: Secondary | ICD-10-CM

## 2020-10-30 DIAGNOSIS — I6523 Occlusion and stenosis of bilateral carotid arteries: Secondary | ICD-10-CM

## 2020-10-30 DIAGNOSIS — IMO0002 Reserved for concepts with insufficient information to code with codable children: Secondary | ICD-10-CM

## 2020-10-30 DIAGNOSIS — K7460 Unspecified cirrhosis of liver: Secondary | ICD-10-CM

## 2020-10-30 LAB — POCT GLYCOSYLATED HEMOGLOBIN (HGB A1C): Hemoglobin A1C: 8.4 % — AB (ref 4.0–5.6)

## 2020-10-30 MED ORDER — ALBUTEROL SULFATE HFA 108 (90 BASE) MCG/ACT IN AERS: INHALATION_SPRAY | RESPIRATORY_TRACT | 6 refills | Status: AC

## 2020-10-30 NOTE — Progress Notes (Signed)
Patient ID: Cheryl Bird, female    DOB: October 30, 1945, 75 y.o.   MRN: [id number]  This visit was conducted in person.  BP 126/64   Pulse 97   Temp 98.5 F (36.9 C) (Temporal)   Ht 5\' 1"  (1.549 m)   Wt 124 lb 8 oz (56.5 kg)   SpO2 94%   BMI 23.52 kg/m    CC: 3 mo f/u visit  Subjective:   HPI: Cheryl Bird is a 75 y.o. female presenting on 10/30/2020 for Diabetes (Here for 3 mo f/u.)   Known left subclavian stenosis - denies arm pains.   Suspect early cirrhosis followed by GI Q6 mo. Still has not received call to schedule GI f/u [facility]) - # provided to call and schedule f/u.  Progressive pedal edema ?cirrhosis related - last visit she started using copper fit compression stockings. Pedal edema is some better since she started using compression socks and increased water intake. She also regularly elevates legs. SIL just installed new walk in shower.   DM - does intermittently check sugars - 200s last night. Compliant with antihyperglycemic regimen which includes: metformin 500mg  BID, farxiga 5mg  daily. Januvia stopped 06/2020. Denies diet changes. Denies low sugars or hypoglycemic symptoms. Denies paresthesias. Last diabetic eye exam DUE. Pneumovax: 11/2011. Prevnar: 12/2013. Glucometer brand: one-touch. DSME: has remotely completed. Lab Results  Component Value Date   HGBA1C 8.4 (A) 10/30/2020   Diabetic Foot Exam - Simple   Simple Foot Form Diabetic Foot exam was performed with the following findings: Yes 10/30/2020  2:59 PM  Visual Inspection No deformities, no ulcerations, no other skin breakdown bilaterally: Yes Sensation Testing Intact to touch and monofilament testing bilaterally: Yes Pulse Check See comments: Yes Comments 1+ DP bilaterally    Lab Results  Component Value Date   MICROALBUR 90.7 (H) 11/26/2019        Relevant past medical, surgical, family and social history reviewed and updated as indicated. Interim medical history since our last visit  reviewed. Allergies and medications reviewed and updated. Outpatient Medications Prior to Visit  Medication Sig Dispense Refill   amLODipine (NORVASC) 5 MG tablet Take 0.5 tablets (2.5 mg total) by mouth daily.     aspirin EC 81 MG tablet Take 1 tablet (81 mg total) by mouth every other day.     atorvastatin (LIPITOR) 20 MG tablet TAKE 1 TABLET (20 MG TOTAL) BY MOUTH DAILY AT 6 PM. 90 tablet 3   D3-50 1.25 MG (50000 UT) capsule TAKE 1 CAPULE BY MOUTH ONE TIME PER WEEK 12 capsule 0   FARXIGA 5 MG TABS tablet TAKE 1 TABLET BY MOUTH DAILY BEFORE BREAKFAST. 30 tablet 4   ipratropium (ATROVENT) 0.03 % nasal spray SMARTSIG:1-2 Spray(s) Both Nares 3 Times Daily PRN     metFORMIN (GLUCOPHAGE) 1000 MG tablet Take 0.5 tablets (500 mg total) by mouth 2 (two) times daily with a meal. 90 tablet 3   omeprazole (PRILOSEC) 20 MG capsule Take 1 capsule (20 mg total) by mouth daily.     oxybutynin (DITROPAN) 5 MG tablet TAKE 1 TABLET (5 MG TOTAL) BY MOUTH 2 (TWO) TIMES DAILY AS NEEDED (INCONTINENCE). 180 tablet 1   albuterol (VENTOLIN HFA) 108 (90 Base) MCG/ACT inhaler TAKE 2 PUFFS BY MOUTH EVERY 6 HOURS AS NEEDED FOR WHEEZE 18 g 6   omeprazole (PRILOSEC) 20 MG capsule Take 1 tablet by mouth daily as needed.     sitaGLIPtin (JANUVIA) 50 MG tablet Take 1 tablet (50  mg total) by mouth daily.     omeprazole (PRILOSEC) 20 MG capsule Take 1 capsule (20 mg total) by mouth daily.     No facility-administered medications prior to visit.     Per HPI unless specifically indicated in ROS section below Review of Systems  Objective:  BP 126/64   Pulse 97   Temp 98.5 F (36.9 C) (Temporal)   Ht 5\' 1"  (1.549 m)   Wt 124 lb 8 oz (56.5 kg)   SpO2 94%   BMI 23.52 kg/m   Wt Readings from Last 3 Encounters:  10/30/20 124 lb 8 oz (56.5 kg)  07/31/20 126 lb 4 oz (57.3 kg)  06/29/20 128 lb 3 oz (58.1 kg)      Physical Exam Vitals and nursing note reviewed.  Constitutional:      Appearance: Normal appearance. She  is not ill-appearing.  Eyes:     Extraocular Movements: Extraocular movements intact.     Conjunctiva/sclera: Conjunctivae normal.     Pupils: Pupils are equal, round, and reactive to light.  Neck:     Vascular: Carotid bruit (bilateral - referred) present.  Cardiovascular:     Rate and Rhythm: Normal rate and regular rhythm.     Pulses: Normal pulses.     Heart sounds: Normal heart sounds. No murmur heard.    Comments: Presumed referred bruit from known L subclavian stenosis Pulmonary:     Effort: Pulmonary effort is normal. No respiratory distress.     Breath sounds: Normal breath sounds. No wheezing, rhonchi or rales.  Abdominal:     General: Bowel sounds are normal. There is no distension.     Palpations: Abdomen is soft. There is hepatomegaly (nontender, mild). There is no mass.     Tenderness: There is no abdominal tenderness. There is no guarding or rebound. Negative signs include Murphy's sign.     Hernia: No hernia is present.  Musculoskeletal:     Right lower leg: Edema (tr) present.     Left lower leg: Edema (tr) present.     Comments: See HPI for foot exam if completed  Skin:    General: Skin is warm and dry.     Findings: No rash.     Comments: Scar to R anterior shin after recent trauma  Neurological:     Mental Status: She is alert.  Psychiatric:        Mood and Affect: Mood normal.        Behavior: Behavior normal.      Results for orders placed or performed in visit on 10/30/20  Comprehensive metabolic panel  Result Value Ref Range   Glucose, Bld 154 (H) 65 - 99 mg/dL   BUN 18 7 - 25 mg/dL   Creat 1.43 (H) 0.60 - 0.93 mg/dL   BUN/Creatinine Ratio 13 6 - 22 (calc)   Sodium 140 135 - 146 mmol/L   Potassium 5.0 3.5 - 5.3 mmol/L   Chloride 108 98 - 110 mmol/L   CO2 24 20 - 32 mmol/L   Calcium 9.2 8.6 - 10.4 mg/dL   Total Protein 6.1 6.1 - 8.1 g/dL   Albumin 3.6 3.6 - 5.1 g/dL   Globulin 2.5 1.9 - 3.7 g/dL (calc)   AG Ratio 1.4 1.0 - 2.5 (calc)   Total  Bilirubin 0.3 0.2 - 1.2 mg/dL   Alkaline phosphatase (APISO) 86 37 - 153 U/L   AST 24 10 - 35 U/L   ALT 16 6 - 29 U/L  CBC with Differential/Platelet  Result Value Ref Range   WBC 8.6 3.8 - 10.8 Thousand/uL   RBC 3.56 (L) 3.80 - 5.10 Million/uL   Hemoglobin 9.0 (L) 11.7 - 15.5 g/dL   HCT 29.1 (L) 35.0 - 45.0 %   MCV 81.7 80.0 - 100.0 fL   MCH 25.3 (L) 27.0 - 33.0 pg   MCHC 30.9 (L) 32.0 - 36.0 g/dL   RDW 14.7 11.0 - 15.0 %   Platelets 320 140 - 400 Thousand/uL   MPV 10.7 7.5 - 12.5 fL   Neutro Abs 5,530 1,500 - 7,800 cells/uL   Lymphs Abs 2,038 850 - 3,900 cells/uL   Absolute Monocytes 679 200 - 950 cells/uL   Eosinophils Absolute 292 15 - 500 cells/uL   Basophils Absolute 60 0 - 200 cells/uL   Neutrophils Relative % 64.3 %   Total Lymphocyte 23.7 %   Monocytes Relative 7.9 %   Eosinophils Relative 3.4 %   Basophils Relative 0.7 %  Protime-INR  Result Value Ref Range   INR 1.0    Prothrombin Time 9.8 9.0 - 11.5 sec  POCT glycosylated hemoglobin (Hb A1C)  Result Value Ref Range   Hemoglobin A1C 8.4 (A) 4.0 - 5.6 %   HbA1c POC (<> result, manual entry)     HbA1c, POC (prediabetic range)     HbA1c, POC (controlled diabetic range)      Assessment & Plan:  This visit occurred during the SARS-CoV-2 public health emergency.  Safety protocols were in place, including screening questions prior to the visit, additional usage of staff PPE, and extensive cleaning of exam room while observing appropriate contact time as indicated for disinfecting solutions.   Problem List Items Addressed This Visit     Diabetes mellitus type 2, uncontrolled, with complications ([place]) - Primary    Deterioration noted in glycemic control on metformin and farxiga - will restart [place] 50mg  daily - she has a supply at home from previous use.  Encouraged she schedule eye exam.  Foot exam today.        Relevant Medications   sitaGLIPtin (JANUVIA) 50 MG tablet   Other Relevant Orders   POCT  glycosylated hemoglobin (Hb A1C) (Completed)   Background diabetic retinopathy ([place])    Overdue for eye exam - encouraged she call and schedule        Relevant Medications   sitaGLIPtin (JANUVIA) 50 MG tablet   Carotid stenosis    Mild carotid stenosis (1-39%), h/o L SCa stenosis however latest carotid US 01/2020 returned with normal [place] flow bilaterally - will be due for rpt [place] 01/2021        Thoracic aortic atherosclerosis ([facility])    Continue aspirin, statin.        CKD stage 3 due to type 2 diabetes mellitus ([place])    Update renal panel        Relevant Medications   sitaGLIPtin (JANUVIA) 50 MG tablet   Subclavian artery stenosis, left ([place])    Improved by latest [place] 01/2020 however I still hear significant bruit. Continue to monitor.        Cirrhosis of liver ([place])    Overdue for GI f/u - I gave pt # to call and schedule f/u appt.  Fib-4 score = 1.4 - low risk for fibrosis.  MELD-Na score: 10 at 10/30/2020  3:22 PM MELD score: 10 at 10/30/2020  3:22 PM Calculated from: Serum Creatinine: 1.43 mg/dL at 10/30/2020  3:22 PM Serum Sodium: 140 mmol/L (Using max  of 137 mmol/L) at 10/30/2020  3:22 PM Total Bilirubin: 0.3 mg/dL (Using min of 1 mg/dL) at 10/30/2020  3:22 PM INR(ratio): 1.0 at 10/30/2020  3:22 PM Age: 65 years        Relevant Orders   Comprehensive metabolic panel (Completed)   CBC with Differential/Platelet (Completed)   Protime-INR (Completed)   Pedal edema    Improvement noted with regular compression sock use as well as increased water and leg elevation. Continue to monitor.          Meds ordered this encounter  Medications   albuterol (VENTOLIN HFA) 108 (90 Base) MCG/ACT inhaler    Sig: TAKE 2 PUFFS BY MOUTH EVERY 6 HOURS AS NEEDED FOR WHEEZE    Dispense:  18 g    Refill:  6    Orders Placed This Encounter  Procedures   Comprehensive metabolic panel   CBC with Differential/Platelet   Protime-INR   POCT glycosylated hemoglobin (Hb A1C)    Patient  Instructions  Schedule eye exam as you're due.  Call [facility] to schedule follow up visit [phone]).  Sugar was too high - restart [place], continue metformin and farxiga.  Labs today  Good to see you today Return as needed or in 3 months for follow up visit.   Follow up plan: Return in about 3 months (around 01/30/2021), or if symptoms worsen or fail to improve, for follow up visit.  Ria Bush, MD

## 2020-10-30 NOTE — Patient Instructions (Addendum)
Schedule eye exam as you're due.  Call [facility] to schedule follow up visit [phone]).  Sugar was too high - restart [place], continue metformin and farxiga.  Labs today  Good to see you today Return as needed or in 3 months for follow up visit.

## 2020-10-31 LAB — COMPREHENSIVE METABOLIC PANEL
AG Ratio: 1.4 (calc) (ref 1.0–2.5)
ALT: 16 U/L (ref 6–29)
AST: 24 U/L (ref 10–35)
Albumin: 3.6 g/dL (ref 3.6–5.1)
Alkaline phosphatase (APISO): 86 U/L (ref 37–153)
BUN/Creatinine Ratio: 13 (calc) (ref 6–22)
BUN: 18 mg/dL (ref 7–25)
CO2: 24 mmol/L (ref 20–32)
Calcium: 9.2 mg/dL (ref 8.6–10.4)
Chloride: 108 mmol/L (ref 98–110)
Creat: 1.43 mg/dL — ABNORMAL HIGH (ref 0.60–0.93)
Globulin: 2.5 g/dL (calc) (ref 1.9–3.7)
Glucose, Bld: 154 mg/dL — ABNORMAL HIGH (ref 65–99)
Potassium: 5 mmol/L (ref 3.5–5.3)
Sodium: 140 mmol/L (ref 135–146)
Total Bilirubin: 0.3 mg/dL (ref 0.2–1.2)
Total Protein: 6.1 g/dL (ref 6.1–8.1)

## 2020-10-31 LAB — CBC WITH DIFFERENTIAL/PLATELET
Absolute Monocytes: 679 cells/uL (ref 200–950)
Basophils Absolute: 60 cells/uL (ref 0–200)
Basophils Relative: 0.7 %
Eosinophils Absolute: 292 cells/uL (ref 15–500)
Eosinophils Relative: 3.4 %
HCT: 29.1 % — ABNORMAL LOW (ref 35.0–45.0)
Hemoglobin: 9 g/dL — ABNORMAL LOW (ref 11.7–15.5)
Lymphs Abs: 2038 cells/uL (ref 850–3900)
MCH: 25.3 pg — ABNORMAL LOW (ref 27.0–33.0)
MCHC: 30.9 g/dL — ABNORMAL LOW (ref 32.0–36.0)
MCV: 81.7 fL (ref 80.0–100.0)
MPV: 10.7 fL (ref 7.5–12.5)
Monocytes Relative: 7.9 %
Neutro Abs: 5530 cells/uL (ref 1500–7800)
Neutrophils Relative %: 64.3 %
Platelets: 320 10*3/uL (ref 140–400)
RBC: 3.56 10*6/uL — ABNORMAL LOW (ref 3.80–5.10)
RDW: 14.7 % (ref 11.0–15.0)
Total Lymphocyte: 23.7 %
WBC: 8.6 10*3/uL (ref 3.8–10.8)

## 2020-10-31 LAB — PROTIME-INR
INR: 1
Prothrombin Time: 9.8 s (ref 9.0–11.5)

## 2020-10-31 NOTE — Assessment & Plan Note (Signed)
Continue aspirin, statin.  

## 2020-10-31 NOTE — Assessment & Plan Note (Signed)
Improvement noted with regular compression sock use as well as increased water and leg elevation. Continue to monitor.

## 2020-10-31 NOTE — Assessment & Plan Note (Signed)
Deterioration noted in glycemic control on metformin and farxiga - will restart [place] 50mg  daily - she has a supply at home from previous use.  Encouraged she schedule eye exam.  Foot exam today.

## 2020-10-31 NOTE — Assessment & Plan Note (Signed)
Overdue for eye exam - encouraged she call and schedule

## 2020-10-31 NOTE — Assessment & Plan Note (Signed)
Improved by latest [place] 01/2020 however I still hear significant bruit. Continue to monitor.

## 2020-10-31 NOTE — Assessment & Plan Note (Signed)
Mild carotid stenosis (1-39%), h/o L SCa stenosis however latest carotid US 01/2020 returned with normal [place] flow bilaterally - will be due for rpt [place] 01/2021

## 2020-10-31 NOTE — Assessment & Plan Note (Signed)
Update renal panel 

## 2020-10-31 NOTE — Assessment & Plan Note (Signed)
Overdue for GI f/u - I gave pt # to call and schedule f/u appt.  Fib-4 score = 1.4 - low risk for fibrosis.  MELD-Na score: 10 at 10/30/2020  3:22 PM MELD score: 10 at 10/30/2020  3:22 PM Calculated from: Serum Creatinine: 1.43 mg/dL at 10/30/2020  3:22 PM Serum Sodium: 140 mmol/L (Using max of 137 mmol/L) at 10/30/2020  3:22 PM Total Bilirubin: 0.3 mg/dL (Using min of 1 mg/dL) at 10/30/2020  3:22 PM INR(ratio): 1.0 at 10/30/2020  3:22 PM Age: 75 years

## 2020-11-03 ENCOUNTER — Telehealth: Payer: Self-pay | Admitting: Family Medicine

## 2020-11-03 NOTE — Telephone Encounter (Signed)
Pt called returning a call about lab results  

## 2020-11-03 NOTE — Telephone Encounter (Signed)
Completed in result notes.

## 2020-11-27 ENCOUNTER — Telehealth: Payer: Self-pay

## 2020-11-27 NOTE — Chronic Care Management (AMB) (Addendum)
Chronic Care Management Pharmacy Assistant   Name: Cheryl Bird  MRN: [id number] DOB: Jun 11, 1945  Reason for Encounter: Diabetes Review  Recent office visits:  10/30/20- Dr.Gutierrez PCP- Patient presented for follow up diabetes. Labs ordered. Restarted Januvia '50mg'$  take 1 tablet daily. Follow up 3 months.  Recent consult visits:  None since last CCM contact  Hospital visits:  None in previous 6 months  Medications: Outpatient Encounter Medications as of 11/27/2020  Medication Sig   albuterol (VENTOLIN HFA) 108 (90 Base) MCG/ACT inhaler TAKE 2 PUFFS BY MOUTH EVERY 6 HOURS AS NEEDED FOR WHEEZE   amLODipine (NORVASC) 5 MG tablet Take 0.5 tablets (2.5 mg total) by mouth daily.   aspirin EC 81 MG tablet Take 1 tablet (81 mg total) by mouth every other day.   atorvastatin (LIPITOR) 20 MG tablet TAKE 1 TABLET (20 MG TOTAL) BY MOUTH DAILY AT 6 PM.   D3-50 1.25 MG (50000 UT) capsule TAKE 1 CAPULE BY MOUTH ONE TIME PER WEEK   FARXIGA 5 MG TABS tablet TAKE 1 TABLET BY MOUTH DAILY BEFORE BREAKFAST.   ipratropium (ATROVENT) 0.03 % nasal spray SMARTSIG:1-2 Spray(s) Both Nares 3 Times Daily PRN   metFORMIN (GLUCOPHAGE) 1000 MG tablet Take 0.5 tablets (500 mg total) by mouth 2 (two) times daily with a meal.   omeprazole (PRILOSEC) 20 MG capsule Take 1 capsule (20 mg total) by mouth daily.   oxybutynin (DITROPAN) 5 MG tablet TAKE 1 TABLET (5 MG TOTAL) BY MOUTH 2 (TWO) TIMES DAILY AS NEEDED (INCONTINENCE).   sitaGLIPtin (JANUVIA) 50 MG tablet Take 1 tablet (50 mg total) by mouth daily.   No facility-administered encounter medications on file as of 11/27/2020.    Recent Relevant Labs: Lab Results  Component Value Date/Time   HGBA1C 8.4 (A) 10/30/2020 02:28 PM   HGBA1C 5.8 (A) 06/29/2020 11:08 AM   HGBA1C 7.3 (H) 11/26/2019 09:30 AM   HGBA1C 7.2 (H) 06/08/2018 09:21 AM   MICROALBUR 90.7 (H) 11/26/2019 10:31 AM   MICROALBUR 313.7 (H) 06/08/2018 09:21 AM    Kidney Function Lab Results   Component Value Date/Time   CREATININE 1.43 (H) 10/30/2020 03:22 PM   CREATININE 1.34 (H) 07/31/2020 02:27 PM   CREATININE 1.29 (H) 06/29/2020 12:24 PM   CREATININE 1.02 05/07/2007 12:00 AM   GFR 38.95 (L) 07/31/2020 02:27 PM   GFRNONAA 33 (L) 08/25/2017 04:50 PM   GFRAA 38 (L) 08/25/2017 04:50 PM   Attempted contact with [name] on 11/27/20, 12/01/20, and 12/02/20 to discuss diabetes. Unsuccessful outreach. Will attempt contact next month.   Current antihyperglycemic regimen:  Metformin '1000mg'$  take 1/2 tablet 2 times daily  Farxiga  '5mg'$   take 1 tablet before breakfast Januvia '50mg'$  take 1 tablet daily      What recent interventions/DTPs have been made to improve glycemic control:    10/30/20 PCP restarted Januvia '50mg'$  take 1 tablet daily    Adherence Review: Is the patient currently on a STATIN medication? Yes Is the patient currently on ACE/ARB medication? No Does the patient have >5 day gap between last estimated fill dates? No  Care Gaps: Last annual wellness visit: 12/31/2019 Last eye exam / retinopathy screening: PCP encouraged patient to schedule 10/30/20 Last diabetic foot exam: 10/30/20   Star Rating Drugs:  Medication:  Last Fill: Day Supply Januvia '50mg'$   10/05/20 90 Farxiga '5mg'$   11/21/20 30 Metformin '1000mg'$  11/24/20  90 Atorvastatin '20mg'$  11/05/20 90  PCP appointment on 02/01/21 and CCM appointment on 02/08/21  [name], CPP notified  Avel Sensor, [place] Assistant [phone]  I have reviewed the care management and care coordination activities outlined in this encounter and I am certifying that I agree with the content of this note. No further action required.  [name], PharmD Clinical Pharmacist [facility] at [facility] [phone]

## 2020-12-01 ENCOUNTER — Telehealth: Payer: Self-pay

## 2020-12-01 NOTE — Telephone Encounter (Signed)
Received faxed order from [facility] for DM supplies.  Placed form in Dr. Synthia Innocent box.

## 2020-12-02 NOTE — Telephone Encounter (Signed)
Filled and in Lisa's box 

## 2020-12-02 NOTE — Telephone Encounter (Signed)
Faxed order

## 2021-01-03 ENCOUNTER — Other Ambulatory Visit: Payer: Self-pay | Admitting: Family Medicine

## 2021-02-01 ENCOUNTER — Telehealth: Payer: Self-pay

## 2021-02-01 ENCOUNTER — Ambulatory Visit: Payer: Medicare Other | Admitting: Family Medicine

## 2021-02-01 NOTE — Chronic Care Management (AMB) (Addendum)
    Chronic Care Management Pharmacy Assistant   Name: [name]  MRN: [id number] DOB: 07/09/1945   Reason for Encounter: Reminder Call   Conditions to be addressed/monitored: CAD, HTN, HLD, COPD, DMII, and CKD Stage 3    Medications: Outpatient Encounter Medications as of 02/01/2021  Medication Sig   albuterol (VENTOLIN HFA) 108 (90 Base) MCG/ACT inhaler TAKE 2 PUFFS BY MOUTH EVERY 6 HOURS AS NEEDED FOR WHEEZE   amLODipine (NORVASC) 5 MG tablet Take 0.5 tablets (2.5 mg total) by mouth daily.   aspirin EC 81 MG tablet Take 1 tablet (81 mg total) by mouth every other day.   atorvastatin (LIPITOR) 20 MG tablet TAKE 1 TABLET (20 MG TOTAL) BY MOUTH DAILY AT 6 PM.   D3-50 1.25 MG (50000 UT) capsule TAKE 1 CAPULE BY MOUTH ONE TIME PER WEEK   FARXIGA 5 MG TABS tablet TAKE 1 TABLET BY MOUTH DAILY BEFORE BREAKFAST.   ipratropium (ATROVENT) 0.03 % nasal spray SMARTSIG:1-2 Spray(s) Both Nares 3 Times Daily PRN   JANUVIA 50 MG tablet TAKE 1 TABLET BY MOUTH EVERY DAY   metFORMIN (GLUCOPHAGE) 1000 MG tablet Take 0.5 tablets (500 mg total) by mouth 2 (two) times daily with a meal.   omeprazole (PRILOSEC) 20 MG capsule Take 1 capsule (20 mg total) by mouth daily.   oxybutynin (DITROPAN) 5 MG tablet TAKE 1 TABLET (5 MG TOTAL) BY MOUTH 2 (TWO) TIMES DAILY AS NEEDED (INCONTINENCE).   No facility-administered encounter medications on file as of 02/01/2021.   Cheryl Bird  did not answer the telephone to remind her of her upcoming telephone visit with [name] on 02/08/21 at 8:30am. Patient was reminded to have all medications, supplements and any blood glucose and blood pressure readings available for review at appointment. Left voicemail.   Star Rating Drugs: Medication:  Last Fill: Day Supply Januvia 50mg   01/05/21 90 Farxiga 5mg   01/16/21 30 Metformin 1000mg  12/28/20  90 Atorvastatin 20mg  11/05/20 [place], CPP notified  Avel Sensor, [place]  Assistant [phone]  I have reviewed the care management and care coordination activities outlined in this encounter and I am certifying that I agree with the content of this note. No further action required.  [name], PharmD Clinical Pharmacist [facility] at [facility] [phone]

## 2021-02-04 ENCOUNTER — Other Ambulatory Visit: Payer: Self-pay | Admitting: Family Medicine

## 2021-02-08 ENCOUNTER — Other Ambulatory Visit: Payer: Self-pay

## 2021-02-08 ENCOUNTER — Ambulatory Visit (INDEPENDENT_AMBULATORY_CARE_PROVIDER_SITE_OTHER): Payer: Medicare Other

## 2021-02-08 DIAGNOSIS — J449 Chronic obstructive pulmonary disease, unspecified: Secondary | ICD-10-CM

## 2021-02-08 DIAGNOSIS — E1169 Type 2 diabetes mellitus with other specified complication: Secondary | ICD-10-CM

## 2021-02-08 DIAGNOSIS — E118 Type 2 diabetes mellitus with unspecified complications: Secondary | ICD-10-CM

## 2021-02-08 DIAGNOSIS — E785 Hyperlipidemia, unspecified: Secondary | ICD-10-CM

## 2021-02-08 DIAGNOSIS — I10 Essential (primary) hypertension: Secondary | ICD-10-CM

## 2021-02-08 NOTE — Progress Notes (Signed)
Chronic Care Management Pharmacy Note  02/08/2021 Name:  Cheryl Bird MRN:  [id number] DOB:  29-May-1945  Summary: Home BG are within goals now since restarting Januvia in July. She continues metformin, [place]. She scheduled her diabetic eye exam for Feb 2022. Foot exam up to date. States she will call GI today to schedule follow up as recommended by PCP. Encouraged her to take her omeprazole daily instead of PRN due to Barrett's.  Recommendations: No med changes  Plan: PCP visit scheduled 04/13/21 CCM in 12 months  Subjective: Cheryl Bird is an 75 y.o. year old female who is a primary patient of Ria Bush, MD.  The CCM team was consulted for assistance with disease management and care coordination needs.    Engaged with patient by telephone for follow up visit in response to provider referral for pharmacy case management and/or care coordination services.   Consent to Services:  The patient was given information about Chronic Care Management services, agreed to services, and gave verbal consent prior to initiation of services.  Please see initial visit note for detailed documentation.   Patient Care Team: Ria Bush, MD as PCP - General (Family Medicine) Estill Cotta, MD as Consulting Physician (Ophthalmology) Lollie Sails, MD (Inactive) as Consulting Physician (Gastroenterology) Ok Edwards, NP as Nurse Practitioner (Gastroenterology) [name], [facility] as Pharmacist (Pharmacist)  Recent office visits:  10/30/20- Dr.Gutierrez PCP- Patient presented for follow up diabetes. Labs ordered. A1c increased to 8.4%. Restarted Januvia 50 mg take 1 tablet daily. Schedule eye exam. Call [facility] to schedule follow up visit [phone]). Follow up 3 months.   Recent consult visits:  None since last CCM contact   Hospital visits:  None in previous 6 months  Objective:  Lab Results  Component Value Date   CREATININE 1.43 (H)  10/30/2020   BUN 18 10/30/2020   GFR 38.95 (L) 07/31/2020   GFRNONAA 33 (L) 08/25/2017   GFRAA 38 (L) 08/25/2017   NA 140 10/30/2020   K 5.0 10/30/2020   CALCIUM 9.2 10/30/2020   CO2 24 10/30/2020   GLUCOSE 154 (H) 10/30/2020    Lab Results  Component Value Date/Time   HGBA1C 8.4 (A) 10/30/2020 02:28 PM   HGBA1C 5.8 (A) 06/29/2020 11:08 AM   HGBA1C 7.3 (H) 11/26/2019 09:30 AM   HGBA1C 7.2 (H) 06/08/2018 09:21 AM   GFR 38.95 (L) 07/31/2020 02:27 PM   GFR 40.80 (L) 06/29/2020 12:24 PM   MICROALBUR 90.7 (H) 11/26/2019 10:31 AM   MICROALBUR 313.7 (H) 06/08/2018 09:21 AM    Last diabetic Eye exam:  Lab Results  Component Value Date/Time   HMDIABEYEEXA Retinopathy (A) 06/24/2019 12:00 AM    Last diabetic Foot exam:  10/30/20 PCP  Lab Results  Component Value Date   CHOL 150 11/26/2019   HDL 71.60 11/26/2019   LDLCALC 61 11/26/2019   LDLDIRECT 71.0 11/25/2016   TRIG 88.0 11/26/2019   CHOLHDL 2 11/26/2019    Hepatic Function Latest Ref Rng & Units 10/30/2020 07/31/2020 06/29/2020  Total Protein 6.1 - 8.1 g/dL 6.1 - -  Albumin 3.5 - 5.2 g/dL - 3.5 3.4(L)  AST 10 - 35 U/L 24 - -  ALT 6 - 29 U/L 16 - -  Alk Phosphatase 39 - 117 U/L - - -  Total Bilirubin 0.2 - 1.2 mg/dL 0.3 - -  Bilirubin, Direct 0.0 - 0.3 mg/dL - - -    Lab Results  Component Value Date/Time   TSH 4.10 07/31/2020  02:27 PM   TSH 4.07 11/26/2019 09:30 AM   FREET4 0.61 07/31/2020 02:27 PM   FREET4 0.74 11/26/2019 09:30 AM    CBC Latest Ref Rng & Units 10/30/2020 11/26/2019 01/28/2019  WBC 3.8 - 10.8 Thousand/uL 8.6 7.8 8.4  Hemoglobin 11.7 - 15.5 g/dL 9.0(L) 11.4(L) 11.8(L)  Hematocrit 35.0 - 45.0 % 29.1(L) 35.6(L) 36.6  Platelets 140 - 400 Thousand/uL 320 264.0 253.0    Lab Results  Component Value Date/Time   VD25OH 44.87 11/26/2019 09:30 AM   VD25OH 13.23 (L) 11/29/2018 12:24 PM    Clinical ASCVD: Yes  The 10-year ASCVD risk score (Arnett DK, et al., 2019) is: 47.1%   Values used to calculate the  score:     Age: 8 years     Sex: Female     Is Non-Hispanic African American: No     Diabetic: Yes     Tobacco smoker: Yes     Systolic Blood Pressure: 950 mmHg     Is BP treated: Yes     HDL Cholesterol: 71.6 mg/dL     Total Cholesterol: 150 mg/dL    Depression screen [facility] 2/9 11/26/2019 06/08/2018 05/31/2017  Decreased Interest 0 0 0  Down, Depressed, Hopeless 0 0 0  PHQ - 2 Score 0 0 0  Altered sleeping 0 0 0  Tired, decreased energy 0 0 0  Change in appetite 0 0 0  Feeling bad or failure about yourself  0 0 0  Trouble concentrating 0 0 0  Moving slowly or fidgety/restless 0 0 0  Suicidal thoughts 0 0 0  PHQ-9 Score 0 0 0  Difficult doing work/chores Not difficult at all Not difficult at all Not difficult at all    Social History   Tobacco Use  Smoking Status Some Days   Packs/day: 0.25   Years: 56.00   Pack years: 14.00   Types: Cigarettes   Start date: 04/25/1968  Smokeless Tobacco Never  Tobacco Comments   smokes 1 pack week currently   BP Readings from Last 3 Encounters:  10/30/20 126/64  07/31/20 136/86  06/29/20 122/60   Pulse Readings from Last 3 Encounters:  10/30/20 97  07/31/20 91  06/29/20 88   Wt Readings from Last 3 Encounters:  10/30/20 124 lb 8 oz (56.5 kg)  07/31/20 126 lb 4 oz (57.3 kg)  06/29/20 128 lb 3 oz (58.1 kg)   BMI Readings from Last 3 Encounters:  10/30/20 23.52 kg/m  07/31/20 23.85 kg/m  06/29/20 24.22 kg/m    Assessment/Interventions: Review of patient past medical history, allergies, medications, health status, including review of consultants reports, laboratory and other test data, was performed as part of comprehensive evaluation and provision of chronic care management services.   SDOH:  (Social Determinants of Health) assessments and interventions performed: Yes   SDOH Screenings   Alcohol Screen: Not on file  Depression (PHQ2-9): Not on file  Financial Resource Strain: Low Risk    Difficulty of Paying Living  Expenses: Not very hard  Food Insecurity: Not on file  Housing: Not on file  Physical Activity: Not on file  Social Connections: Not on file  Stress: Not on file  Tobacco Use: High Risk   Smoking Tobacco Use: Some Days   Smokeless Tobacco Use: Never  Transportation Needs: Not on file    CCM Care Plan  Allergies  Allergen Reactions   Ace Inhibitors Other (See Comments)    Hyperkalemia (even on 2.50m lisinopril), bump in creatinine  Aleve [Naproxen Sodium] Itching    Medications Reviewed Today     Reviewed by [name], [facility] (Pharmacist) on 02/08/21 at 0903  Med List Status: <None>   Medication Order Taking? Sig Documenting Provider Last Dose Status Informant  albuterol (VENTOLIN HFA) 108 (90 Base) MCG/ACT inhaler [id number] Yes TAKE 2 PUFFS BY MOUTH EVERY 6 HOURS AS NEEDED FOR WHEEZE Ria Bush, MD Taking Active   amLODipine (NORVASC) 5 MG tablet [id number] Yes Take 0.5 tablets (2.5 mg total) by mouth daily. Ria Bush, MD Taking Active   aspirin EC 81 MG tablet [id number] Yes Take 1 tablet (81 mg total) by mouth every other day. Ria Bush, MD Taking Active   atorvastatin (LIPITOR) 20 MG tablet [id number] Yes TAKE 1 TABLET (20 MG TOTAL) BY MOUTH DAILY AT 6 PM. Ria Bush, MD Taking Active   D3-50 1.25 MG (50000 UT) capsule [id number] Yes TAKE 1 CAPULE BY MOUTH ONE TIME PER WEEK Ria Bush, MD Taking Active   FARXIGA 5 MG TABS tablet [id number] Yes TAKE 1 TABLET BY MOUTH DAILY BEFORE BREAKFAST. Ria Bush, MD Taking Active   ipratropium (ATROVENT) 0.03 % nasal spray [id number] Yes SMARTSIG:1-2 Spray(s) Both Nares 3 Times Daily PRN [provider] Taking Active   JANUVIA 50 MG tablet [id number] Yes TAKE 1 TABLET BY MOUTH EVERY DAY Ria Bush, MD Taking Active   metFORMIN (GLUCOPHAGE) 1000 MG tablet [id number] Yes Take 0.5 tablets (500 mg total) by mouth 2 (two) times daily with a meal. Ria Bush, MD Taking Active    omeprazole (PRILOSEC) 20 MG capsule [id number] Yes Take 20 mg by mouth daily. She takes this PRN, about twice a week. Ria Bush, MD Taking Active Self  oxybutynin (DITROPAN) 5 MG tablet [id number] Yes TAKE 1 TABLET (5 MG TOTAL) BY MOUTH 2 (TWO) TIMES DAILY AS NEEDED (INCONTINENCE).  Patient taking differently: Take 5 mg by mouth 2 (two) times daily as needed (incontinence). She takes this once daily - 02/08/21   Ria Bush, MD Taking Active             Patient Active Problem List   Diagnosis Date Noted   Pedal edema 06/29/2020   Hearing loss 01/02/2020   Cirrhosis of liver ([place]) 01/20/2019   Subclavian artery stenosis, left ([place]) 12/22/2018   Vitamin D deficiency 11/29/2018   Bilateral serous otitis media 05/03/2018   Emphysema of lung ([place]) 12/27/2017   Right hand pain 10/15/2017   Hyperkalemia 09/19/2017   TIA (transient ischemic attack) 08/29/2017   Aortic valve sclerosis 08/29/2017   CKD stage 3 due to type 2 diabetes mellitus ([place]) 06/05/2017   CAD (coronary artery disease) 12/10/2016   Thoracic aortic atherosclerosis ([place]) 12/10/2016   Carotid stenosis 06/04/2016   Background diabetic retinopathy ([place]) 02/01/2016   Urinary incontinence, mixed 11/30/2015   Health maintenance examination 06/02/2015   Osteopenia 05/27/2015   Advanced care planning/counseling discussion 05/28/2014   Abnormal TSH 05/28/2014   Rhinorrhea 01/09/2014   COPD, moderate ([employer]) 02/12/2013   HTN (hypertension)    GERD (gastroesophageal reflux disease)    Barrett esophagus 02/24/2012   Medicare annual wellness visit, subsequent 12/24/2011   Diabetes mellitus type 2, uncontrolled, with complications    Hyperlipidemia associated with type 2 diabetes mellitus ([place])    Smoker     Immunization History  Administered Date(s) Administered   Fluad Quad(high Dose 65+) 01/28/2019, 12/31/2019   Influenza Split 03/26/2012   Influenza,inj,Quad PF,6+ Mos 01/04/2013, 01/09/2014, 06/02/2015,  05/27/2016, 05/31/2017, 03/06/2018   PFIZER(Purple Top)SARS-COV-2 Vaccination 08/20/2019,  09/10/2019   Pneumococcal Conjugate-13 01/09/2014   Pneumococcal Polysaccharide-23 12/23/2011   Td 12/23/2011    Conditions to be addressed/monitored:  Hypertension, Hyperlipidemia, Diabetes and COPD  Care Plan : [place]  Updates made by [name], [place] since 02/08/2021 12:00 AM     Problem: CHL AMB "PATIENT-SPECIFIC PROBLEM"      Long-Range Goal: Patient Stated   Start Date: 08/11/2020  This Visit's Progress: On track  Priority: High  Note:   Current Barriers:  Due for annual diabetic eye exam - she has this scheduled for February 2022  Pharmacist Clinical Goal(s):  Patient will contact provider office for questions/concerns as evidenced notation of same in electronic health record through collaboration with PharmD and provider.   Interventions: 1:1 collaboration with Ria Bush, MD regarding development and update of comprehensive plan of care as evidenced by provider attestation and co-signature Inter-disciplinary care team collaboration (see longitudinal plan of care) Comprehensive medication review performed; medication list updated in electronic medical record  Hypertension (BP goal <140/90) -Controlled -  BP controlled in clinic -Current treatment: Amlodipine 5 mg - 1/2 (2.5 mg) tablet daily  -Medications previously tried: lisinopril - hyperkalemia; amlodipine dose reduced to 2.5 mg March 2022, minimal improvement in swelling with dose reduction but BP has remained stable/controlled, reports wearing compression socks for swelling after a lot of walking, helps some  -Current home readings: she has not checked it home recently -Current exercise habits: walking at [facility]  -Denies cough/cold products or NSAIDs, only takes aspirin 81 mg OTC -Denies hypotensive/hypertensive symptoms. No dizziness or falls. Past history: -Current dietary habits: watches salt   -Caffeine: 2 cups coffee in morning (she has weaned down to this) -Salt: Denies uses saltshaker, unless eating tomatoes; eats out maybe once a week   -Drinks: Diet mountain dew every now and then, mainly water  -Counseled to monitor BP at home with symptoms, document, and provide log at future appointments -Recommended to continue current medication; Check BP at home twice a month.  Hyperlipidemia/CAD: (LDL goal < 70) -Controlled - LDL 61, adherence is good. No other changes 02/08/21 -Current treatment: Atorvastatin 20 mg - 1 tablet daily  Aspirin 81 mg - 1 tablet every other daily  -Medications previously tried: none -Educated on Cholesterol goals;  -Recommended to continue current medication  Diabetes (A1c goal <7%) -Query controlled - A1c 8.4% 10/2020 (Home readings improving and within goal) -CKD - GFR 39 ml/min  -Current medications: Metformin 1000 mg - 1/2 tablet in morning and 1/2 tablet at night Farxiga 5 mg - 1 tablet daily (started 03/22) Januvia 50 mg - 1 tablet daily (restarted 07/22) -Medications previously tried: none  -Current home glucose readings - checking blood glucose twice daily, morning and bedtime fasting glucose: 104 (this morning) bedtime:  145 (last night) She reports no readings in 200s recently. None less than 70. -Denies hypoglycemic/hyperglycemic symptoms -Counseled to check feet daily and get yearly eye exams - up to date on foot exam, due for eye exam -Recommended to continue current medication; Pt has scheduled her annual eye exam - 05/27/20   COPD (Goal: control symptoms and prevent exacerbations) -Controlled - pt reports symptoms controlled, no recent exacerbations No update/changes 02/08/21 - she continues to use albuterol about twice/day for wheezing -She does use albuterol inhaler most days -Difficulty walking 200 ft without stopping to rest. -Current treatment  Albuterol inhaler - 2 puffs as needed -Medications previously tried: none   -Gold Grade: Gold 2 (FEV1 50-79%) -Pulmonary function testing:  2014 - FEV1 59% -Exacerbations requiring treatment in last 6 months: none -Patient denies consistent use of maintenance inhaler - none prescribed, no history of maintenance inhaler use -Frequency of rescue inhaler use: uses at breakfast and night most days -Tobacco use: 1 pack lasts 2-3 weeks, smokes primarily for anxiety/nerves  -Recommended to continue current medication; Patient to call if interested in tobacco cessation support   PRNs: Oxybutynin - takes once daily at noon (suggest taking around bedtime due to daytime drowsiness/her symptoms worse at night) Omeprazole - takes about twice a week, as needed  (suggested taking routinely due to history of Barrett's, pt reports she knows her body and takes when she feels burning in throat only, but she will try to take more often Albuterol - takes about twice/day (morning and night) for wheezing   Patient Goals/Self-Care Activities Patient will:  - take medications as prescribed -check glucose daily, document, and provide at future appointments -check blood pressure twice/month and provide at future appointments -schedule GI follow up [facility] (She states she will call GI today to schedule follow up. She has the phone number.)  Follow Up Plan: Telephone follow up appointment with care management team member scheduled for: 12 months         Medication Assistance: None required.  Patient affirms current coverage meets needs.  Star Rating Drugs: Medication:                Last Fill:         Day Supply Januvia 73m              01/05/21            90 Farxiga 594m               01/16/21            30 Metformin 100042m    12/28/20              90 Atorvastatin 54m33m    11/05/20            90  Reviewed - no gaps in adherence  Patient's preferred pharmacy is:  [id number] [address] [id number]: [phone]  [phone] up meds [facility]  [facility] pill box? No - tries to take medications at the same time (7 AM) every morning, evening doses around 5-6 PM, uses a pillbox if going out of town, otherwise, keeps everything on living room table, refills timely, able to state name of medications Pt endorses 100% compliance  We discussed: Benefits of medication synchronization, packaging and delivery as well as enhanced pharmacist oversight with [facility]. Patient likes [facility]. Reports they will deliver if needed.  Patient decided to: Continue current medication management strategy  Care Plan and Follow Up Patient Decision:  Patient agrees to Care Plan and Follow-up.  MicheDebbora DusrmD Clinical Pharmacist [facility] [phone]

## 2021-02-08 NOTE — Patient Instructions (Signed)
Dear Cheryl Bird,  Below is a summary of the goals we discussed during our follow up appointment on February 08, 2021. Please contact me anytime with questions or concerns.   Visit Information  Patient Care Plan: CCM Pharmacy Care Plan     Problem Identified: CHL AMB "PATIENT-SPECIFIC PROBLEM"      Long-Range Goal: Patient Stated   Start Date: 08/11/2020  This Visit's Progress: On track  Priority: High  Note:   Current Barriers:  Due for annual diabetic eye exam - she has this scheduled for February 2022  Pharmacist Clinical Goal(s):  Patient will contact provider office for questions/concerns as evidenced notation of same in electronic health record through collaboration with PharmD and provider.   Interventions: 1:1 collaboration with Ria Bush, MD regarding development and update of comprehensive plan of care as evidenced by provider attestation and co-signature Inter-disciplinary care team collaboration (see longitudinal plan of care) Comprehensive medication review performed; medication list updated in electronic medical record  Hypertension (BP goal <140/90) -Controlled -  BP controlled in clinic -Current treatment: Amlodipine 5 mg - 1/2 (2.5 mg) tablet daily  -Medications previously tried: lisinopril - hyperkalemia; amlodipine dose reduced to 2.5 mg March 2022, minimal improvement in swelling with dose reduction but BP has remained stable/controlled, reports wearing compression socks for swelling after a lot of walking, helps some  -Current home readings: she has not checked it home recently -Current exercise habits: walking at [facility]  -Denies cough/cold products or NSAIDs, only takes aspirin 81 mg OTC -Denies hypotensive/hypertensive symptoms. No dizziness or falls. Past history: -Current dietary habits: watches salt  -Caffeine: 2 cups coffee in morning (she has weaned down to this) -Salt: Denies uses saltshaker, unless eating tomatoes; eats out maybe once a  week   -Drinks: Diet mountain dew every now and then, mainly water  -Counseled to monitor BP at home with symptoms, document, and provide log at future appointments -Recommended to continue current medication; Check BP at home twice a month.  Hyperlipidemia/CAD: (LDL goal < 70) -Controlled - LDL 61, adherence is good. No other changes 02/08/21 -Current treatment: Atorvastatin 20 mg - 1 tablet daily  Aspirin 81 mg - 1 tablet every other daily  -Medications previously tried: none -Educated on Cholesterol goals;  -Recommended to continue current medication  Diabetes (A1c goal <7%) -Query controlled - A1c 8.4% 10/2020 (Home readings improving and within goal) -CKD - GFR 39 ml/min  -Current medications: Metformin 1000 mg - 1/2 tablet in morning and 1/2 tablet at night Farxiga 5 mg - 1 tablet daily (started 03/22) Januvia 50 mg - 1 tablet daily (restarted 07/22) -Medications previously tried: none  -Current home glucose readings - checking blood glucose twice daily, morning and bedtime fasting glucose: 104 (this morning) bedtime:  145 (last night) She reports no readings in 200s recently. None less than 70. -Denies hypoglycemic/hyperglycemic symptoms -Counseled to check feet daily and get yearly eye exams - up to date on foot exam, due for eye exam -Recommended to continue current medication; Pt has scheduled her annual eye exam - 05/27/20   COPD (Goal: control symptoms and prevent exacerbations) -Controlled - pt reports symptoms controlled, no recent exacerbations No update/changes 02/08/21 - she continues to use albuterol about twice/day for wheezing -She does use albuterol inhaler most days -Difficulty walking 200 ft without stopping to rest. -Current treatment  Albuterol inhaler - 2 puffs as needed -Medications previously tried: none  -Gold Grade: Gold 2 (FEV1 50-79%) -Pulmonary function testing: 2014 - FEV1 59% -  Exacerbations requiring treatment in last 6 months: none -Patient  denies consistent use of maintenance inhaler - none prescribed, no history of maintenance inhaler use -Frequency of rescue inhaler use: uses at breakfast and night most days -Tobacco use: 1 pack lasts 2-3 weeks, smokes primarily for anxiety/nerves  -Recommended to continue current medication; Patient to call if interested in tobacco cessation support   PRNs: Oxybutynin - takes once daily at noon (suggest taking around bedtime due to daytime drowsiness/her symptoms worse at night) Omeprazole - takes about twice a week, as needed  (suggested taking routinely due to history of Barrett's, pt reports she knows her body and takes when she feels burning in throat only, but she will try to take more often Albuterol - takes about twice/day (morning and night) for wheezing   Patient Goals/Self-Care Activities Patient will:  - take medications as prescribed -check glucose daily, document, and provide at future appointments -check blood pressure twice/month and provide at future appointments -schedule GI follow up [facility] (She states she will call GI today to schedule follow up. She has the phone number.)  Follow Up Plan: Telephone follow up appointment with care management team member scheduled for: 12 months       Patient verbalizes understanding of instructions provided today and agrees to view in [place].   [name], PharmD Clinical Pharmacist [facility] at [facility] [phone]

## 2021-02-10 ENCOUNTER — Telehealth: Payer: Medicare Other

## 2021-02-22 DIAGNOSIS — I10 Essential (primary) hypertension: Secondary | ICD-10-CM

## 2021-02-22 DIAGNOSIS — E1169 Type 2 diabetes mellitus with other specified complication: Secondary | ICD-10-CM | POA: Diagnosis not present

## 2021-02-22 DIAGNOSIS — E785 Hyperlipidemia, unspecified: Secondary | ICD-10-CM | POA: Diagnosis not present

## 2021-02-22 DIAGNOSIS — J449 Chronic obstructive pulmonary disease, unspecified: Secondary | ICD-10-CM | POA: Diagnosis not present

## 2021-02-22 DIAGNOSIS — E118 Type 2 diabetes mellitus with unspecified complications: Secondary | ICD-10-CM | POA: Diagnosis not present

## 2021-02-28 ENCOUNTER — Other Ambulatory Visit: Payer: Self-pay | Admitting: Family Medicine

## 2021-03-18 ENCOUNTER — Other Ambulatory Visit: Payer: Self-pay | Admitting: Family Medicine

## 2021-03-28 ENCOUNTER — Other Ambulatory Visit: Payer: Self-pay | Admitting: Family Medicine

## 2021-04-02 ENCOUNTER — Encounter: Payer: Self-pay | Admitting: Family Medicine

## 2021-04-02 ENCOUNTER — Other Ambulatory Visit: Payer: Self-pay | Admitting: Family Medicine

## 2021-04-02 NOTE — Telephone Encounter (Signed)
Lvm for pt to call office to schedule AWV

## 2021-04-02 NOTE — Telephone Encounter (Signed)
Also mailed a letter to call office to schedule AWV

## 2021-04-02 NOTE — Telephone Encounter (Signed)
E-scribed refill.  Plz schedule wellness, lab and cpe visits.  

## 2021-04-07 ENCOUNTER — Telehealth: Payer: Self-pay | Admitting: Family Medicine

## 2021-04-07 NOTE — Telephone Encounter (Signed)
Officer King with [employer] station called stating that pt died today (2021/05/06) at 2:32 pm. Officer Edison Pace stated if Dr Darnell Level would signed the Death Certificate. Please advise.

## 2021-04-07 NOTE — Telephone Encounter (Signed)
[facility] PD, spoke with officer Edison Pace. Pt wasn't feeling well for last few days, went to stay with daughter today, found unresponsive. EMS called, were unable to regain pulse.   [place],Sherry Daughter [phone]

## 2021-04-07 NOTE — Telephone Encounter (Signed)
Spoke with daughter Judeen Hammans.  She had been feeling weak over last few days. No chest pain but breathing was labored.  Was with daughter and SIL when she became unresponsive then didn't regain consciousness despite CPR.  Offered my condolences. Daughter appreciative.

## 2021-04-13 ENCOUNTER — Ambulatory Visit: Payer: Medicare Other | Admitting: Family Medicine

## 2021-04-24 ENCOUNTER — Other Ambulatory Visit: Payer: Self-pay | Admitting: Family Medicine

## 2021-04-25 DEATH — deceased

## 2021-05-02 ENCOUNTER — Other Ambulatory Visit: Payer: Self-pay | Admitting: Family Medicine

## 2021-06-03 ENCOUNTER — Other Ambulatory Visit: Payer: Self-pay | Admitting: Family Medicine

## 2022-02-08 ENCOUNTER — Telehealth: Payer: Medicare Other
